# Patient Record
Sex: Male | Born: 1950 | Race: White | Hispanic: No | Marital: Married | Smoking: Never smoker
Health system: Southern US, Community
[De-identification: ages and names within clinical notes are randomized; demographics above are authoritative.]

## PROBLEM LIST (undated history)

## (undated) DIAGNOSIS — E785 Hyperlipidemia, unspecified: Secondary | ICD-10-CM

## (undated) DIAGNOSIS — E119 Type 2 diabetes mellitus without complications: Secondary | ICD-10-CM

## (undated) DIAGNOSIS — G4733 Obstructive sleep apnea (adult) (pediatric): Secondary | ICD-10-CM

## (undated) DIAGNOSIS — K219 Gastro-esophageal reflux disease without esophagitis: Secondary | ICD-10-CM

## (undated) DIAGNOSIS — Z87442 Personal history of urinary calculi: Secondary | ICD-10-CM

## (undated) DIAGNOSIS — F419 Anxiety disorder, unspecified: Secondary | ICD-10-CM

## (undated) DIAGNOSIS — Z9989 Dependence on other enabling machines and devices: Secondary | ICD-10-CM

## (undated) DIAGNOSIS — L409 Psoriasis, unspecified: Secondary | ICD-10-CM

## (undated) DIAGNOSIS — R011 Cardiac murmur, unspecified: Secondary | ICD-10-CM

## (undated) DIAGNOSIS — I10 Essential (primary) hypertension: Secondary | ICD-10-CM

## (undated) DIAGNOSIS — J309 Allergic rhinitis, unspecified: Secondary | ICD-10-CM

## (undated) DIAGNOSIS — R51 Headache: Secondary | ICD-10-CM

## (undated) DIAGNOSIS — M1990 Unspecified osteoarthritis, unspecified site: Secondary | ICD-10-CM

## (undated) DIAGNOSIS — H3530 Unspecified macular degeneration: Secondary | ICD-10-CM

## (undated) DIAGNOSIS — I480 Paroxysmal atrial fibrillation: Secondary | ICD-10-CM

## (undated) DIAGNOSIS — R519 Headache, unspecified: Secondary | ICD-10-CM

## (undated) HISTORY — DX: Gastro-esophageal reflux disease without esophagitis: K21.9

## (undated) HISTORY — PX: CATARACT EXTRACTION: SUR2

## (undated) HISTORY — DX: Essential (primary) hypertension: I10

## (undated) HISTORY — DX: Unspecified osteoarthritis, unspecified site: M19.90

## (undated) HISTORY — DX: Headache, unspecified: R51.9

## (undated) HISTORY — PX: NASAL SINUS SURGERY: SHX719

## (undated) HISTORY — PX: EYE SURGERY: SHX253

## (undated) HISTORY — PX: TONSILLECTOMY: SUR1361

## (undated) HISTORY — DX: Allergic rhinitis, unspecified: J30.9

## (undated) HISTORY — DX: Cardiac murmur, unspecified: R01.1

## (undated) HISTORY — DX: Unspecified macular degeneration: H35.30

## (undated) HISTORY — DX: Obstructive sleep apnea (adult) (pediatric): G47.33

## (undated) HISTORY — PX: VASECTOMY: SHX75

## (undated) HISTORY — DX: Psoriasis, unspecified: L40.9

## (undated) HISTORY — DX: Headache: R51

---

## 1989-11-26 HISTORY — PX: NASAL SINUS SURGERY: SHX719

## 2006-09-20 ENCOUNTER — Ambulatory Visit: Payer: Self-pay | Admitting: Internal Medicine

## 2008-12-28 ENCOUNTER — Ambulatory Visit: Payer: Self-pay | Admitting: Ophthalmology

## 2009-01-11 ENCOUNTER — Ambulatory Visit: Payer: Self-pay | Admitting: Ophthalmology

## 2010-04-11 ENCOUNTER — Ambulatory Visit: Payer: Self-pay | Admitting: Ophthalmology

## 2010-04-12 ENCOUNTER — Ambulatory Visit: Payer: Self-pay | Admitting: Cardiovascular Disease

## 2010-04-24 ENCOUNTER — Ambulatory Visit: Payer: Self-pay | Admitting: Ophthalmology

## 2013-06-08 ENCOUNTER — Ambulatory Visit: Payer: Self-pay | Admitting: General Practice

## 2015-08-28 ENCOUNTER — Other Ambulatory Visit: Payer: Self-pay | Admitting: Physician Assistant

## 2015-09-05 ENCOUNTER — Other Ambulatory Visit: Payer: Self-pay | Admitting: Emergency Medicine

## 2015-09-05 MED ORDER — FLUTICASONE PROPIONATE 50 MCG/ACT NA SUSP: 2.0000 | Freq: Every day | NASAL | Status: DC

## 2015-09-05 NOTE — Telephone Encounter (Signed)
Received faxed med request from [facility] [address] for Fluticasone nose spray.

## 2015-09-05 NOTE — Telephone Encounter (Signed)
Please reorder. Thanks

## 2015-09-19 ENCOUNTER — Ambulatory Visit (INDEPENDENT_AMBULATORY_CARE_PROVIDER_SITE_OTHER): Payer: PRIVATE HEALTH INSURANCE | Admitting: Family Medicine

## 2015-09-19 ENCOUNTER — Encounter: Payer: Self-pay | Admitting: Family Medicine

## 2015-09-19 VITALS — BP 116/82 | HR 74 | Temp 97.9°F | Ht 71.85 in | Wt 254.2 lb

## 2015-09-19 DIAGNOSIS — L4050 Arthropathic psoriasis, unspecified: Secondary | ICD-10-CM | POA: Insufficient documentation

## 2015-09-19 DIAGNOSIS — I10 Essential (primary) hypertension: Secondary | ICD-10-CM | POA: Diagnosis not present

## 2015-09-19 DIAGNOSIS — R0602 Shortness of breath: Secondary | ICD-10-CM | POA: Diagnosis not present

## 2015-09-19 DIAGNOSIS — K219 Gastro-esophageal reflux disease without esophagitis: Secondary | ICD-10-CM | POA: Insufficient documentation

## 2015-09-19 DIAGNOSIS — R0600 Dyspnea, unspecified: Secondary | ICD-10-CM | POA: Diagnosis not present

## 2015-09-19 DIAGNOSIS — R42 Dizziness and giddiness: Secondary | ICD-10-CM

## 2015-09-19 DIAGNOSIS — M2550 Pain in unspecified joint: Secondary | ICD-10-CM

## 2015-09-19 MED ORDER — AMLODIPINE BESYLATE 10 MG PO TABS: 10.0000 mg | ORAL_TABLET | Freq: Every day | ORAL | Status: DC

## 2015-09-19 MED ORDER — OMEPRAZOLE 20 MG PO CPDR: 20.0000 mg | DELAYED_RELEASE_CAPSULE | Freq: Every day | ORAL | Status: DC

## 2015-09-19 NOTE — Progress Notes (Signed)
Pre visit review using our clinic review tool, if applicable. No additional management support is needed unless otherwise documented below in the visit note. 

## 2015-09-19 NOTE — Assessment & Plan Note (Signed)
Well-controlled.  Continue omeprazole. 

## 2015-09-19 NOTE — Assessment & Plan Note (Addendum)
At goal. Continue amlodipine 10 mg daily. We'll check CMET.

## 2015-09-19 NOTE — Progress Notes (Signed)
Patient ID: Wayne Scott, male   DOB: Nov 08, 1951, 64 y.o.   MRN: [id number]  Tommi Rumps, MD Phone: [phone]  Wayne Scott is a 64 y.o. male who presents today for new patient visit  HYPERTENSION Disease Monitoring Home BP Monitoring not checking Chest pain- no    Dyspnea- yes, see below Medications Compliance-  taking amlodipine. Lightheadedness-  yes, see below  Edema- no  Dyspnea: Patient notes for several months he has felt like he has to work to breathe intermittently. He notes it occurs when he goes from seated to standing and gets worse after eating. He does not get short of breath with exercise. He feels like it could be worse when he is not having regular bowel movements. He does note some orthopnea. No PND. He occasionally wakes up with the feeling that he hasn't been breathing well at night. He has no history of VTE, anemia, or thyroid issues. He does note a history of a stress test 2-3 years ago that he reports was normal. He does note he has obstructive sleep apnea and wears CPAP nightly. He has no chest pain, dyspnea, or lightheadedness at this time. He denies lower extremity swelling.  Lightheadedness: Patient notes for a long time he has had an issue with lightheadedness that only occurs either after eating or when he waits too long to eat. He does note some intermittent palpitations with this that he describes as a rapid heartbeat. He has no chest pain or shortness of breath with this lightheadedness. He does report a history of prediabetes. These symptoms do not occur when he goes from laying to standing or seated to standing. He has no lightheadedness at this time.  Joint aches: Patient reports persistent scattered joint aches that occur all the time. He cannot pinpoint a specific joint. He notes it feels achy. He denies any joint swelling or erythema. He states he takes 2 Aleve a day for this. Does have a history of psoriasis. His mom does have arthritis. He is  unsure what kind of arthritis she has. He states he has been tested for rheumatoid arthritis and Lyme disease in the past and these were negative.  GERD: this is well controlled. He has no abdominal pain. He has no reflux symptoms or sour taste in the back of his mouth. He takes omeprazole daily. Has not had symptoms in some time.    Active Ambulatory Problems    Diagnosis Date Noted  . Essential hypertension 09/19/2015  . Dyspnea 09/19/2015  . Lightheadedness 09/19/2015  . Joint ache 09/19/2015  . GERD (gastroesophageal reflux disease) 09/19/2015   Resolved Ambulatory Problems    Diagnosis Date Noted  . No Resolved Ambulatory Problems   Past Medical History  Diagnosis Date  . Arthritis   . Headache   . Allergic rhinitis   . Hypertension   . Psoriasis   . OSA (obstructive sleep apnea)   . Macular degeneration     Family History  Problem Relation Age of Onset  . Arthritis      Parent  . Hypertension      Parent  . Diabetes      Parent    Social History   Social History  . Marital Status: Married    Spouse Name: N/A  . Number of Children: N/A  . Years of Education: N/A   Occupational History  . Not on file.   Social History Main Topics  . Smoking status: Never Smoker   . Smokeless tobacco:  Not on file  . Alcohol Use: 0.0 oz/week    0 Standard drinks or equivalent per week  . Drug Use: No  . Sexual Activity: Not on file   Other Topics Concern  . Not on file   Social History Narrative  . 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 CV:  Positive for dyspnea, Negative for chest pain, edema, palpitations Resp: Negative for cough,  hemoptysis GI: Negative for nausea, vomiting, diarrhea, constipation, abdominal pain, melena, hematochezia. GU: Positive for sexual difficulty (has history of ED followed by urology), Negative for dysuria, incontinence, urinary hesitance, hematuria, vaginal or penile discharge, polyuria, lumps in testicle or breasts MSK: Positive for joint pain, Negative for muscle cramps or aches, joint swelling Neuro: Positive for dizziness, Negative for headaches, weakness, numbness, passing out/fainting Psych: Negative for depression, anxiety, memory problems  Objective  Physical Exam Filed Vitals:   09/19/15 0833  BP: 116/82  Pulse: 74  Temp: 97.9 F (36.6 C)   laying blood pressure 114/80 pulse 62 Sitting blood pressure 120/84 pulse 69 Standing blood pressure 128/86 pulse 76  Physical Exam  Constitutional: He is well-developed, well-nourished, and in no distress.  HENT:  Head: Normocephalic and atraumatic.  Right Ear: External ear normal.  Left Ear: External ear normal.  Mouth/Throat: Oropharynx is clear and moist. No oropharyngeal exudate.  Eyes: Conjunctivae are normal. Pupils are equal, round, and reactive to light.  Neck: Neck supple.  Cardiovascular: Normal rate, regular rhythm and normal heart sounds.  Exam reveals no gallop and no friction rub.   No murmur heard. Pulmonary/Chest: Effort normal and breath sounds normal. No respiratory distress. He has no wheezes. He has no rales.  Abdominal: Soft. Bowel sounds are normal. He exhibits no distension. There is no tenderness. There is no rebound and no guarding.  Musculoskeletal: He exhibits no edema.  Lymphadenopathy:    He has no cervical adenopathy.  Neurological: He is alert.  CN 2-12 intact, 5/5 strength in bilateral biceps, triceps, grip, quads, hamstrings, plantar and dorsiflexion, sensation to light touch intact in bilateral UE and LE, normal gait, 2+ patellar reflexes  Skin: Skin is warm and dry. He is not diaphoretic.   Psychiatric: Mood and affect normal.   EKG: Normal sinus rhythm, rate 64, nonspecific ST depression (appears depressed less than 1 mm in V3 and V4 and V5)  Assessment/Plan:   Essential hypertension At goal. Continue amlodipine 10 mg daily. We'll check CMET.  Dyspnea Patient has had several months of dyspnea though no other associated symptoms with this. Could be related to his sleep apnea or deconditioning. Does seem to occur following eating and standing which is atypical. His EKG does have minimal ST depressions though they are less than 1 mm in each instance. He has no symptoms at this time. His vital signs are stable. His lung exam is benign. Unlikely to be an acute pulmonary process or PE given stable vitals and benign lung exam. Given these EKG changes and his reported dyspnea we will refer him to cardiology for further evaluation. If cardiac workup is negative could benefit from seeing pulmonary for evaluation and management of his sleep apnea. He was given return precautions.  Lightheadedness Patient  with lightheadedness that is associated with eating or times when he has not eaten in a while. He had negative orthostatics in the office. His blood pressures in the normal range today. He is neurologically intact today. His vitals are stable. The fact that this is associated with eating or having gone too long without eating makes me wonder if it could be related to his blood glucose. We will check an A1c and CMP. We'll also check a TSH and CBC rule out thyroid and anemia as a cause of this. He will additionally see cardiology.   Joint ache Patient was scattered joint aches reported today. He is unable to pinpoint specific joints that hurt. He notes all his joints hurt. We'll obtain lab work to evaluate this further. Patient notes he has a lab appointment on Wednesday through his wife's work where he had labs for free at [facility] and thus he is given a prescription for a CBC, TSH,  CMP, sedimentation rate, A1c, CRP, HLA-B27, RF, and ANA. He has psoriasis which places him at risk for psoriatic arthritis. We will see what his lab work reveals and then proceed from there. Given return precautions.  GERD (gastroesophageal reflux disease) Well-controlled. Continue omeprazole.  Patient was given a prescription for lab work as outlined above. He'll obtain this on Wednesday.  Orders Placed This Encounter  Procedures  . Ambulatory referral to Cardiology    Referral Priority:  Routine    Referral Type:  Consultation    Referral Reason:  Specialty Services Required    Requested Specialty:  Cardiology    Number of Visits Requested:  1  . EKG 12-Lead    Tommi Rumps

## 2015-09-19 NOTE — Assessment & Plan Note (Addendum)
Patient with lightheadedness that is associated with eating or times when he has not eaten in a while. He had negative orthostatics in the office. His blood pressures in the normal range today. He is neurologically intact today. His vitals are stable. The fact that this is associated with eating or having gone too long without eating makes me wonder if it could be related to his blood glucose. We will check an A1c and CMP. We'll also check a TSH and CBC rule out thyroid and anemia as a cause of this. He will additionally see cardiology. Given return precautions.

## 2015-09-19 NOTE — Assessment & Plan Note (Signed)
Patient has had several months of dyspnea though no other associated symptoms with this. Could be related to his sleep apnea or deconditioning. Does seem to occur following eating and standing which is atypical. His EKG does have minimal ST depressions though they are less than 1 mm in each instance. He has no symptoms at this time. His vital signs are stable. His lung exam is benign. Unlikely to be an acute pulmonary process or PE given stable vitals and benign lung exam. Given these EKG changes and his reported dyspnea we will refer him to cardiology for further evaluation. If cardiac workup is negative could benefit from seeing pulmonary for evaluation and management of his sleep apnea. He was given return precautions.

## 2015-09-19 NOTE — Patient Instructions (Addendum)
Nice to meet you. Please have blood work drawn on Wednesday please make sure that they do a CBC, CMET, and TSH and an A1c. Should also obtain an ESR, CRP, HLA-B 27, RF, and ANA. We will refill your blood pressure and reflux medicines. We will check your blood sugar with this to ensure that you're not diabetic and that this is not leading to lightheadedness. We will refer you to cardiology for further evaluation of shortness of breath. If you develop chest pain, shortness of breath, palpitations, lightheadedness, dizziness, change in vision, or fever please seek medical attention.

## 2015-09-19 NOTE — Assessment & Plan Note (Addendum)
Patient was scattered joint aches reported today. He is unable to pinpoint specific joints that hurt. He notes all his joints hurt. We'll obtain lab work to evaluate this further. Patient notes he has a lab appointment on Wednesday through his wife's work where he had labs for free at [facility] and thus he is given a prescription for a CBC, TSH, CMP, sedimentation rate, A1c, CRP, HLA-B27, RF, and ANA. He has psoriasis which places him at risk for psoriatic arthritis. We will see what his lab work reveals and then proceed from there. Given return precautions.

## 2015-09-21 ENCOUNTER — Other Ambulatory Visit: Payer: Self-pay | Admitting: Physician Assistant

## 2015-09-21 ENCOUNTER — Other Ambulatory Visit: Payer: Self-pay | Admitting: Family Medicine

## 2015-09-21 DIAGNOSIS — Z0000 Encounter for general adult medical examination without abnormal findings: Secondary | ICD-10-CM

## 2015-09-27 LAB — CMP12+LP+TP+TSH+6AC+PSA+CBC…
ALT: 26 IU/L (ref 0–44)
AST: 21 IU/L (ref 0–40)
Albumin/Globulin Ratio: 1.8 (ref 1.1–2.5)
Albumin: 4.4 g/dL (ref 3.6–4.8)
Alkaline Phosphatase: 93 IU/L (ref 39–117)
BUN/Creatinine Ratio: 12 (ref 10–22)
BUN: 10 mg/dL (ref 8–27)
Basophils Absolute: 0.1 10*3/uL (ref 0.0–0.2)
Basos: 1 %
Bilirubin Total: 0.5 mg/dL (ref 0.0–1.2)
Calcium: 9.2 mg/dL (ref 8.6–10.2)
Chloride: 102 mmol/L (ref 97–106)
Chol/HDL Ratio: 2.6 ratio units (ref 0.0–5.0)
Cholesterol, Total: 201 mg/dL — ABNORMAL HIGH (ref 100–199)
Creatinine, Ser: 0.85 mg/dL (ref 0.76–1.27)
EOS (ABSOLUTE): 0.2 10*3/uL (ref 0.0–0.4)
Eos: 2 %
Estimated CHD Risk: <0.5 times avg. (ref 0.0–1.0)
Free Thyroxine Index: 2.3 (ref 1.2–4.9)
GFR calc Af Amer: 106 mL/min/{1.73_m2} (ref >59)
GFR calc non Af Amer: 92 mL/min/{1.73_m2} (ref >59)
GGT: 21 IU/L (ref 0–65)
Globulin, Total: 2.4 g/dL (ref 1.5–4.5)
Glucose: 101 mg/dL — ABNORMAL HIGH (ref 65–99)
HDL: 78 mg/dL (ref >39)
Hematocrit: 47.7 % (ref 37.5–51.0)
Hemoglobin: 16.5 g/dL (ref 12.6–17.7)
Immature Grans (Abs): 0 10*3/uL (ref 0.0–0.1)
Immature Granulocytes: 0 %
Iron: 75 ug/dL (ref 38–169)
LDH: 159 IU/L (ref 121–224)
LDL Calculated: 111 mg/dL — ABNORMAL HIGH (ref 0–99)
Lymphocytes Absolute: 2.5 10*3/uL (ref 0.7–3.1)
Lymphs: 32 %
MCH: 31.8 pg (ref 26.6–33.0)
MCHC: 34.6 g/dL (ref 31.5–35.7)
MCV: 92 fL (ref 79–97)
Monocytes Absolute: 0.8 10*3/uL (ref 0.1–0.9)
Monocytes: 10 %
Neutrophils Absolute: 4.3 10*3/uL (ref 1.4–7.0)
Neutrophils: 55 %
Phosphorus: 3.4 mg/dL (ref 2.5–4.5)
Platelets: 207 10*3/uL (ref 150–379)
Potassium: 4.3 mmol/L (ref 3.5–5.2)
Prostate Specific Ag, Serum: 0.2 ng/mL (ref 0.0–4.0)
RBC: 5.19 x10E6/uL (ref 4.14–5.80)
RDW: 13.7 % (ref 12.3–15.4)
Sodium: 144 mmol/L (ref 136–144)
T3 Uptake Ratio: 25 % (ref 24–39)
T4, Total: 9.1 ug/dL (ref 4.5–12.0)
TSH: 0.905 u[IU]/mL (ref 0.450–4.500)
Total Protein: 6.8 g/dL (ref 6.0–8.5)
Triglycerides: 59 mg/dL (ref 0–149)
Uric Acid: 6.3 mg/dL (ref 3.7–8.6)
VLDL Cholesterol Cal: 12 mg/dL (ref 5–40)
WBC: 7.8 10*3/uL (ref 3.4–10.8)

## 2015-09-27 LAB — HGB A1C W/O EAG: Hgb A1c MFr Bld: 5.8 % — ABNORMAL HIGH (ref 4.8–5.6)

## 2015-09-27 LAB — SEDIMENTATION RATE: Sed Rate: 2 mm/hr (ref 0–30)

## 2015-09-27 LAB — C-REACTIVE PROTEIN: CRP: 1.8 mg/L (ref 0.0–4.9)

## 2015-09-27 LAB — ANA: Anti Nuclear Antibody(ANA): NEGATIVE

## 2015-09-27 LAB — HLA-B27 ANTIGEN: HLA B27: NEGATIVE

## 2015-09-27 LAB — RHEUMATOID FACTOR: Rhuematoid fact SerPl-aCnc: <10 IU/mL (ref 0.0–13.9)

## 2015-10-10 ENCOUNTER — Telehealth: Payer: Self-pay | Admitting: *Deleted

## 2015-10-10 ENCOUNTER — Telehealth: Payer: Self-pay | Admitting: Physician Assistant

## 2015-10-10 NOTE — Telephone Encounter (Signed)
Patient wanted to follow with his lab results ,  [facility] Please call patient if results were not faxed over

## 2015-10-10 NOTE — Telephone Encounter (Signed)
Please advise 

## 2015-10-10 NOTE — Telephone Encounter (Signed)
Attempted to call patient. There is no answer. I left a voicemail asking the patient to call back to the office. We'll await his call back to discuss his blood work.

## 2015-10-11 NOTE — Telephone Encounter (Signed)
Patient called back. Discussed his lab work with him. Advised that he is prediabetic. Also discussed his cholesterol being mildly elevated and this in combination with his hypertension treated with medication places him at increased risk for cardiovascular events in the future. He would benefit from a statin. Advised of risk factors of statin. Advised that we would likely start him on atorvastatin. He notes he wants to research this a little bit prior to deciding on this. He was advised on diet. He is advised that he would need to start exercising, though he was advised to not start this until he saw the cardiologist next week. He was advised that the rest of his labs were within normal limits.

## 2015-10-12 DIAGNOSIS — H353222 Exudative age-related macular degeneration, left eye, with inactive choroidal neovascularization: Secondary | ICD-10-CM | POA: Diagnosis not present

## 2015-10-19 ENCOUNTER — Encounter (INDEPENDENT_AMBULATORY_CARE_PROVIDER_SITE_OTHER): Payer: Self-pay

## 2015-10-19 ENCOUNTER — Ambulatory Visit (INDEPENDENT_AMBULATORY_CARE_PROVIDER_SITE_OTHER): Payer: Medicare Other | Admitting: Cardiovascular Disease

## 2015-10-19 ENCOUNTER — Encounter: Payer: Self-pay | Admitting: Cardiovascular Disease

## 2015-10-19 ENCOUNTER — Ambulatory Visit: Payer: PRIVATE HEALTH INSURANCE | Admitting: Family Medicine

## 2015-10-19 VITALS — BP 120/74 | HR 64 | Ht 72.0 in | Wt 253.2 lb

## 2015-10-19 DIAGNOSIS — R0600 Dyspnea, unspecified: Secondary | ICD-10-CM

## 2015-10-19 DIAGNOSIS — R0602 Shortness of breath: Secondary | ICD-10-CM

## 2015-10-19 DIAGNOSIS — R9431 Abnormal electrocardiogram [ECG] [EKG]: Secondary | ICD-10-CM

## 2015-10-19 DIAGNOSIS — I10 Essential (primary) hypertension: Secondary | ICD-10-CM | POA: Diagnosis not present

## 2015-10-19 DIAGNOSIS — R42 Dizziness and giddiness: Secondary | ICD-10-CM

## 2015-10-19 NOTE — Progress Notes (Signed)
Patient ID: NEJI SEITZ, male    DOB: 25-Sep-1951, 64 y.o.   MRN: [id number]  HPI Comments: Mr. Norquist is a pleasant 64 year old gentleman with a history of obesity, sleep apnea, on CPAP, who presents for evaluation of shortness of breath, abnormal EKG.  He reports that at baseline he feels well, no regular exercise program but able to exert himself without any symptoms. He does most of his ADLs, work around the house with no significant problems. He does have breathing problems if he eats too much, also gets lightheaded. Tends to have 3 large meals. Weight has been trending up over the past several years, 10 pounds recently. He feels much of his symptoms is secondary to his weight gain.  No recent evaluation of his CPAP or settings.  Lab work reviewed with him showing total cholesterol 201, LDL 111, hemoglobin A1c 5.8  Father died at age 71, etiology unclear. Mother in good health  EKG on today's visit shows normal sinus rhythm with rate 64 bpm, no significant ST or T-wave changes  Prior stress test reviewed with him 06/08/2013; essentially normal study, good exercise tolerance, no EKG changes at stress or rest concerning for ischemia  In terms of his risk factors, he reports no smoking history, no diabetes   No Known Allergies  Current Outpatient Prescriptions on File Prior to Visit  Medication Sig Dispense Refill  . amLODipine (NORVASC) 10 MG tablet Take 1 tablet (10 mg total) by mouth daily. 90 tablet 1  . fluticasone (FLONASE) 50 MCG/ACT nasal spray Place 2 sprays into both nostrils daily. (Patient taking differently: Place 2 sprays into both nostrils as needed. ) 16 g 6  . mometasone (ELOCON) 0.1 % cream APPLY TO AFFECTED AREA EVERY DAY  6  . omeprazole (PRILOSEC) 20 MG capsule Take 1 capsule (20 mg total) by mouth daily. 90 capsule 1  . zolpidem (AMBIEN) 10 MG tablet Take 10 mg by mouth at bedtime as needed for sleep.     No current facility-administered medications on  file prior to visit.    Past Medical History  Diagnosis Date  . Arthritis   . Headache   . GERD (gastroesophageal reflux disease)   . Allergic rhinitis   . Hypertension   . Psoriasis   . OSA (obstructive sleep apnea)   . Macular degeneration   . Heart murmur     Past Surgical History  Procedure Laterality Date  . Tonsillectomy    . Cataract extraction      Social History  reports that he has never smoked. He does not have any smokeless tobacco history on file. He reports that he drinks alcohol. He reports that he does not use illicit drugs.  Family History family history includes Arthritis in an other family member; Diabetes in an other family member; Hypertension in an other family member.   Review of Systems  Constitutional: Negative.   Respiratory: Positive for shortness of breath.        Shortness of breath at rest  Cardiovascular: Negative.   Gastrointestinal: Negative.   Musculoskeletal: Negative.   Neurological: Positive for light-headedness.  Hematological: Negative.   Psychiatric/Behavioral: Negative.   All other systems reviewed and are negative.   BP 120/74 mmHg  Pulse 64  Ht 6' (1.829 m)  Wt 253 lb 4 oz (114.873 kg)  BMI 34.34 kg/m2  Physical Exam  Constitutional: He is oriented to person, place, and time. He appears well-developed and well-nourished.  Obese  HENT:  Head: Normocephalic.  Nose: Nose normal.  Mouth/Throat: Oropharynx is clear and moist.  Eyes: Conjunctivae are normal. Pupils are equal, round, and reactive to light.  Neck: Normal range of motion. Neck supple. No JVD present.  Cardiovascular: Normal rate, regular rhythm, normal heart sounds and intact distal pulses.  Exam reveals no gallop and no friction rub.   No murmur heard. Pulmonary/Chest: Effort normal and breath sounds normal. No respiratory distress. He has no wheezes. He has no rales. He exhibits no tenderness.  Abdominal: Soft. Bowel sounds are normal. He exhibits no  distension. There is no tenderness.  Musculoskeletal: Normal range of motion. He exhibits no edema or tenderness.  Lymphadenopathy:    He has no cervical adenopathy.  Neurological: He is alert and oriented to person, place, and time. Coordination normal.  Skin: Skin is warm and dry. No rash noted. No erythema.  Psychiatric: He has a normal mood and affect. His behavior is normal. Judgment and thought content normal.

## 2015-10-19 NOTE — Assessment & Plan Note (Addendum)
Atypical shortness of breath, worse after eating. No significant risk factors for coronary artery disease apart from mildly elevated cholesterol We spent some of the visit discussing ways of ruling out ischemia. Could do routine treadmill testing. He does not have any symptoms on exertion and this would likely look normal. Would possibly benefit from CT coronary calcium score. This was discussed with him, he will research it and let us know if he would like to have this done. A elevated score would guide [place] to treat his cholesterol more aggressively, low score we would push for exercise and weight loss

## 2015-10-19 NOTE — Assessment & Plan Note (Signed)
Blood pressure is well controlled on today's visit. No changes made to the medications. 

## 2015-10-19 NOTE — Patient Instructions (Addendum)
You are doing well. No medication changes were made.  Consider a CT coronary calcium score Cost is $150 Done in [place], Call to schedule  Please call us if you have new issues that need to be addressed before your next appt.    Coronary Calcium Scan A coronary calcium scan is an imaging test used to look for deposits of calcium and other fatty materials (plaques) in the inner lining of the blood vessels of your heart (coronary arteries). These deposits of calcium and plaques can partly clog and narrow the coronary arteries without producing any symptoms or warning signs. This puts you at risk for a heart attack. This test can detect these deposits before symptoms develop.  LET [facility] CARE PROVIDER KNOW ABOUT:  Any allergies you have.  All medicines you are taking, including vitamins, herbs, eye drops, creams, and over-the-counter medicines.  Previous problems you or members of your family have had with the use of anesthetics.  Any blood disorders you have.  Previous surgeries you have had.  Medical conditions you have.  Possibility of pregnancy, if this applies. RISKS AND COMPLICATIONS Generally, this is a safe procedure. However, as with any procedure, complications can occur. This test involves the use of radiation. Radiation exposure can be dangerous to a pregnant woman and her unborn baby. If you are pregnant, you should not have this procedure done.  BEFORE THE PROCEDURE There is no special preparation for the procedure. PROCEDURE  You will need to undress and put on a hospital gown. You will need to remove any jewelry around your neck or chest.  Sticky electrodes are placed on your chest and are connected to an electrocardiogram (EKG or electrocardiography) machine to recorda tracing of the electrical activity of your heart.  A CT scanner will take pictures of your heart. During this time, you will be asked to lie still and hold your breath for 2-3 seconds while  a picture is being taken of your heart. AFTER THE PROCEDURE   You will be allowed to get dressed.  You can return to your normal activities after the scan is done.   This information is not intended to replace advice given to you by your health care provider. Make sure you discuss any questions you have with your health care provider.   Document Released: 05/10/2008 Document Revised: 11/17/2013 Document Reviewed: 07/20/2013 Elsevier Interactive Patient Education [employer].

## 2015-10-19 NOTE — Assessment & Plan Note (Signed)
In general does not have any routine lightheadedness, sometimes after he eats.. Recommended he try to avoid large meals. Also suggested he try to check his blood pressure periodically, stay hydrated, stand slowly after large meal If symptoms persist, could cut the amlodipine down to 5 mg daily

## 2015-10-24 DIAGNOSIS — N5201 Erectile dysfunction due to arterial insufficiency: Secondary | ICD-10-CM | POA: Diagnosis not present

## 2015-11-02 DIAGNOSIS — H353211 Exudative age-related macular degeneration, right eye, with active choroidal neovascularization: Secondary | ICD-10-CM | POA: Diagnosis not present

## 2015-11-03 ENCOUNTER — Ambulatory Visit: Payer: PRIVATE HEALTH INSURANCE | Admitting: Cardiovascular Disease

## 2015-11-07 DIAGNOSIS — N5201 Erectile dysfunction due to arterial insufficiency: Secondary | ICD-10-CM | POA: Diagnosis not present

## 2015-11-08 ENCOUNTER — Other Ambulatory Visit: Payer: Self-pay

## 2015-11-08 NOTE — Pre-Procedure Instructions (Signed)
Called pt to do phone interview and pt states that he has cancelled his surgery due to insurance possibly not paying for his surgery- I called Dr Letta Kocher office to verify this and they did say that he has been cancelled

## 2015-11-14 ENCOUNTER — Encounter: Payer: Self-pay | Admitting: Family Medicine

## 2015-11-14 ENCOUNTER — Ambulatory Visit (INDEPENDENT_AMBULATORY_CARE_PROVIDER_SITE_OTHER)
Admission: RE | Admit: 2015-11-14 | Discharge: 2015-11-14 | Disposition: A | Discharge status: Home / Self Care | Payer: Medicare Other | Source: Ambulatory Visit | Attending: Family Medicine | Admitting: Family Medicine

## 2015-11-14 ENCOUNTER — Ambulatory Visit (INDEPENDENT_AMBULATORY_CARE_PROVIDER_SITE_OTHER): Payer: Medicare Other | Admitting: Family Medicine

## 2015-11-14 ENCOUNTER — Ambulatory Visit
Admission: RE | Admit: 2015-11-14 | Discharge: 2015-11-14 | Disposition: A | Discharge status: Home / Self Care | Payer: Medicare Other | Source: Ambulatory Visit | Attending: Family Medicine | Admitting: Family Medicine

## 2015-11-14 VITALS — BP 118/68 | HR 80 | Temp 97.5°F | Ht 72.0 in | Wt 253.6 lb

## 2015-11-14 DIAGNOSIS — M25562 Pain in left knee: Secondary | ICD-10-CM

## 2015-11-14 DIAGNOSIS — M25561 Pain in right knee: Secondary | ICD-10-CM

## 2015-11-14 DIAGNOSIS — M79641 Pain in right hand: Secondary | ICD-10-CM

## 2015-11-14 DIAGNOSIS — M2550 Pain in unspecified joint: Secondary | ICD-10-CM

## 2015-11-14 DIAGNOSIS — M170 Bilateral primary osteoarthritis of knee: Secondary | ICD-10-CM | POA: Diagnosis not present

## 2015-11-14 DIAGNOSIS — M19041 Primary osteoarthritis, right hand: Secondary | ICD-10-CM | POA: Diagnosis not present

## 2015-11-14 DIAGNOSIS — K219 Gastro-esophageal reflux disease without esophagitis: Secondary | ICD-10-CM

## 2015-11-14 DIAGNOSIS — G4733 Obstructive sleep apnea (adult) (pediatric): Secondary | ICD-10-CM

## 2015-11-14 DIAGNOSIS — L409 Psoriasis, unspecified: Secondary | ICD-10-CM

## 2015-11-14 DIAGNOSIS — I10 Essential (primary) hypertension: Secondary | ICD-10-CM

## 2015-11-14 IMAGING — CR DG HAND COMPLETE 3+V*R*
3 series · 3 of 3 positions shown · non-contrast
Comparison: None.

CLINICAL DATA: Right hand pain.  No reported injury.  Psoriasis.

EXAM:
RIGHT HAND - COMPLETE 3+ VIEW

[view not recorded (1 of 3)]
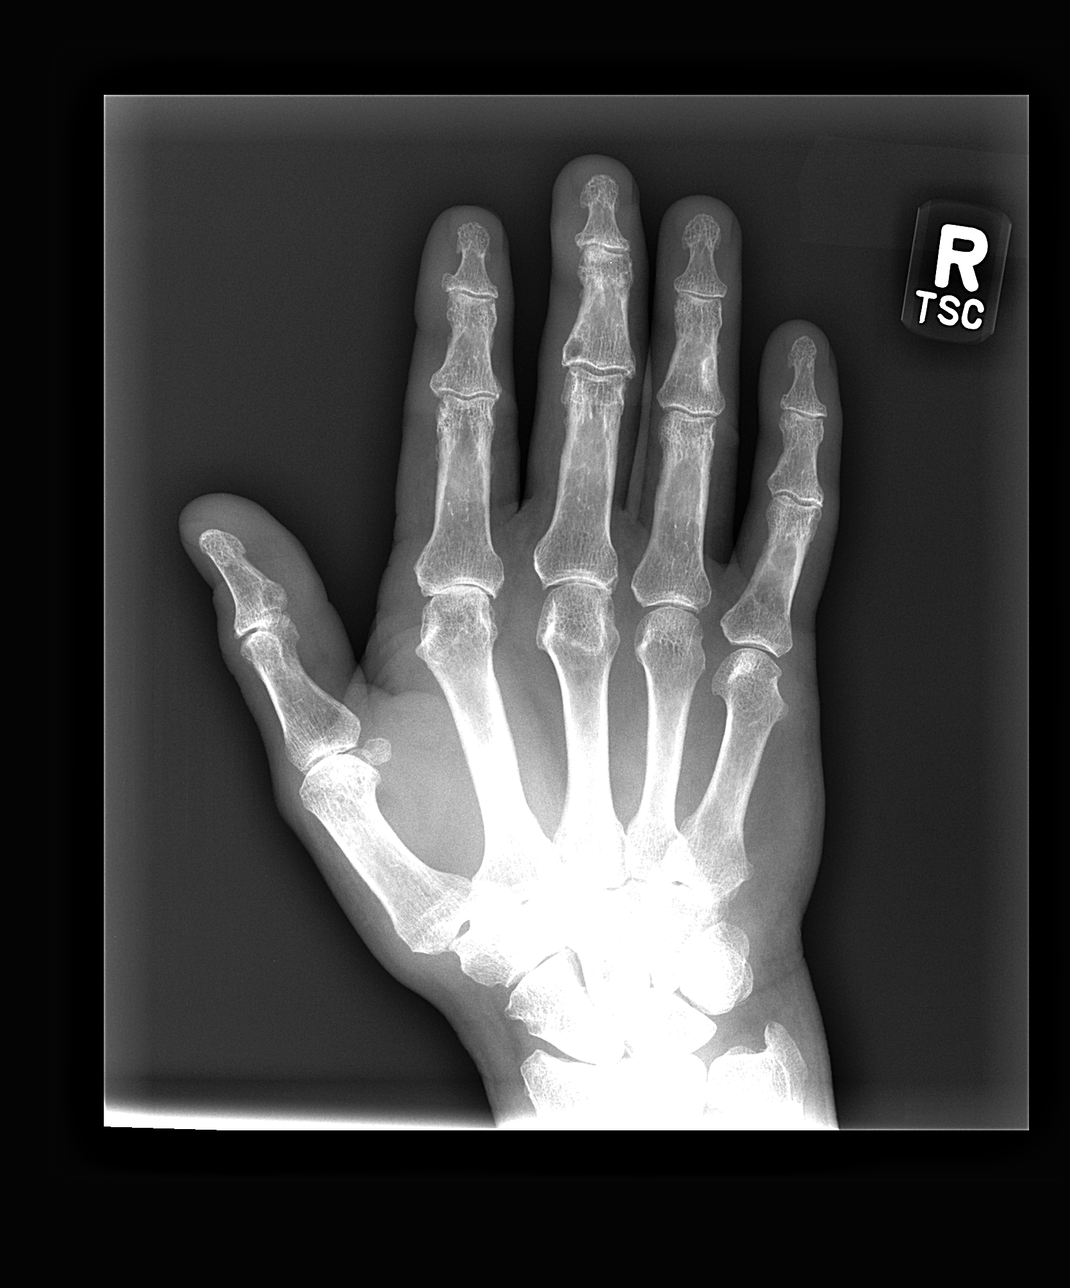

[view not recorded (2 of 3)]
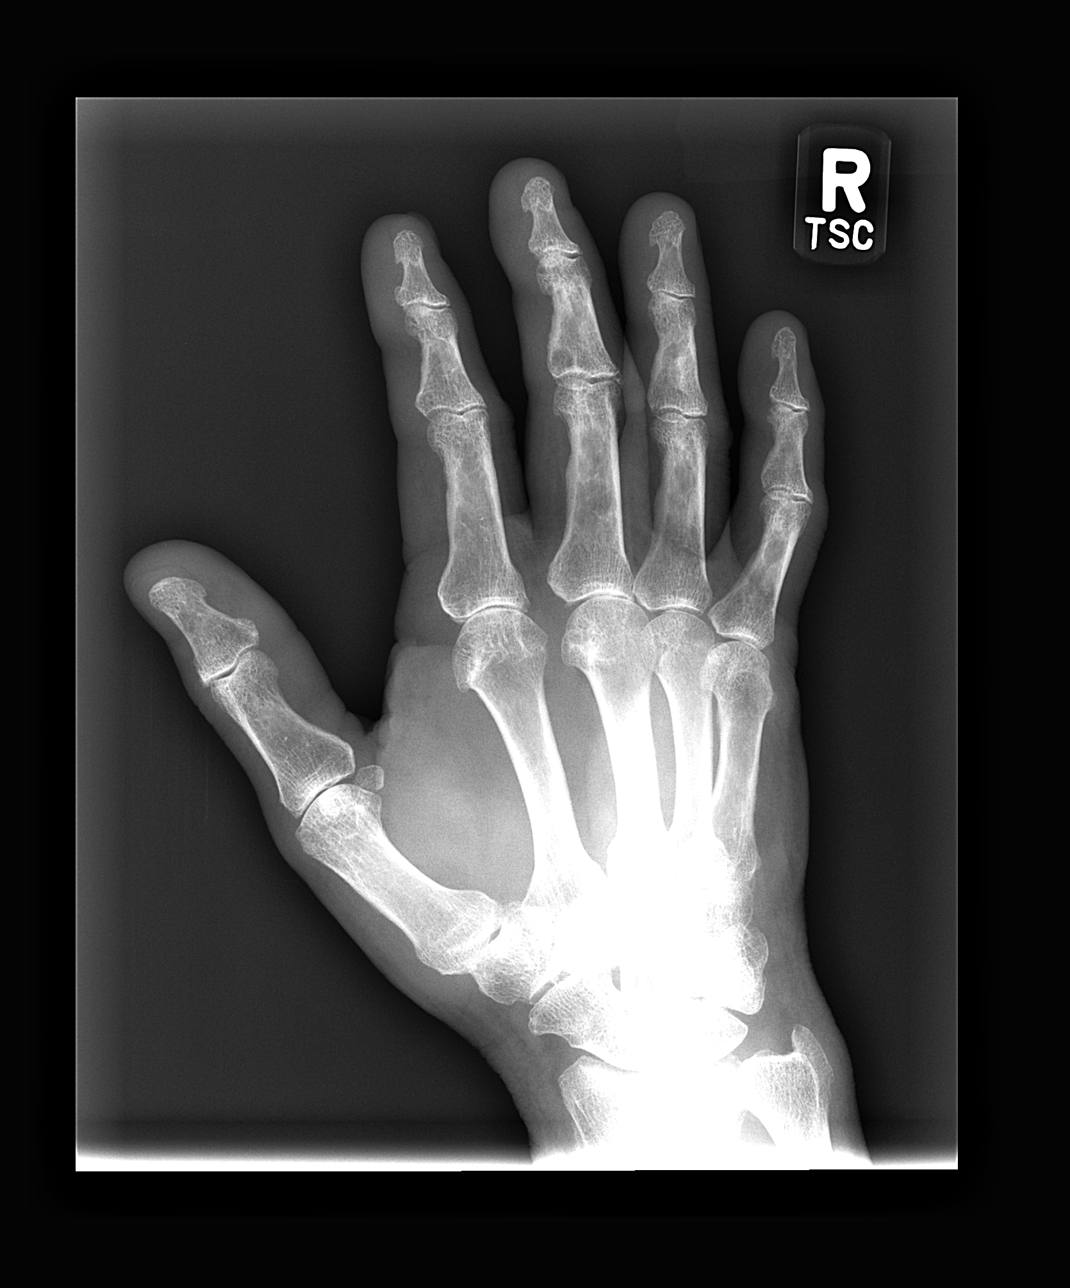

[view not recorded (3 of 3)]
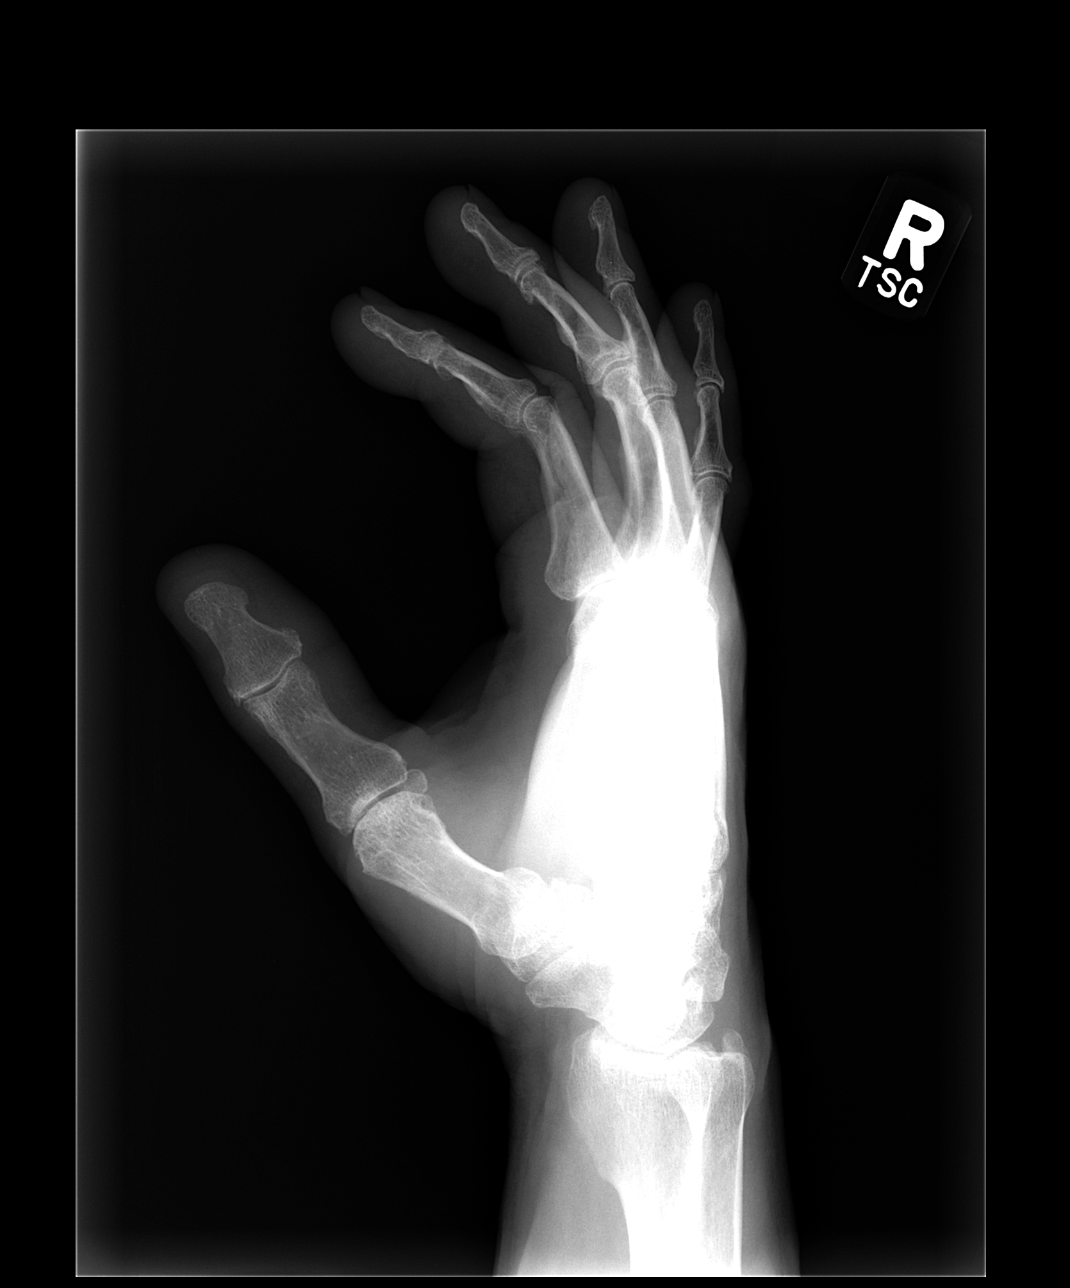

[3 of 3 positions shown; findings below may reference images not displayed]

FINDINGS: No fracture, dislocation or suspicious focal osseous lesion. No
appreciable joint erosions. Moderate osteoarthritis in the second
greater than third metacarpophalangeal joints. Mild-to-moderate
osteoarthritis in the distal interphalangeal joints of the second
and third fingers, in the interphalangeal joint of the right thumb
and in the proximal interphalangeal joint of the right third finger.
IMPRESSION: Mild to moderate polyarticular osteoarthritis in the right hand as
described. No evidence of erosive arthropathy in the right hand.

## 2015-11-14 IMAGING — CR DG KNEE AP/LAT W/ SUNRISE*L*
4 series · 4 of 4 positions shown · non-contrast
Comparison: None.

CLINICAL DATA: Bilateral knee pain for several years. No reported
injury. Psoriasis.

EXAM:
LEFT KNEE 3 VIEWS

[view not recorded (1 of 4)]
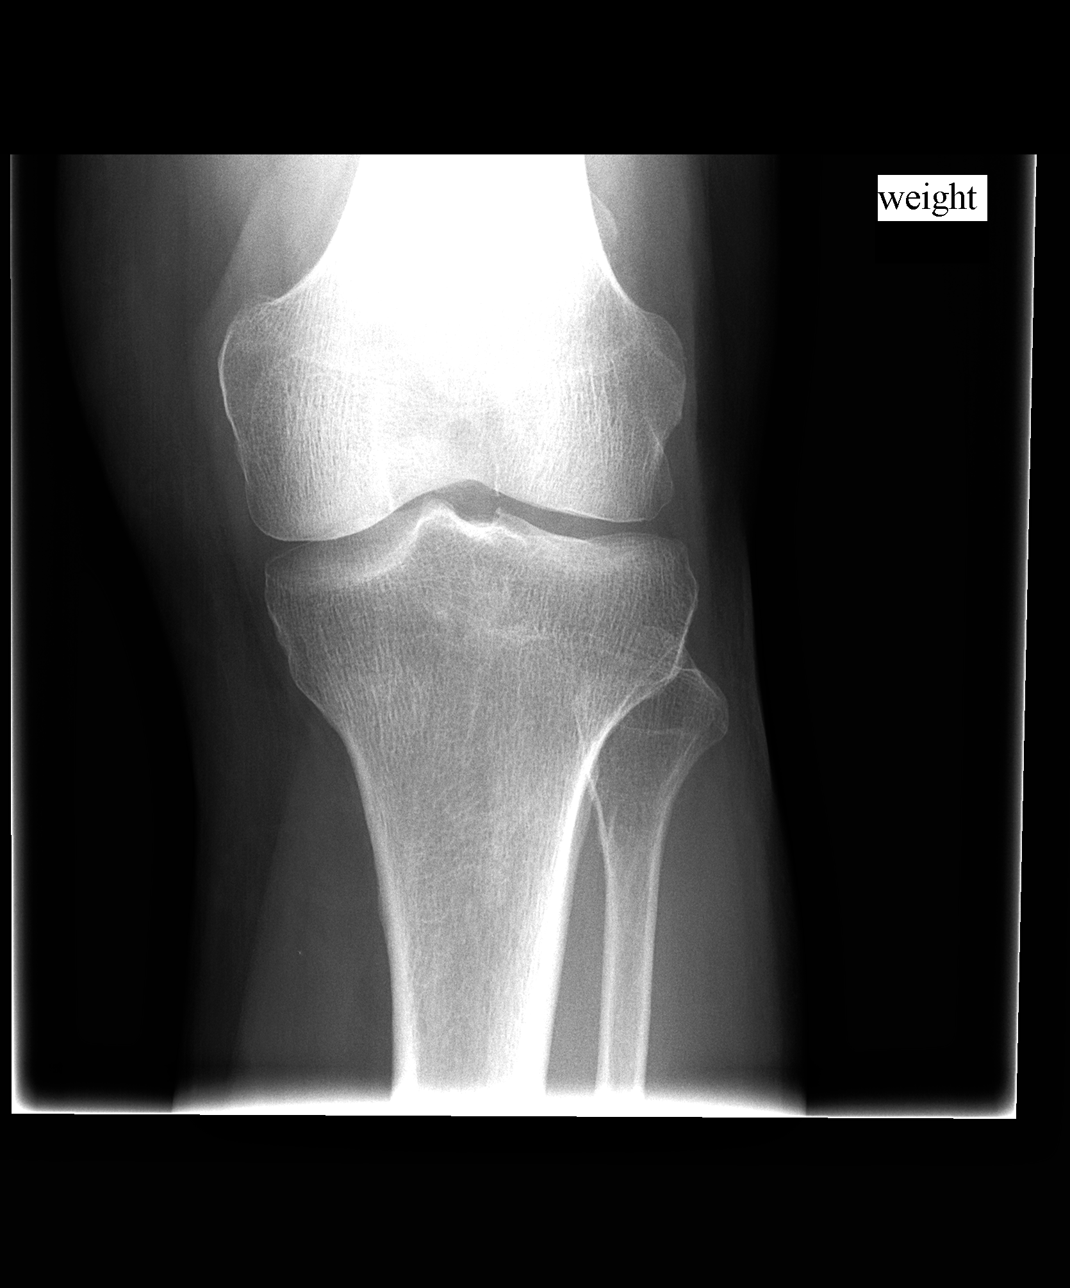

[view not recorded (2 of 4)]
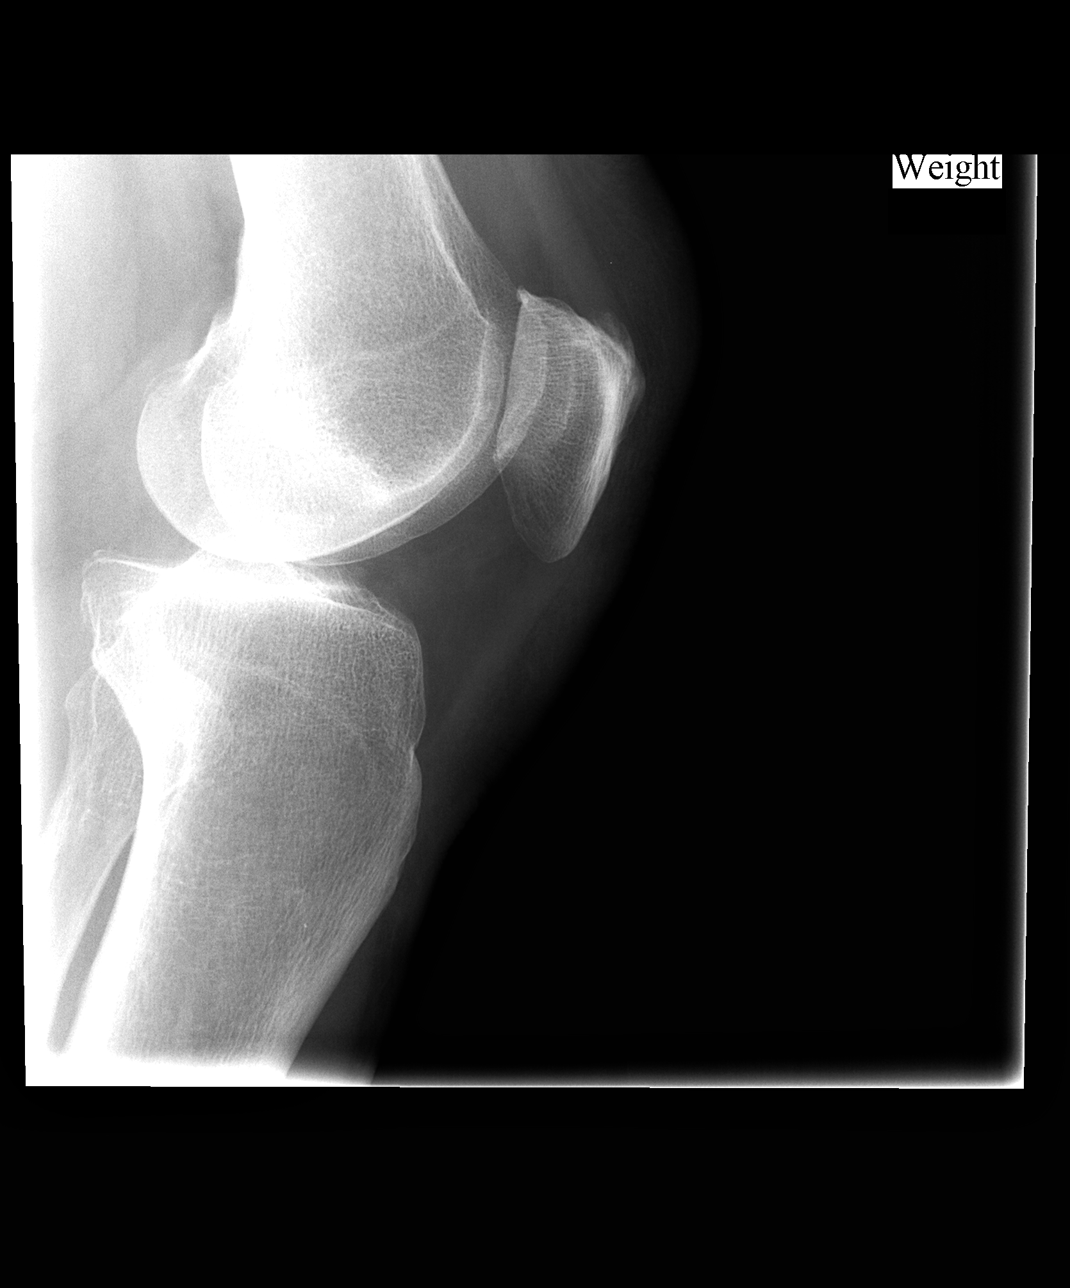

[view not recorded (3 of 4)]
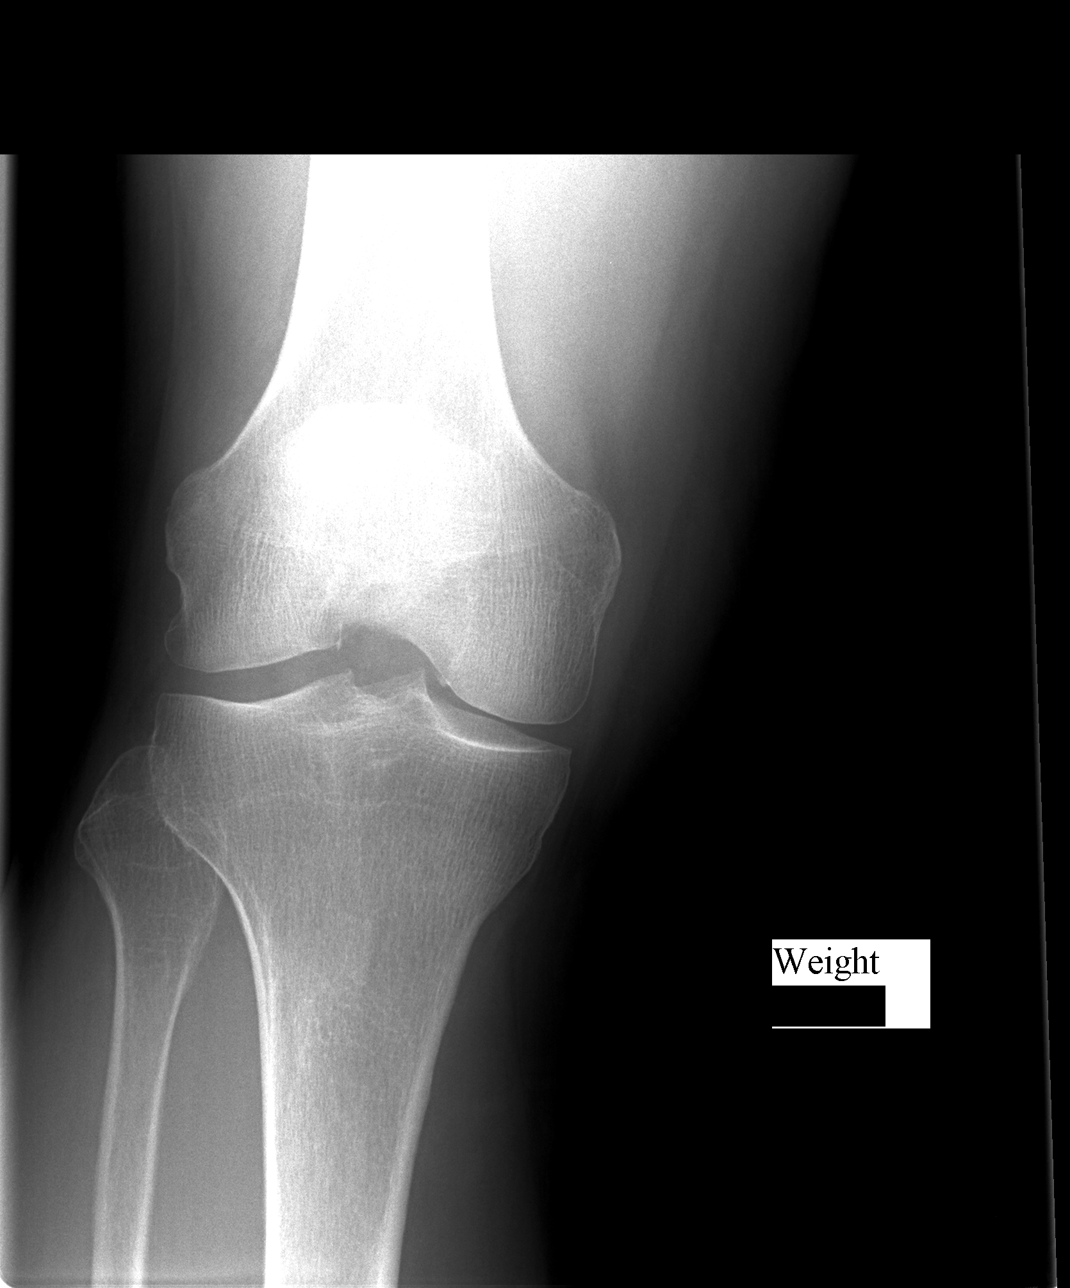

[view not recorded (4 of 4)]
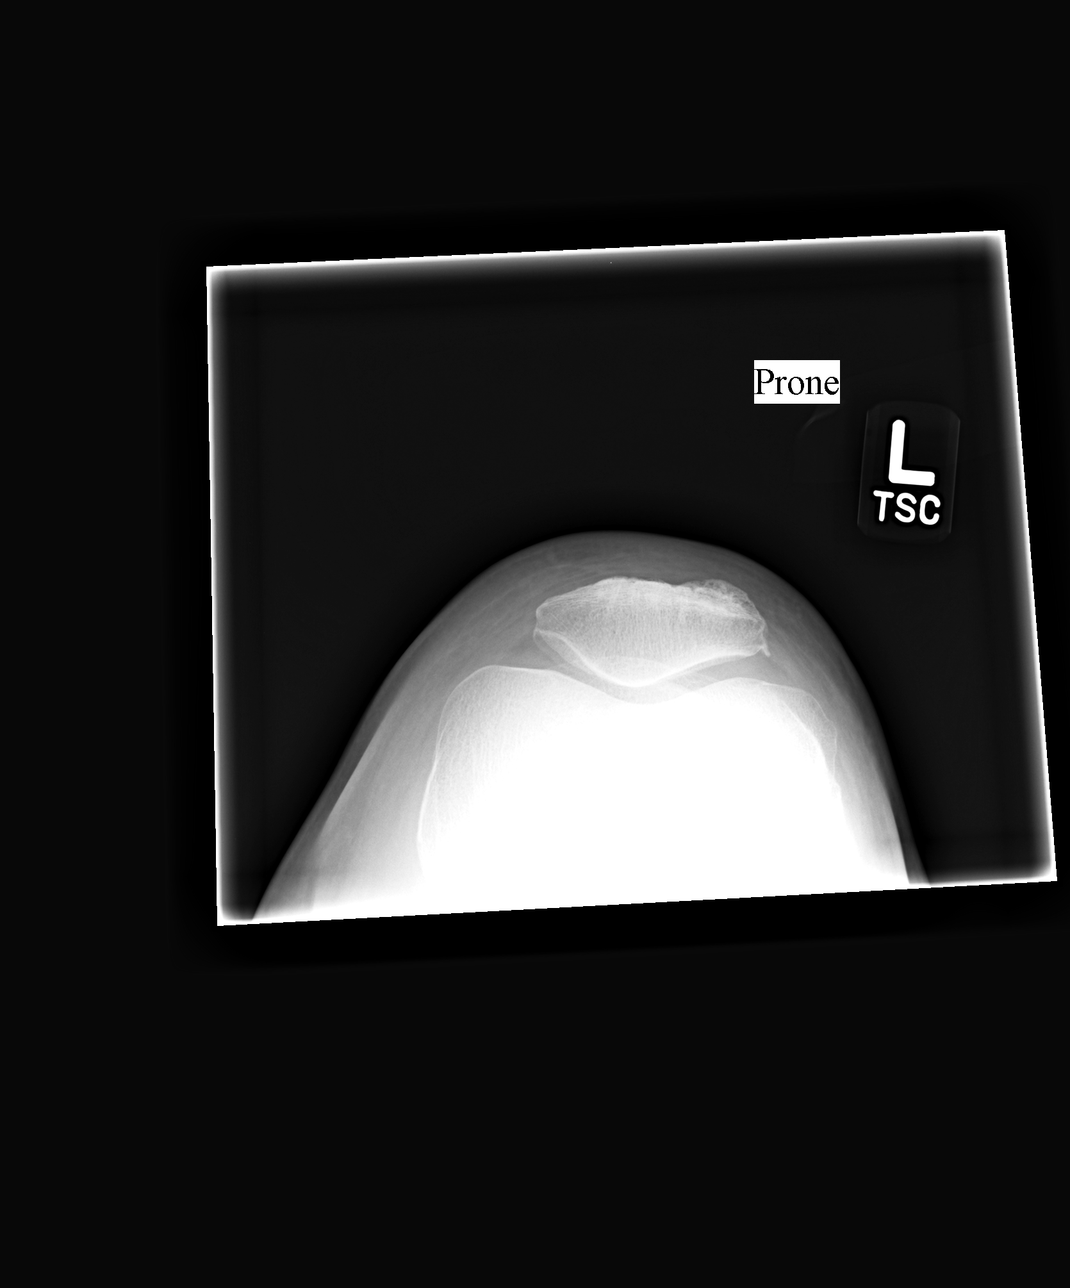

[4 of 4 positions shown; findings below may reference images not displayed]

FINDINGS: No fracture, dislocation, joint effusion or suspicious focal osseous
lesion. Medial and lateral compartments appear preserved. Mild joint
space narrowing throughout the patellofemoral compartment with small
superior and lateral left patellar osteophytes. Small superior left
patellar enthesophyte.
IMPRESSION: Mild patellofemoral compartment osteoarthritis in the left knee
joint.

## 2015-11-14 IMAGING — CR DG KNEE AP/LAT W/ SUNRISE*R*
4 series · 4 of 4 positions shown · non-contrast
Comparison: No prior.

.  CLINICAL DATA:
Knee pain.  No known injury.  Initial evaluation .

EXAM:
RIGHT KNEE 3 VIEWS

[view not recorded (1 of 4)]
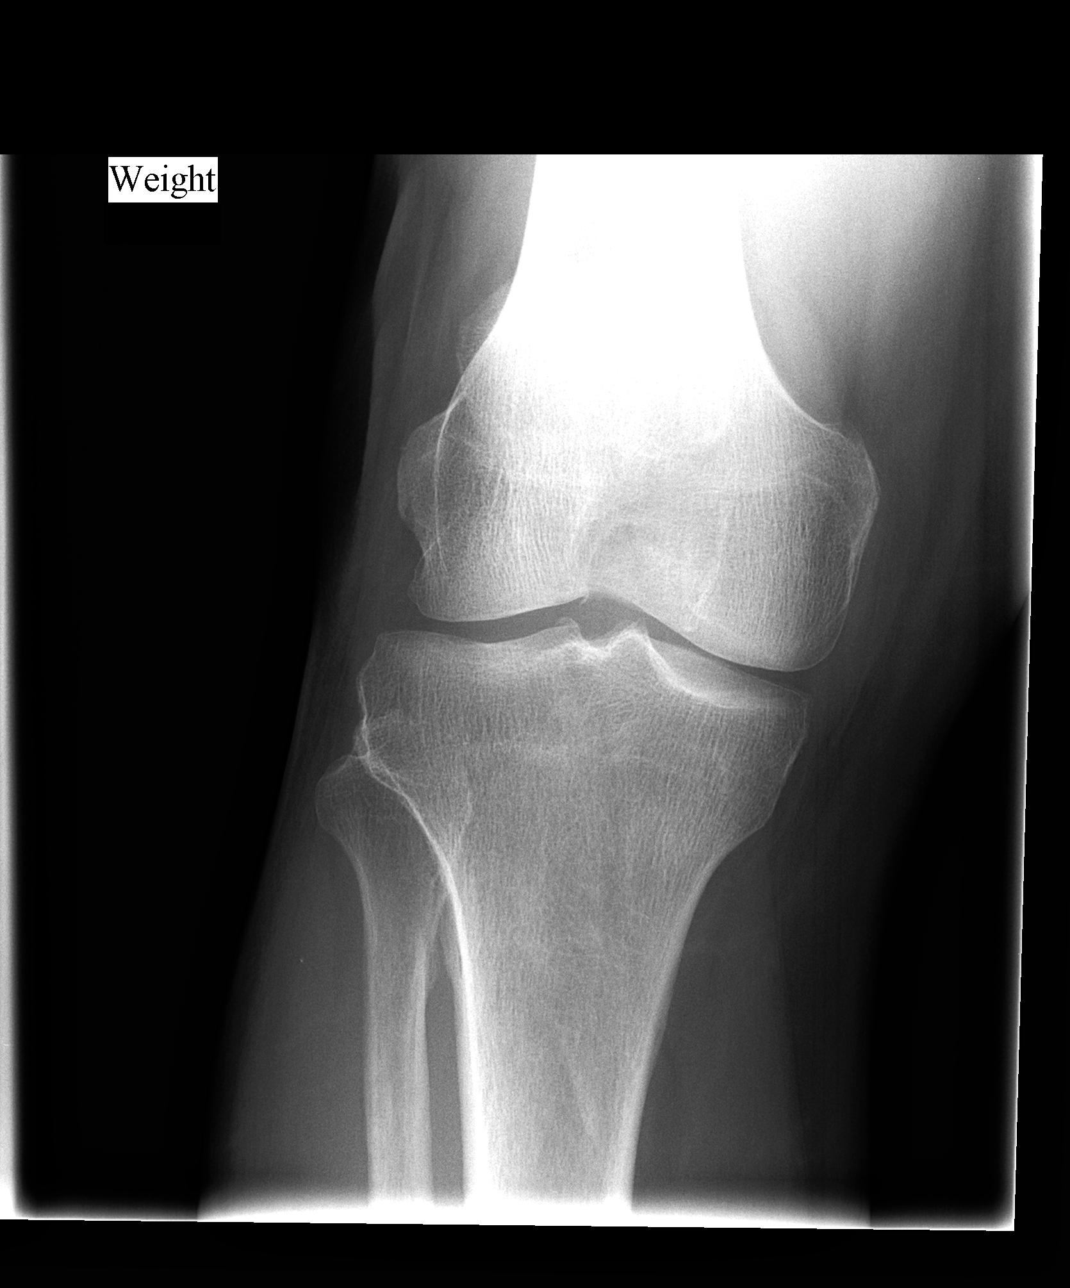

[view not recorded (2 of 4)]
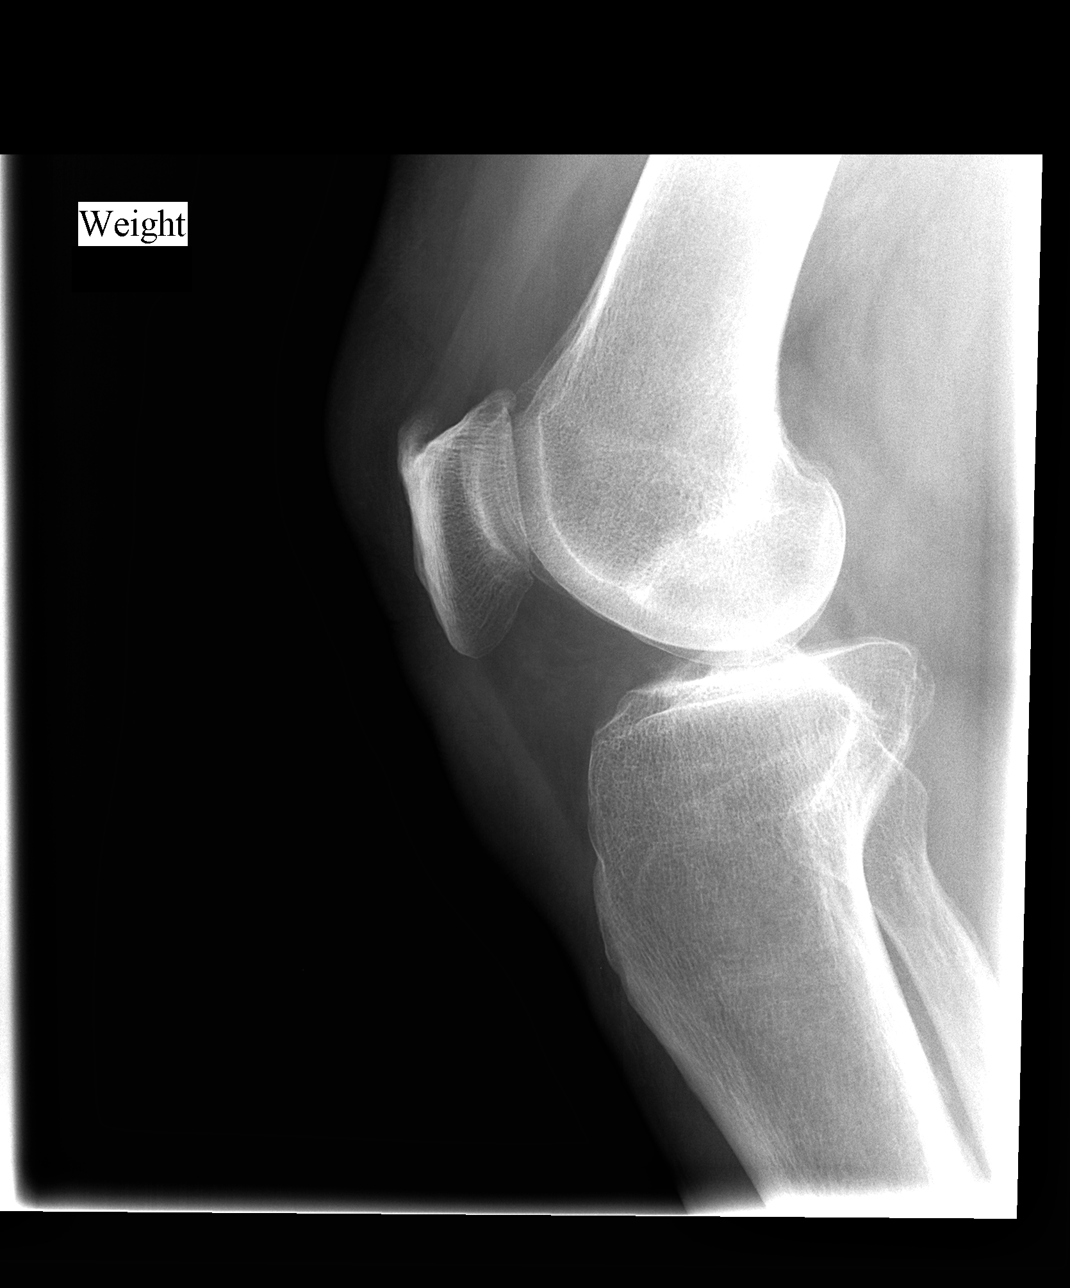

[view not recorded (3 of 4)]
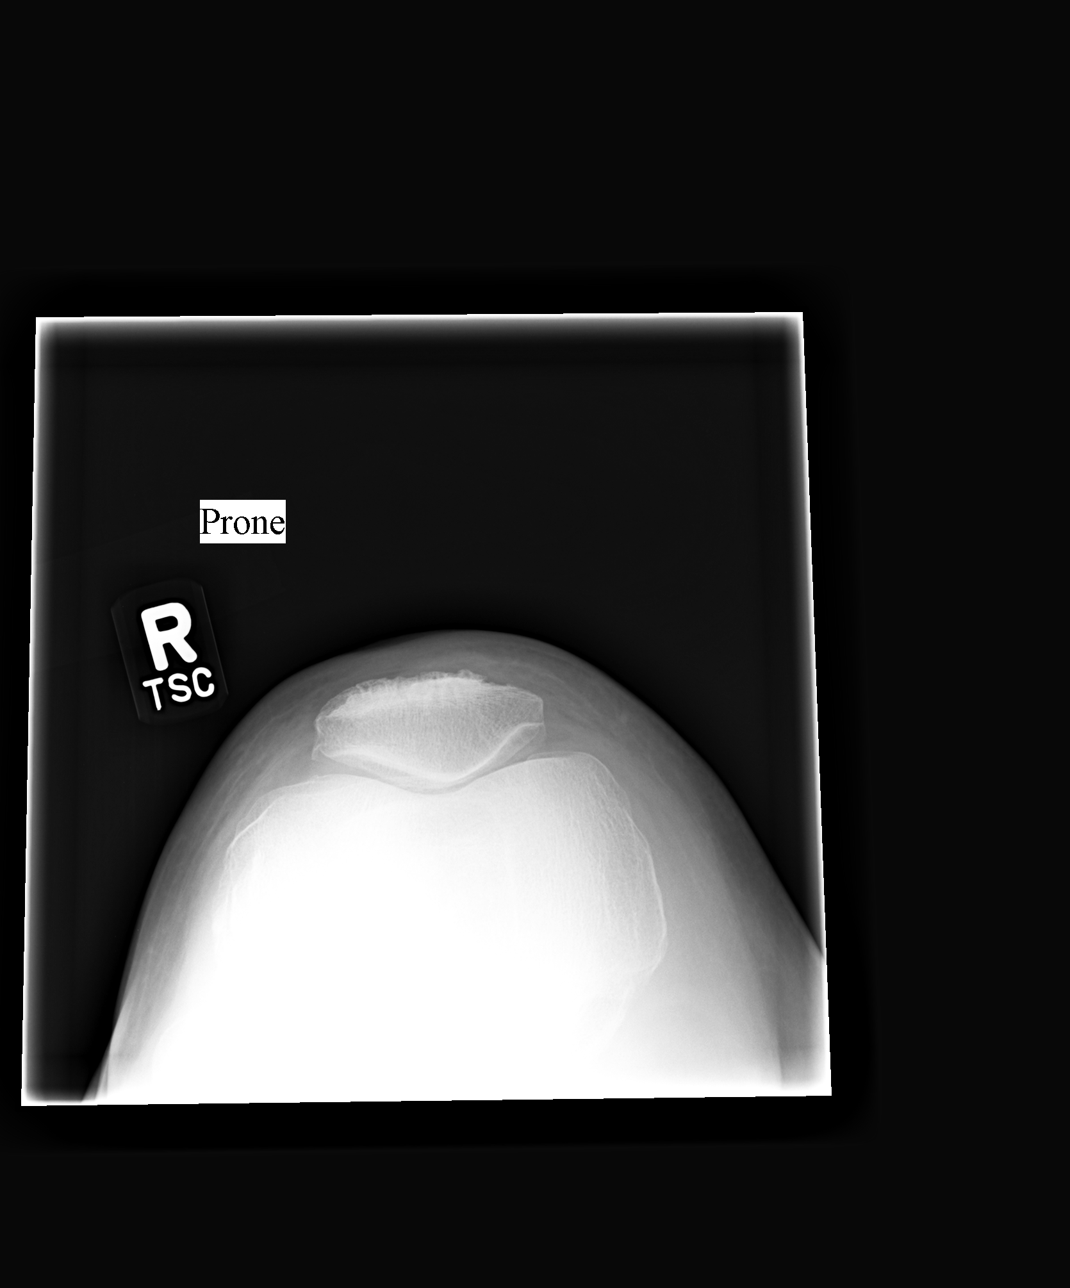

[view not recorded (4 of 4)]
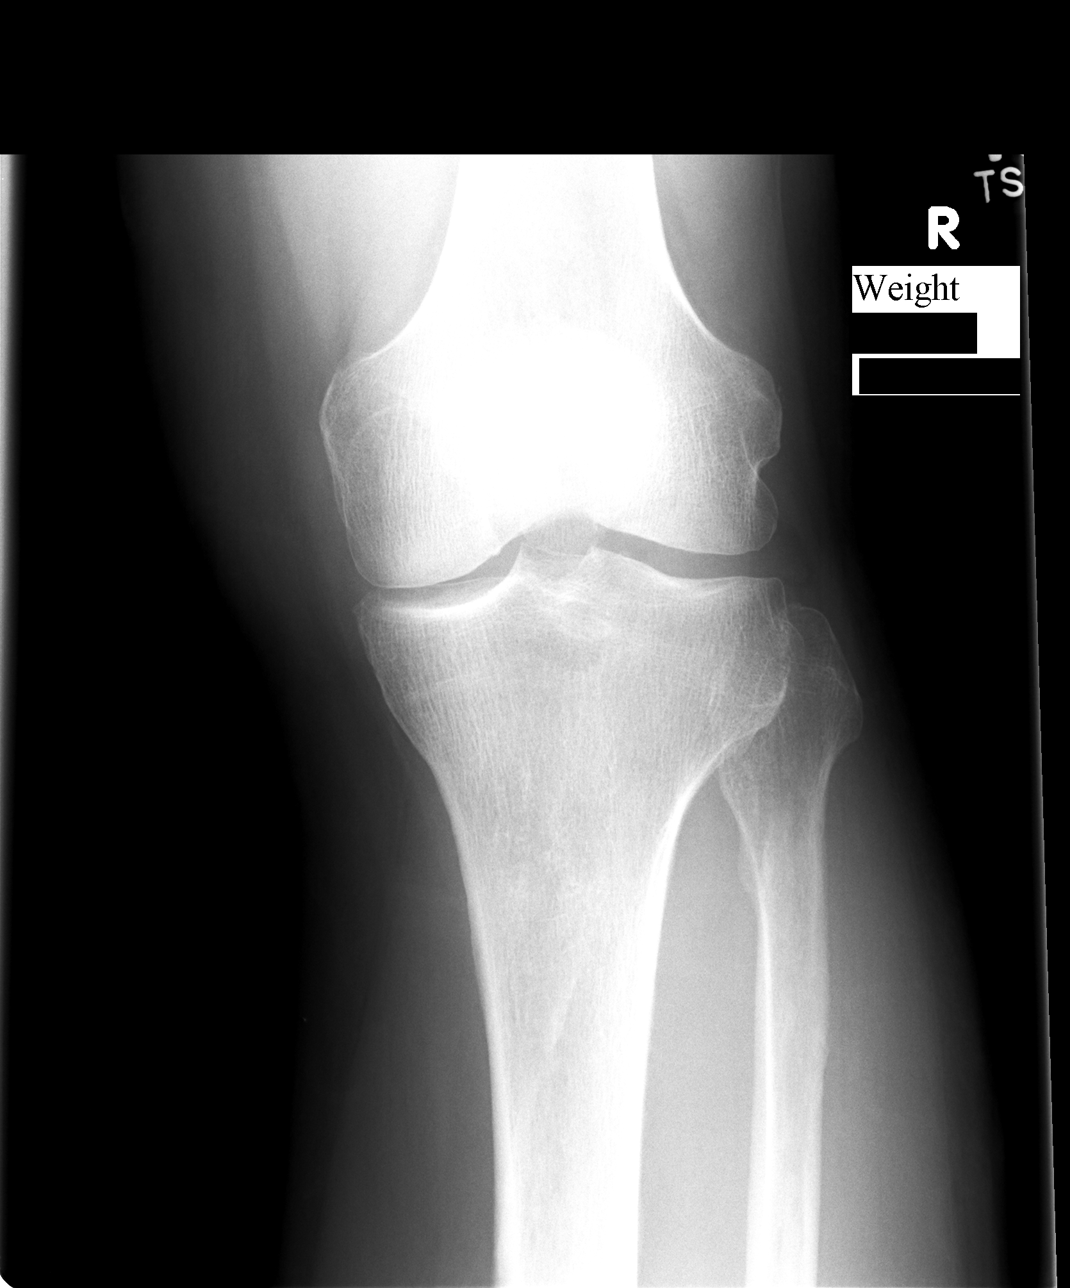

[4 of 4 positions shown; findings below may reference images not displayed]

FINDINGS: Mild patellofemoral degenerative change. Tiny loose body centrally
in the knee joint space cannot be excluded. No acute bony
abnormality. No evidence of prominent effusion .
IMPRESSION: Mild patellar degenerative change. Tiny loose body in the central
knee joint space cannot be excluded. No abnormality otherwise noted.

## 2015-11-14 MED ORDER — OMEPRAZOLE 20 MG PO CPDR: 20.0000 mg | DELAYED_RELEASE_CAPSULE | Freq: Every day | ORAL | Status: DC

## 2015-11-14 MED ORDER — MOMETASONE FUROATE 0.1 % EX CREA: TOPICAL_CREAM | CUTANEOUS | Status: DC

## 2015-11-14 MED ORDER — AMLODIPINE BESYLATE 10 MG PO TABS: 10.0000 mg | ORAL_TABLET | Freq: Every day | ORAL | Status: DC

## 2015-11-14 NOTE — Assessment & Plan Note (Signed)
Well-controlled on CPAP. Patient needs a letter to [place]. We will create this letter for him.

## 2015-11-14 NOTE — Progress Notes (Signed)
Pre visit review using our clinic review tool, if applicable. No additional management support is needed unless otherwise documented below in the visit note. 

## 2015-11-14 NOTE — Assessment & Plan Note (Signed)
Relatively well controlled with mometasone. We will refill this.

## 2015-11-14 NOTE — Assessment & Plan Note (Signed)
Stable. Patient's been on omeprazole for a long time. I did discuss it might be beneficial to have him come off of this given recent risks found out regarding this medication. He will attempt a trial off of this for 2-3 days. If the symptoms recur he will call and let us know. If they do recur may be beneficial to see GI for an EGD.

## 2015-11-14 NOTE — Assessment & Plan Note (Signed)
Unchanged from previously. Lab workup unremarkable. Exam today is unremarkable. We will obtain x-rays of his bilateral knees and his right hand. If these are unremarkable and may be worthwhile having him see rheumatology given his history of psoriasis.

## 2015-11-14 NOTE — Assessment & Plan Note (Signed)
At goal. Will continue amlodipine.

## 2015-11-14 NOTE — Patient Instructions (Signed)
Nice to see you. Please try coming off her omeprazole. Do this for 2-3 days. If her symptoms recur he can start this back, though he needs to let us know. We'll obtain x-rays of your knees and right hand. We will write a letter for you to pick up for CPAP materials.

## 2015-11-14 NOTE — Progress Notes (Signed)
Patient ID: Wayne Scott, male   DOB: 08-13-1951, 64 y.o.   MRN: [id number]  Tommi Rumps, MD Phone: [phone]  Wayne Scott is a 63 y.o. male who presents today for f/u.  HYPERTENSION Disease Monitoring Home BP Monitoring does not check at home, though notes on visits to dentist and another doctor's office is typically in the 120s/70s Chest pain- no    Dyspnea- no, notes sensation of having to breathe deeply is resolved since decreasing his food intake per meal Medications Compliance-  taking amlodipine 10 mg daily.  Edema- no  GERD: Patient notes is stable. He's been on omeprazole for 10-15 years. He has no sour taste or burning sensation. No abdominal pain. Has never had an EGD.  OSA: Patient notes he had a sleep study in 2011 that revealed obstructive sleep apnea. He has been wearing his CPAP machine nightly since then. He notes he wakes well rested. Has no morning headaches. Does not easily fall asleep. He notes a big difference if he does not wear his CPAP. He notes he needs a letter stating he needs a new CPAP machine.  Psoriasis: Patient notes psoriasis for many years. Typically on his extensor surfaces of his knees and elbows and then behind his ears. He has been using mometasone cream for this. This is beneficial.  Joint aches: Patient wonders if he may have arthritis related to his psoriasis. He notes a variety of joint aches. Things that mostly bother him are the joints in his right hand and his bilateral knees. They're achy in nature. It is intermittent. There is not typically swelling. There is no warmth. These been bothering him for a number of years. Lab workup after last visit was unremarkable.  PMH: nonsmoker.   ROS see history of present illness  Objective  Physical Exam Filed Vitals:   11/14/15 1020  BP: 118/68  Pulse: 80  Temp: 97.5 F (36.4 C)    Physical Exam  Constitutional: He is well-developed, well-nourished, and in no distress.  HENT:    Head: Normocephalic and atraumatic.  Right Ear: External ear normal.  Left Ear: External ear normal.  Mouth/Throat: Oropharynx is clear and moist. No oropharyngeal exudate.  Eyes: Conjunctivae are normal. Pupils are equal, round, and reactive to light.  Cardiovascular: Normal rate, regular rhythm and normal heart sounds.   Pulmonary/Chest: Effort normal and breath sounds normal.  Musculoskeletal:  Bilateral knees with no swelling, tenderness, erythema, warmth, or ligamentous laxity, negative McMurray's bilaterally Right hand with no joint swelling, joint tenderness, joint erythema, joint warmth, full range of motion of fingers, grip strength intact, sensation intact  Neurological: He is alert. Gait normal.  Skin: Skin is warm and dry. He is not diaphoretic.  Psoriatic plaques noted on bilateral knees, elbows, and behind each ear     Assessment/Plan: Please see individual problem list.  GERD (gastroesophageal reflux disease) Stable. Patient's been on omeprazole for a long time. I did discuss it might be beneficial to have him come off of this given recent risks found out regarding this medication. He will attempt a trial off of this for 2-3 days. If the symptoms recur he will call and let us know. If they do recur may be beneficial to see GI for an EGD.  Joint ache Unchanged from previously. Lab workup unremarkable. Exam today is unremarkable. We will obtain x-rays of his bilateral knees and his right hand. If these are unremarkable and may be worthwhile having him see rheumatology given his history of  psoriasis.  Obstructive sleep apnea Well-controlled on CPAP. Patient needs a letter to [place]. We will create this letter for him.  Essential hypertension At goal. Will continue amlodipine.  Psoriasis Relatively well controlled with mometasone. We will refill this.    Orders Placed This Encounter  Procedures  . DG Knee AP/LAT W/Sunrise Right    Standing Status: Future      Number of Occurrences: 1     Standing Expiration Date: 01/14/2017    Order Specific Question:  Reason for Exam (SYMPTOM  OR DIAGNOSIS REQUIRED)    Answer:  right hand pain, history of psoriasis    Order Specific Question:  Preferred imaging location?    Answer:  [place] Knee AP/LAT W/Sunrise Left    Standing Status: Future     Number of Occurrences: 1     Standing Expiration Date: 01/14/2017    Order Specific Question:  Reason for Exam (SYMPTOM  OR DIAGNOSIS REQUIRED)    Answer:  right hand pain, history of psoriasis    Order Specific Question:  Preferred imaging location?    Answer:  [place] Complete Right    Standing Status: Future     Number of Occurrences: 1     Standing Expiration Date: 01/14/2017    Order Specific Question:  Reason for Exam (SYMPTOM  OR DIAGNOSIS REQUIRED)    Answer:  right hand pain, history of psoriasis    Order Specific Question:  Preferred imaging location?    Answer:  [place] ordered this encounter  Medications  . mometasone (ELOCON) 0.1 % cream    Sig: APPLY TO AFFECTED AREA EVERY DAY    Dispense:  45 g    Refill:  6  . omeprazole (PRILOSEC) 20 MG capsule    Sig: Take 1 capsule (20 mg total) by mouth daily.    Dispense:  90 capsule    Refill:  3  . amLODipine (NORVASC) 10 MG tablet    Sig: Take 1 tablet (10 mg total) by mouth daily.    Dispense:  90 tablet    Refill:  3    Dragon voice recognition software was used during the dictation process of this note. If any phrases or words seem inappropriate it is likely secondary to the translation process being inefficient.  Tommi Rumps

## 2015-11-15 ENCOUNTER — Ambulatory Visit: Admission: RE | Admit: 2015-11-15 | Payer: Medicare Other | Source: Ambulatory Visit | Admitting: Urology

## 2015-11-15 ENCOUNTER — Encounter: Admission: RE | Payer: Self-pay | Source: Ambulatory Visit

## 2015-11-15 SURGERY — INSERTION, PENILE PROSTHESIS, INFLATABLE
Anesthesia: Choice

## 2015-11-16 ENCOUNTER — Telehealth: Payer: Self-pay | Admitting: Family Medicine

## 2015-11-16 ENCOUNTER — Encounter: Payer: Self-pay | Admitting: Urology

## 2015-11-16 ENCOUNTER — Encounter: Payer: Self-pay | Admitting: Family Medicine

## 2015-11-16 NOTE — Telephone Encounter (Signed)
Patient left voice mail on triage line. Patient stating returning Jamie's call and to please call him on his home phone # he can leave any information with his wife.

## 2015-11-17 NOTE — Telephone Encounter (Signed)
Called and gave patient X ray results and he is going to come by Tuesday and pick up letter.

## 2015-11-23 DIAGNOSIS — H353222 Exudative age-related macular degeneration, left eye, with inactive choroidal neovascularization: Secondary | ICD-10-CM | POA: Diagnosis not present

## 2015-11-24 ENCOUNTER — Telehealth: Payer: Self-pay | Admitting: *Deleted

## 2015-11-24 NOTE — Telephone Encounter (Signed)
Patient has requested a follow up on his form. Please call pt. when form is ready

## 2015-11-24 NOTE — Telephone Encounter (Signed)
Please advise, did he pick up the form?

## 2015-11-24 NOTE — Telephone Encounter (Signed)
Form up front and patient coming to pick up

## 2015-11-29 ENCOUNTER — Other Ambulatory Visit: Payer: Self-pay | Admitting: Family Medicine

## 2015-11-29 DIAGNOSIS — G4733 Obstructive sleep apnea (adult) (pediatric): Secondary | ICD-10-CM

## 2015-11-30 ENCOUNTER — Telehealth: Payer: Self-pay | Admitting: Family Medicine

## 2015-11-30 DIAGNOSIS — G4733 Obstructive sleep apnea (adult) (pediatric): Secondary | ICD-10-CM

## 2015-11-30 NOTE — Telephone Encounter (Signed)
Pt called about needing a New cpap machine. Dr Caryl Bis wrote a letter for him to be able to get the cpap machine and that was good. Dr Caryl Bis will be contacted to write a prescription for the cpap machine. The important thing to put on the prescription would be the pressure that would be the key thing. Any questions to call [phone]. Thank You!

## 2015-11-30 NOTE — Telephone Encounter (Signed)
Left message for patient to return call. Per Dr.Sonnenberg patient will need to be scheduled for a Sleep Study due to the one we have on file is from 2010 and he will need titrations.

## 2015-11-30 NOTE — Telephone Encounter (Signed)
Left msg for pt to call office to setup mychart/msn

## 2015-11-30 NOTE — Telephone Encounter (Signed)
Jamie,   Please advise

## 2015-12-01 ENCOUNTER — Telehealth: Payer: Self-pay | Admitting: *Deleted

## 2015-12-01 NOTE — Telephone Encounter (Signed)
Patient has returned a call in reference

## 2015-12-01 NOTE — Telephone Encounter (Signed)
Spoke with patient and he really does not want to go for another sleep study at this time. He stated that [place] said that the old sleep study would be sufficient for the order of new CPAP machine. Can you please place order for this with pressure settings.

## 2015-12-01 NOTE — Telephone Encounter (Signed)
Patient requested a call back, this morning to discuss the matter of the Cpap. He will be at his home number until 11am.

## 2015-12-02 NOTE — Telephone Encounter (Signed)
Orders faxed

## 2015-12-02 NOTE — Telephone Encounter (Signed)
Order placed

## 2015-12-14 ENCOUNTER — Ambulatory Visit (INDEPENDENT_AMBULATORY_CARE_PROVIDER_SITE_OTHER): Payer: Medicare Other | Admitting: Urology

## 2015-12-14 ENCOUNTER — Encounter: Payer: Self-pay | Admitting: Urology

## 2015-12-14 ENCOUNTER — Telehealth: Payer: Self-pay

## 2015-12-14 ENCOUNTER — Telehealth: Payer: Self-pay | Admitting: Urology

## 2015-12-14 VITALS — BP 147/87 | HR 87 | Ht 72.0 in | Wt 255.8 lb

## 2015-12-14 DIAGNOSIS — N529 Male erectile dysfunction, unspecified: Secondary | ICD-10-CM | POA: Diagnosis not present

## 2015-12-14 DIAGNOSIS — H353212 Exudative age-related macular degeneration, right eye, with inactive choroidal neovascularization: Secondary | ICD-10-CM | POA: Diagnosis not present

## 2015-12-14 NOTE — Progress Notes (Signed)
12/14/2015 12:59 PM   Sandy Salaam Liberman 10-02-1951 [id number]  Referring provider: Leone Haven, MD [address] [place], [place] [address]  Chief Complaint  Patient presents with  . Erectile Dysfunction    New Patient    HPI:  65 year old male with erectile dysfunction who presents today to establish care with [facility]. He's formally a patient of Dr. Yves Dill.   He has long-standing refractory erectile dysfunction. He has tried P5 inhibitors including Cialis, Levitra, and Viagra which are minimally effective and also cause side effects which are unfavorable.   Over the past 4 years, he's been managed with intracavernosal injections. He is also somewhat interested in discussing inflatable penile prosthesis today.   He denies any history of penile trauma or curvature.    Mr. Scelsi currently using trimix for about 4 years.  He is now using 50 units and getting a response about 50% of the time;   Trimix #5 (papaverine 30 mg, phen 1mg , prost 10 mcg.  Using 50 units.  5 mL, getting about 10 doses which lasted him ~1.5 months.     He denies any baseline urinary symptoms. No dysuria, gross hematuria, or urinary tract infections.   His most recent PSA was 0 point 2 ng/dL on 08/2015. He also had a rectal exam which was presumably normal by Dr. Yves Dill in November 2016 documented as normal.  IPSS Questionnaire (AUA-7): Over the past month.   1)  How often have you had a sensation of not emptying your bladder completely after you finish urinating?  0 - Not at all  2)  How often have you had to urinate again less than two hours after you finished urinating? 0 - Not at all  3)  How often have you found you stopped and started again several times when you urinated?  0 - Not at all  4) How difficult have you found it to postpone urination?  0 - Not at all  5) How often have you had a weak urinary stream?  0 - Not at all  6) How often have you had to push or  strain to begin urination?  0 - Not at all  7) How many times did you most typically get up to urinate from the time you went to bed until the time you got up in the morning?  1 - 1 time  Total score:  0-7 mildly symptomatic   8-19 moderately symptomatic   20-35 severely symptomatic   QOL 1, pleased      SHIM      12/18/15 1259       SHIM: Over the last 6 months:   How do you rate your confidence that you could get and keep an erection? Low     When you had erections with sexual stimulation, how often were your erections hard enough for penetration (entering your partner)? A Few Times (much less than half the time)     During sexual intercourse, how often were you able to maintain your erection after you had penetrated (entered) your partner? Difficult     During sexual intercourse, how difficult was it to maintain your erection to completion of intercourse? Difficult     When you attempted sexual intercourse, how often was it satisfactory for you? Very Difficult     SHIM Total Score   SHIM 12          PMH: Past Medical History  Diagnosis Date  . Arthritis   .  Headache   . GERD (gastroesophageal reflux disease)   . Allergic rhinitis   . Hypertension   . Psoriasis   . OSA (obstructive sleep apnea)   . Macular degeneration   . Heart murmur     Surgical History: Past Surgical History  Procedure Laterality Date  . Tonsillectomy    . Cataract extraction    . Nasal sinus surgery    . Vasectomy      Home Medications:    Medication List       This list is accurate as of: 12/14/15 11:59 PM.  Always use your most recent med list.               amLODipine 10 MG tablet  Commonly known as:  NORVASC  Take 1 tablet (10 mg total) by mouth daily.     cholecalciferol 1000 units tablet  Commonly known as:  VITAMIN D  Take 1,000 Units by mouth daily.     fluticasone 50 MCG/ACT nasal spray  Commonly known as:  FLONASE  Place 2 sprays into both nostrils daily.      mometasone 0.1 % cream  Commonly known as:  ELOCON  APPLY TO AFFECTED AREA EVERY DAY     multivitamin capsule  Take 1 capsule by mouth daily.     naproxen sodium 220 MG tablet  Commonly known as:  ANAPROX  Take 220 mg by mouth as needed.     omeprazole 20 MG capsule  Commonly known as:  PRILOSEC  Take 1 capsule (20 mg total) by mouth daily.     PROBIOTIC ADVANCED Caps  Take by mouth.     SUPER B COMPLEX PO  Take by mouth.     zolpidem 10 MG tablet  Commonly known as:  AMBIEN  Take 10 mg by mouth at bedtime as needed for sleep.        Allergies: No Known Allergies  Family History: Family History  Problem Relation Age of Onset  . Arthritis      Parent  . Hypertension      Parent  . Diabetes      Parent  . Bladder Cancer Neg Hx   . Prostate cancer Neg Hx   . Kidney cancer Neg Hx     Social History:  reports that he has never smoked. He does not have any smokeless tobacco history on file. He reports that he drinks alcohol. He reports that he does not use illicit drugs.  ROS: UROLOGY Frequent Urination?: No Hard to postpone urination?: No Burning/pain with urination?: No Get up at night to urinate?: Yes Leakage of urine?: No Urine stream starts and stops?: No Trouble starting stream?: No Do you have to strain to urinate?: No Blood in urine?: No Urinary tract infection?: No Sexually transmitted disease?: No Injury to kidneys or bladder?: No Painful intercourse?: No Weak stream?: No Erection problems?: Yes Penile pain?: No  Gastrointestinal Nausea?: No Vomiting?: No Indigestion/heartburn?: No Diarrhea?: No Constipation?: Yes  Constitutional Fever: No Night sweats?: No Weight loss?: No Fatigue?: No  Skin Skin rash/lesions?: No Itching?: No  Eyes Blurred vision?: Yes Double vision?: No  Ears/Nose/Throat Sore throat?: No Sinus problems?: Yes  Hematologic/Lymphatic Swollen glands?: No Easy bruising?: No  Cardiovascular Leg swelling?:  No Chest pain?: No  Respiratory Cough?: No Shortness of breath?: No  Endocrine Excessive thirst?: No  Musculoskeletal Back pain?: Yes Joint pain?: Yes  Neurological Headaches?: No Dizziness?: No  Psychologic Depression?: No Anxiety?: No  Physical Exam: BP 147/87  mmHg  Pulse 87  Ht 6' (1.829 m)  Wt 255 lb 12.8 oz (116.03 kg)  BMI 34.69 kg/m2  Constitutional:  Alert and oriented, No acute distress. HEENT: [place] AT, moist mucus membranes.  Trachea midline, no masses. Cardiovascular: No clubbing, cyanosis, or edema. Respiratory: Normal respiratory effort, no increased work of breathing. GI: Abdomen is soft, nontender, nondistended, no abdominal masses GU: No CVA tenderness.   Testicles descended bilaterally, no masses, nontender. Normal circumcised phallus with orthotopic patent urethral meatus. No palpable penile plaques. Rectal exam deferred today as recently performed by former urologist Skin: No rashes, bruises or suspicious lesions. Lymph: No cervical or inguinal adenopathy. Neurologic: Grossly intact, no focal deficits, moving all 4 extremities. Psychiatric: Normal mood and affect.  Laboratory Data: Lab Results  Component Value Date   WBC 7.8 09/21/2015   HCT 47.7 09/21/2015   MCV 92 09/21/2015   PLT 207 09/21/2015    Lab Results  Component Value Date   CREATININE 0.85 09/21/2015     Lab Results  Component Value Date   HGBA1C 5.8* 09/21/2015    10/16 PSA 0.2 ng/ DL   Pertinent Imaging: n/a  Assessment & Plan:    1. Erectile dysfunction unresponsive to phosphodiesterase type 5 inhibitor  we had a lengthy discussion today about his various treatment options for refractory erectile dysfunction. We discussed the pathophysiology of erectile dysfunction today along with possible congestive any factors. Discussed possible treatment options including PDE 5 inhibitors, vacuum erectile device, intracavernosal injection, MUSE, and placement of the inflatable  or malleable penal prosthesis for refractory cases.     He is having Trimix 50% of time with his current dose of tri-mix. We discussed slowly titrating the dose up to 1 cc to assess its efficacy at a higher dose.   He has no history of prolonged erection or priapism. We discussed warning signs and reasons to seek urgent medical attention.   Year supply of Trimix called into compounding pharmacy today.   additionally, we discussed implantation of penile prosthesis. We discussed all of the risks and benefits of surgery including risks of bleeding, infection, mechanical failure of the device, need for  Explantation or revision,  Erosion or malposition of the device,  Loss in penile length , and other implications of pursuing penile implant. I discussed my experience with IPP placement an of also offered to refer him to a high implant Center. After lengthy discussion review of materials, he would like  Continue with intracavernosal injections but may consider penile prosthesis in the future. He will call and let us know if he elected to follow up sooner. He also has some questions today about his financial responsibility related to the surgical procedure.   Return in about 1 year (around 12/13/2016) for recheck ED.  Hollice Espy, MD  [facility] [address] [place], [place] [address] [phone]

## 2015-12-14 NOTE — Telephone Encounter (Signed)
PATIENT CALLED AND STATED THAT WE CALLED IN THE WRONG DOSE FOR HIS TRIMEX AND WANTS [place] TO CORRECT IT. HER SAID WE CALLED IN THE WEAKER DOSE AND HE WANTED A STRONGER DOSE. HE WOULD LIKE THIS CORRECTED AND A PHONE CALL BACK. I TOLD HIM WE WERE IN CLINIC AND IT WOULD PROBABLY BE SOMETIME TOMORROW BEFORE SOMEONE COULD GET BACK WITH HIM.   THANKS, MICHELLE

## 2015-12-14 NOTE — Telephone Encounter (Signed)
I called and spoke w/ the pt and he informed me that the pharmacy gave him the wrong

## 2015-12-15 NOTE — Telephone Encounter (Signed)
Wayne Scott was able to handle this

## 2015-12-16 ENCOUNTER — Encounter: Payer: Self-pay | Admitting: Family Medicine

## 2015-12-27 NOTE — Telephone Encounter (Signed)
I spoke w/ the patient and he informed me that the pharmacy gave him the wrong information, but he got it worked out.

## 2016-01-02 DIAGNOSIS — H353222 Exudative age-related macular degeneration, left eye, with inactive choroidal neovascularization: Secondary | ICD-10-CM | POA: Diagnosis not present

## 2016-01-11 ENCOUNTER — Ambulatory Visit (INDEPENDENT_AMBULATORY_CARE_PROVIDER_SITE_OTHER): Payer: Medicare Other | Admitting: Family Medicine

## 2016-01-11 ENCOUNTER — Encounter: Payer: Self-pay | Admitting: Family Medicine

## 2016-01-11 VITALS — BP 122/74 | HR 83 | Temp 99.5°F | Ht 72.0 in | Wt 250.4 lb

## 2016-01-11 DIAGNOSIS — R52 Pain, unspecified: Secondary | ICD-10-CM

## 2016-01-11 DIAGNOSIS — J111 Influenza due to unidentified influenza virus with other respiratory manifestations: Secondary | ICD-10-CM | POA: Diagnosis not present

## 2016-01-11 DIAGNOSIS — G4733 Obstructive sleep apnea (adult) (pediatric): Secondary | ICD-10-CM

## 2016-01-11 DIAGNOSIS — R062 Wheezing: Secondary | ICD-10-CM | POA: Diagnosis not present

## 2016-01-11 LAB — POCT INFLUENZA A/B
Influenza A, POC: POSITIVE — AB
Influenza B, POC: POSITIVE — AB

## 2016-01-11 MED ORDER — ALBUTEROL SULFATE (2.5 MG/3ML) 0.083% IN NEBU
2.5000 mg | INHALATION_SOLUTION | Freq: Once | RESPIRATORY_TRACT | Status: AC
  Administered 2016-01-11: 2.5 mg via RESPIRATORY_TRACT

## 2016-01-11 MED ORDER — ALBUTEROL SULFATE HFA 108 (90 BASE) MCG/ACT IN AERS: 2.0000 | INHALATION_SPRAY | Freq: Four times a day (QID) | RESPIRATORY_TRACT | Status: DC | PRN

## 2016-01-11 MED ORDER — OSELTAMIVIR PHOSPHATE 75 MG PO CAPS: 75.0000 mg | ORAL_CAPSULE | Freq: Two times a day (BID) | ORAL | Status: DC

## 2016-01-11 MED ORDER — HYDROCODONE-HOMATROPINE 5-1.5 MG/5ML PO SYRP: 5.0000 mL | ORAL_SOLUTION | Freq: Four times a day (QID) | ORAL | Status: DC | PRN

## 2016-01-11 NOTE — Assessment & Plan Note (Signed)
Patient's symptoms consistent with influenza. Had positive rapid flu test in the office. Given his age and history of sleep apnea we will treat with Tamiflu 75 mg twice daily for 5 days. Tylenol or ibuprofen for discomfort. Albuterol inhaler every 6 hours for the next 2 days as well. Hycodan for cough. Given return precautions.

## 2016-01-11 NOTE — Progress Notes (Signed)
Patient ID: JAKSEN AUDE, male   DOB: 09-May-1951, 65 y.o.   MRN: [id number]  Wayne Rumps, MD Phone: [phone]  Wayne Scott is a 65 y.o. male who presents today for same day visit.  Patient notes onset of tickle in throat and chest with congestion on Sunday. Subsequently developed cough with chest congestion and mild tightness in chest with cough. Notes deep breaths cause him to cough. Unsure if he is wheezing. Notes he will cough if he takes deep breath. Notes body aches as well. Notes his head is congested. No shortness of breath or chest pain. Unsure regarding sick contacts. Had temperature of 101F last night.  PMH: nonsmoker   ROS see history of present illness  Objective  Physical Exam Filed Vitals:   01/11/16 0946  BP: 122/74  Pulse: 83  Temp: 99.5 F (37.5 C)    Physical Exam  Constitutional: No distress.  Tired-appearing, nontoxic  HENT:  Head: Normocephalic and atraumatic.  Right Ear: External ear normal.  Left Ear: External ear normal.  Mouth/Throat: Oropharynx is clear and moist. No oropharyngeal exudate.  Eyes: Conjunctivae are normal. Pupils are equal, round, and reactive to light.  Neck: Neck supple.  Cardiovascular: Normal rate, regular rhythm and normal heart sounds.   Pulmonary/Chest: Effort normal. No respiratory distress. He has wheezes (scattered expiratory). He has no rales.  Lymphadenopathy:    He has no cervical adenopathy.  Neurological: He is alert. Gait normal.  Skin: Skin is warm. He is not diaphoretic.   Patient was given albuterol nebulizer treatment in the office and had improvement in the tightness in his chest and wheezing. Normal lung sounds following nebulizer treatment.  Assessment/Plan: Please see individual problem list.  Influenza Patient's symptoms consistent with influenza. Had positive rapid flu test in the office. Given his age and history of sleep apnea we will treat with Tamiflu 75 mg twice daily for 5 days.  Tylenol or ibuprofen for discomfort. Albuterol inhaler every 6 hours for the next 2 days as well. Hycodan for cough. Given return precautions.    Orders Placed This Encounter  Procedures  . POCT Influenza A/B    Meds ordered this encounter  Medications  . oseltamivir (TAMIFLU) 75 MG capsule    Sig: Take 1 capsule (75 mg total) by mouth 2 (two) times daily.    Dispense:  10 capsule    Refill:  0  . HYDROcodone-homatropine (HYCODAN) 5-1.5 MG/5ML syrup    Sig: Take 5 mLs by mouth every 6 (six) hours as needed for cough.    Dispense:  120 mL    Refill:  0  . albuterol (PROVENTIL HFA;VENTOLIN HFA) 108 (90 Base) MCG/ACT inhaler    Sig: Inhale 2 puffs into the lungs every 6 (six) hours as needed for wheezing or shortness of breath.    Dispense:  1 Inhaler    Refill:  0  . albuterol (PROVENTIL) (2.5 MG/3ML) 0.083% nebulizer solution 2.5 mg    Sig:      Wayne Scott

## 2016-01-11 NOTE — Patient Instructions (Signed)
Nice to see you. You have the flu. You need to stay well hydrated. We will treat you with Tamiflu and Hycodan. You can also take Tylenol or ibuprofen to help with discomfort. Please use the albuterol inhaler every 6 hours for the next 2 days. If you develop chest pain, shortness of breath, cough productive of blood, persistent fevers, or do not improve please seek medical attention.

## 2016-01-11 NOTE — Progress Notes (Signed)
Pre visit review using our clinic review tool, if applicable. No additional management support is needed unless otherwise documented below in the visit note. 

## 2016-01-18 ENCOUNTER — Ambulatory Visit (INDEPENDENT_AMBULATORY_CARE_PROVIDER_SITE_OTHER): Payer: Medicare Other | Admitting: Family Medicine

## 2016-01-18 ENCOUNTER — Encounter: Payer: Self-pay | Admitting: Family Medicine

## 2016-01-18 VITALS — BP 114/76 | HR 75 | Temp 98.0°F | Ht 72.0 in | Wt 251.0 lb

## 2016-01-18 DIAGNOSIS — J111 Influenza due to unidentified influenza virus with other respiratory manifestations: Secondary | ICD-10-CM | POA: Diagnosis not present

## 2016-01-18 DIAGNOSIS — G4733 Obstructive sleep apnea (adult) (pediatric): Secondary | ICD-10-CM | POA: Diagnosis not present

## 2016-01-18 DIAGNOSIS — Z1211 Encounter for screening for malignant neoplasm of colon: Secondary | ICD-10-CM | POA: Diagnosis not present

## 2016-01-18 NOTE — Progress Notes (Signed)
Patient ID: Wayne Scott, male   DOB: March 05, 1951, 66 y.o.   MRN: [id number]  Tommi Rumps, MD Phone: [phone]  Wayne Scott is a 65 y.o. male who presents today for follow-up.  Obstructive sleep apnea: Patient notes he has been using his new CPAP machine for the last 33 days. Notes he is using it every day. He is using it for at least 4 hours a night. Notes he wears it the entire time he is asleep. He notes he is sleeping significantly better and for longer periods of time at night. Sleeps 6-9 hours now. He wakes up well rested. He notes the new machine is much easier for him to breathe with. Does not fall asleep in front of the TV or at stop lights. He feels significantly better with the new machine. Denies trouble breathing.  Patient notes he is significantly improved from the influenza. I saw him a week ago. He notes he has a mild scratchy throat though no other symptoms at this time. He feels back to baseline.  PMH: nonsmoker.   ROS see history of present illness  Objective  Physical Exam Filed Vitals:   01/18/16 0958  BP: 114/76  Pulse: 75  Temp: 98 F (36.7 C)    BP Readings from Last 3 Encounters:  01/18/16 114/76  01/11/16 122/74  12/14/15 147/87   Wt Readings from Last 3 Encounters:  01/18/16 251 lb (113.853 kg)  01/11/16 250 lb 6.4 oz (113.581 kg)  12/14/15 255 lb 12.8 oz (116.03 kg)    Physical Exam  Constitutional: He is well-developed, well-nourished, and in no distress.  HENT:  Head: Normocephalic and atraumatic.  Right Ear: External ear normal.  Left Ear: External ear normal.  Mouth/Throat: No oropharyngeal exudate.  Minimal posterior pharyngeal erythema  Eyes: Conjunctivae are normal. Pupils are equal, round, and reactive to light.  Neck: Neck supple.  Cardiovascular: Normal rate, regular rhythm and normal heart sounds.  Exam reveals no gallop and no friction rub.   No murmur heard. Pulmonary/Chest: Effort normal and breath sounds  normal. No respiratory distress. He has no wheezes. He has no rales.  Lymphadenopathy:    He has no cervical adenopathy.  Neurological: He is alert. Gait normal.  Skin: Skin is warm and dry. He is not diaphoretic.     Assessment/Plan: Please see individual problem list.  Obstructive sleep apnea Significantly improved with new CPAP machine. He is using it daily and greater than 4 hours per night. Patient will continue to use current machine. He'll monitor for new symptoms. Given return precautions.  Influenza Much improved. Will continue to monitor for recurrence.    Orders Placed This Encounter  Procedures  . Ambulatory referral to Gastroenterology    Referral Priority:  Routine    Referral Type:  Consultation    Referral Reason:  Specialty Services Required    Number of Visits Requested:  1    No orders of the defined types were placed in this encounter.    # Healthcare maintenance: Order placed for GI referral for colonoscopy. Patient will return for Zostavax, Prevnar, and Tdap given that he is just one week out from the flu.   Tommi Rumps

## 2016-01-18 NOTE — Assessment & Plan Note (Signed)
Significantly improved with new CPAP machine. He is using it daily and greater than 4 hours per night. Patient will continue to use current machine. He'll monitor for new symptoms. Given return precautions.

## 2016-01-18 NOTE — Patient Instructions (Addendum)
Nice to you. Please schedule an appointment in about 2 weeks to get a Zostavax, Prevnar, and Tdap with nursing. Please continue to use your CPAP. If you develop trouble breathing, increased drowsiness, or any new or change in symptoms please seek medical attention.

## 2016-01-18 NOTE — Assessment & Plan Note (Signed)
Much improved. Will continue to monitor for recurrence.

## 2016-01-18 NOTE — Progress Notes (Signed)
Pre visit review using our clinic review tool, if applicable. No additional management support is needed unless otherwise documented below in the visit note. 

## 2016-01-27 DIAGNOSIS — H353211 Exudative age-related macular degeneration, right eye, with active choroidal neovascularization: Secondary | ICD-10-CM | POA: Diagnosis not present

## 2016-02-17 ENCOUNTER — Telehealth: Payer: Self-pay

## 2016-02-17 NOTE — Telephone Encounter (Signed)
I spoke with the pt and he informed me that when he called to get his refill on his Trimix at [place] they notified him he didn't have any refills. I called to the pharmacy once again and spoke with [facility] and notified them that the pt should have 1 years worth of refills. Misty stated she will contact the pt to notify him.

## 2016-02-20 DIAGNOSIS — H353222 Exudative age-related macular degeneration, left eye, with inactive choroidal neovascularization: Secondary | ICD-10-CM | POA: Diagnosis not present

## 2016-03-12 DIAGNOSIS — H353211 Exudative age-related macular degeneration, right eye, with active choroidal neovascularization: Secondary | ICD-10-CM | POA: Diagnosis not present

## 2016-04-16 DIAGNOSIS — H353222 Exudative age-related macular degeneration, left eye, with inactive choroidal neovascularization: Secondary | ICD-10-CM | POA: Diagnosis not present

## 2016-04-27 DIAGNOSIS — D3131 Benign neoplasm of right choroid: Secondary | ICD-10-CM | POA: Diagnosis not present

## 2016-04-27 DIAGNOSIS — H353211 Exudative age-related macular degeneration, right eye, with active choroidal neovascularization: Secondary | ICD-10-CM | POA: Diagnosis not present

## 2016-06-07 DIAGNOSIS — H353211 Exudative age-related macular degeneration, right eye, with active choroidal neovascularization: Secondary | ICD-10-CM | POA: Diagnosis not present

## 2016-06-11 DIAGNOSIS — H353211 Exudative age-related macular degeneration, right eye, with active choroidal neovascularization: Secondary | ICD-10-CM | POA: Diagnosis not present

## 2016-07-13 DIAGNOSIS — H26492 Other secondary cataract, left eye: Secondary | ICD-10-CM | POA: Diagnosis not present

## 2016-07-13 DIAGNOSIS — H353211 Exudative age-related macular degeneration, right eye, with active choroidal neovascularization: Secondary | ICD-10-CM | POA: Diagnosis not present

## 2016-07-20 DIAGNOSIS — H353211 Exudative age-related macular degeneration, right eye, with active choroidal neovascularization: Secondary | ICD-10-CM | POA: Diagnosis not present

## 2016-07-20 DIAGNOSIS — H353222 Exudative age-related macular degeneration, left eye, with inactive choroidal neovascularization: Secondary | ICD-10-CM | POA: Diagnosis not present

## 2016-08-17 DIAGNOSIS — H353222 Exudative age-related macular degeneration, left eye, with inactive choroidal neovascularization: Secondary | ICD-10-CM | POA: Diagnosis not present

## 2016-08-24 DIAGNOSIS — H353211 Exudative age-related macular degeneration, right eye, with active choroidal neovascularization: Secondary | ICD-10-CM | POA: Diagnosis not present

## 2016-08-24 DIAGNOSIS — H353222 Exudative age-related macular degeneration, left eye, with inactive choroidal neovascularization: Secondary | ICD-10-CM | POA: Diagnosis not present

## 2016-09-28 DIAGNOSIS — H353211 Exudative age-related macular degeneration, right eye, with active choroidal neovascularization: Secondary | ICD-10-CM | POA: Diagnosis not present

## 2016-11-02 DIAGNOSIS — H353222 Exudative age-related macular degeneration, left eye, with inactive choroidal neovascularization: Secondary | ICD-10-CM | POA: Diagnosis not present

## 2016-11-06 ENCOUNTER — Telehealth: Payer: Self-pay

## 2016-11-06 NOTE — Telephone Encounter (Signed)
Pt called requesting refills of trimix. Pt will f/u in jan for 48yr f/u.

## 2016-11-09 DIAGNOSIS — H353211 Exudative age-related macular degeneration, right eye, with active choroidal neovascularization: Secondary | ICD-10-CM | POA: Diagnosis not present

## 2016-11-10 ENCOUNTER — Other Ambulatory Visit: Payer: Self-pay | Admitting: Family Medicine

## 2016-11-12 NOTE — Telephone Encounter (Signed)
90 day supply sent in. Patient needs a follow-up appointment scheduled sometime in the next 1-2 months.

## 2016-11-12 NOTE — Telephone Encounter (Signed)
Last seen 01/18/16 last filled omeprazole 11/14/15 90 3rf amlodipine 11/14/15 90 3rf

## 2016-11-13 NOTE — Telephone Encounter (Signed)
Pt called back and stated that he needs to look at his schedule and will call back to set up an appt.

## 2016-11-13 NOTE — Telephone Encounter (Signed)
Left message to return call 

## 2016-12-04 DIAGNOSIS — Z23 Encounter for immunization: Secondary | ICD-10-CM | POA: Diagnosis not present

## 2016-12-14 ENCOUNTER — Ambulatory Visit: Payer: Medicare Other | Admitting: Urology

## 2016-12-14 DIAGNOSIS — H353211 Exudative age-related macular degeneration, right eye, with active choroidal neovascularization: Secondary | ICD-10-CM | POA: Diagnosis not present

## 2017-01-04 DIAGNOSIS — H353222 Exudative age-related macular degeneration, left eye, with inactive choroidal neovascularization: Secondary | ICD-10-CM | POA: Diagnosis not present

## 2017-01-18 ENCOUNTER — Encounter: Payer: Self-pay | Admitting: Family Medicine

## 2017-01-18 ENCOUNTER — Ambulatory Visit (INDEPENDENT_AMBULATORY_CARE_PROVIDER_SITE_OTHER): Payer: Medicare Other | Admitting: Family Medicine

## 2017-01-18 VITALS — BP 122/80 | HR 86 | Temp 98.6°F | Wt 261.0 lb

## 2017-01-18 DIAGNOSIS — M25562 Pain in left knee: Secondary | ICD-10-CM | POA: Diagnosis not present

## 2017-01-18 DIAGNOSIS — M1990 Unspecified osteoarthritis, unspecified site: Secondary | ICD-10-CM | POA: Diagnosis not present

## 2017-01-18 DIAGNOSIS — G4733 Obstructive sleep apnea (adult) (pediatric): Secondary | ICD-10-CM

## 2017-01-18 DIAGNOSIS — Z1211 Encounter for screening for malignant neoplasm of colon: Secondary | ICD-10-CM | POA: Diagnosis not present

## 2017-01-18 DIAGNOSIS — I10 Essential (primary) hypertension: Secondary | ICD-10-CM | POA: Diagnosis not present

## 2017-01-18 DIAGNOSIS — R7303 Prediabetes: Secondary | ICD-10-CM | POA: Diagnosis not present

## 2017-01-18 DIAGNOSIS — L409 Psoriasis, unspecified: Secondary | ICD-10-CM

## 2017-01-18 DIAGNOSIS — M25561 Pain in right knee: Secondary | ICD-10-CM

## 2017-01-18 NOTE — Assessment & Plan Note (Signed)
Suspect related to osteoarthritis given prior x-ray imaging. We are going to refer to rheumatology given his history of psoriasis to ensure that there are no other contributing factors.

## 2017-01-18 NOTE — Assessment & Plan Note (Signed)
Seems to be having more issues with this recently. We will get him set up for CPAP titration.

## 2017-01-18 NOTE — Assessment & Plan Note (Signed)
At goal. Continue amlodipine. He'll return for lab work.

## 2017-01-18 NOTE — Progress Notes (Signed)
Tommi Rumps, MD Phone: [phone]  Wayne Scott is a 66 y.o. male who presents today for f/u.  HYPERTENSION  Disease Monitoring  Home BP Monitoring 120-130's/80's Chest pain- no    Dyspnea- no Medications  Compliance-  Taking amlodipine.  Edema- only if he stands for a long time. Goes down if he sits down. No orthopnea or PND.  Objective sleep apnea: He is using his CPAP nightly for at least 5-6 hours. Does not wake up very well rested. He falls asleep easily while watching TV. Has not had his CPAP reevaluated in some time.  Continues to have issues with his knees. Notes they're worse after his been sitting down for a while and then gets up. They get better with more movement. Prior x-rays revealed osteoarthritis. Also revealed osteoarthritis in his hand. He does have a history of psoriasis. He does have psoriatic plaques on his knees and elbows. He uses a topical steroid with some benefit. Has not seen dermatology in some time.   PMH: nonsmoker.   ROS see history of present illness  Objective  Physical Exam Vitals:   01/18/17 1351  BP: 122/80  Pulse: 86  Temp: 98.6 F (37 C)    BP Readings from Last 3 Encounters:  01/18/17 122/80  01/18/16 114/76  01/11/16 122/74   Wt Readings from Last 3 Encounters:  01/18/17 261 lb (118.4 kg)  01/18/16 251 lb (113.9 kg)  01/11/16 250 lb 6.4 oz (113.6 kg)    Physical Exam  Constitutional: No distress.  HENT:  Head: Normocephalic and atraumatic.  Cardiovascular: Normal rate, regular rhythm and normal heart sounds.   Pulmonary/Chest: Effort normal and breath sounds normal.  Musculoskeletal:  Bilateral knees with no joint line tenderness, soft tissue tenderness, bony tenderness, warmth, erythema, or ligamentous laxity, negative McMurray's bilaterally  Neurological: He is alert. Gait normal.  Skin: Skin is warm and dry. He is not diaphoretic.  Psoriatic plaques noted over bilateral knees and elbows      Assessment/Plan: Please see individual problem list.  Joint ache Suspect related to osteoarthritis given prior x-ray imaging. We are going to refer to rheumatology given his history of psoriasis to ensure that there are no other contributing factors.  Psoriasis Refer to dermatology.  Obstructive sleep apnea Seems to be having more issues with this recently. We will get him set up for CPAP titration.  Essential hypertension At goal. Continue amlodipine. He'll return for lab work.   Orders Placed This Encounter  Procedures  . Comp Met (CMET)    Standing Status:   Future    Standing Expiration Date:   01/18/2018  . Lipid Profile    Standing Status:   Future    Standing Expiration Date:   01/18/2018  . HgB A1c    Standing Status:   Future    Standing Expiration Date:   01/18/2018  . Ambulatory referral to Dermatology    Referral Priority:   Routine    Referral Type:   Consultation    Referral Reason:   Specialty Services Required    Requested Specialty:   Dermatology    Number of Visits Requested:   1  . Ambulatory referral to Rheumatology    Referral Priority:   Routine    Referral Type:   Consultation    Referral Reason:   Specialty Services Required    Requested Specialty:   Rheumatology    Number of Visits Requested:   1  . Ambulatory referral to Gastroenterology  Referral Priority:   Routine    Referral Type:   Consultation    Referral Reason:   Specialty Services Required    Number of Visits Requested:   1  . Cpap titration    Standing Status:   Future    Standing Expiration Date:   01/18/2018    Order Specific Question:   Where should this test be performed:    Answer:   [place]    Meds ordered this encounter  Medications  . Multiple Vitamins-Minerals (ICAPS AREDS 2 PO)    Sig: Take by mouth.    # Healthcare maintenance: Refer to GI for colonoscopy   Tommi Rumps, MD [place]

## 2017-01-18 NOTE — Patient Instructions (Signed)
Nice to see you. We will refer you to dermatology and rheumatology. We will get you set up for CPAP titration as well. Please return sometime in the next 1-2 weeks for fasting lab work as well.

## 2017-01-18 NOTE — Assessment & Plan Note (Signed)
Refer to dermatology 

## 2017-01-18 NOTE — Progress Notes (Signed)
Pre visit review using our clinic review tool, if applicable. No additional management support is needed unless otherwise documented below in the visit note. 

## 2017-01-24 ENCOUNTER — Other Ambulatory Visit (INDEPENDENT_AMBULATORY_CARE_PROVIDER_SITE_OTHER): Payer: Medicare Other

## 2017-01-24 ENCOUNTER — Other Ambulatory Visit: Payer: Self-pay | Admitting: Family Medicine

## 2017-01-24 ENCOUNTER — Telehealth: Payer: Self-pay

## 2017-01-24 DIAGNOSIS — R7303 Prediabetes: Secondary | ICD-10-CM | POA: Diagnosis not present

## 2017-01-24 DIAGNOSIS — I10 Essential (primary) hypertension: Secondary | ICD-10-CM | POA: Diagnosis not present

## 2017-01-24 LAB — COMPREHENSIVE METABOLIC PANEL
ALT: 43 U/L (ref 0–53)
AST: 31 U/L (ref 0–37)
Albumin: 4.4 g/dL (ref 3.5–5.2)
Alkaline Phosphatase: 80 U/L (ref 39–117)
BUN: 10 mg/dL (ref 6–23)
CO2: 26 mEq/L (ref 19–32)
Calcium: 9.3 mg/dL (ref 8.4–10.5)
Chloride: 103 mEq/L (ref 96–112)
Creatinine, Ser: 0.85 mg/dL (ref 0.40–1.50)
GFR: 95.78 mL/min (ref >60.00)
Glucose, Bld: 121 mg/dL — ABNORMAL HIGH (ref 70–99)
Potassium: 4 mEq/L (ref 3.5–5.1)
Sodium: 139 mEq/L (ref 135–145)
Total Bilirubin: 0.8 mg/dL (ref 0.2–1.2)
Total Protein: 7.4 g/dL (ref 6.0–8.3)

## 2017-01-24 LAB — LIPID PANEL
Cholesterol: 198 mg/dL (ref 0–200)
HDL: 71.4 mg/dL (ref >39.00)
LDL Cholesterol: 113 mg/dL — ABNORMAL HIGH (ref 0–99)
NonHDL: 126.59
Total CHOL/HDL Ratio: 3
Triglycerides: 66 mg/dL (ref 0.0–149.0)
VLDL: 13.2 mg/dL (ref 0.0–40.0)

## 2017-01-24 LAB — HEMOGLOBIN A1C: Hgb A1c MFr Bld: 6.2 % (ref 4.6–6.5)

## 2017-01-24 MED ORDER — TETANUS-DIPHTH-ACELL PERTUSSIS 5-2.5-18.5 LF-MCG/0.5 IM SUSP: 0.5000 mL | Freq: Once | INTRAMUSCULAR | 0 refills | Status: AC

## 2017-01-24 NOTE — Telephone Encounter (Signed)
Prescription written

## 2017-01-24 NOTE — Progress Notes (Signed)
01/25/2017 4:01 PM   Wayne Scott 04-17-1951 [id number]  Referring provider: Leone Haven, MD [address] [place], [place] [address]  Chief Complaint  Patient presents with  . Erectile Dysfunction    HPI:  66 year old male with erectile dysfunction managed on tri-mix who returns today for his annual follow-up.   He has long-standing refractory erectile dysfunction. He has tried P5 inhibitors including Cialis, Levitra, and Viagra which are minimally effective and also cause side effects which are unfavorable.   Over the past 5 years, he's been managed with intracavernosal injections. He is also somewhat interested in discussing inflatable penile prosthesis today.   He denies any history of penile trauma or curvature.    Wayne Scott currently using trimix for about 4 years.  He is now using 90 units Trimix #5 (papaverine 30 mg, phen 1mg , prost 10 mcg.  He has an excellent response and seems to be working well.     He denies any baseline urinary symptoms. No dysuria, gross hematuria, or urinary tract infections.  He's had a cardiac workup within the past year and told that he has no heart disease.   His most recent PSA was  0.2 ng/dL on 08/2015. He also had a rectal exam which was presumably normal by Dr. Yves Scott in November 2016 documented as normal.   He has not had DRE/ PSA recently.   He had blood work drawn by his PCP yesterday. It does not appear that his PSA was checked.   PMH: Past Medical History:  Diagnosis Date  . Allergic rhinitis   . Arthritis   . GERD (gastroesophageal reflux disease)   . Headache   . Heart murmur   . Hypertension   . Macular degeneration   . OSA (obstructive sleep apnea)   . Psoriasis     Surgical History: Past Surgical History:  Procedure Laterality Date  . CATARACT EXTRACTION    . NASAL SINUS SURGERY    . TONSILLECTOMY    . VASECTOMY      Home Medications:  Allergies as of 01/25/2017   No Known Allergies       Medication List       Accurate as of 01/25/17  4:01 PM. Always use your most recent med list.          albuterol 108 (90 Base) MCG/ACT inhaler Commonly known as:  PROVENTIL HFA;VENTOLIN HFA Inhale 2 puffs into the lungs every 6 (six) hours as needed for wheezing or shortness of breath.   amLODipine 10 MG tablet Commonly known as:  NORVASC TAKE 1 TABLET (10 MG TOTAL) BY MOUTH DAILY.   cholecalciferol 1000 units tablet Commonly known as:  VITAMIN D Take 1,000 Units by mouth daily.   fluticasone 50 MCG/ACT nasal spray Commonly known as:  FLONASE Place 2 sprays into both nostrils daily.   ICAPS AREDS 2 PO Take by mouth.   mometasone 0.1 % cream Commonly known as:  ELOCON APPLY TO AFFECTED AREA EVERY DAY   multivitamin capsule Take 1 capsule by mouth daily.   naproxen sodium 220 MG tablet Commonly known as:  ANAPROX Take 220 mg by mouth as needed.   omeprazole 20 MG capsule Commonly known as:  PRILOSEC TAKE 1 CAPSULE (20 MG TOTAL) BY MOUTH DAILY.   PROBIOTIC ADVANCED Caps Take by mouth.   SUPER B COMPLEX PO Take by mouth.   zolpidem 10 MG tablet Commonly known as:  AMBIEN Take 10 mg by mouth at bedtime as needed  for sleep.       Allergies: No Known Allergies  Family History: Family History  Problem Relation Age of Onset  . Arthritis      Parent  . Hypertension      Parent  . Diabetes      Parent  . Bladder Cancer Neg Hx   . Prostate cancer Neg Hx   . Kidney cancer Neg Hx     Social History:  reports that he has never smoked. He has never used smokeless tobacco. He reports that he drinks alcohol. He reports that he does not use drugs.  ROS: UROLOGY Frequent Urination?: No Hard to postpone urination?: No Burning/pain with urination?: No Get up at night to urinate?: Yes Leakage of urine?: No Urine stream starts and stops?: No Trouble starting stream?: No Do you have to strain to urinate?: No Blood in urine?: No Urinary tract infection?:  No Sexually transmitted disease?: No Injury to kidneys or bladder?: No Painful intercourse?: No Weak stream?: No Erection problems?: Yes Penile pain?: No  Gastrointestinal Nausea?: No Vomiting?: No Indigestion/heartburn?: No Diarrhea?: No Constipation?: No  Constitutional Fever: No Night sweats?: No Weight loss?: No Fatigue?: No  Skin Skin rash/lesions?: No Itching?: No  Eyes Blurred vision?: No Double vision?: No  Ears/Nose/Throat Sore throat?: No Sinus problems?: Yes  Hematologic/Lymphatic Swollen glands?: No Easy bruising?: No  Cardiovascular Leg swelling?: No Chest pain?: No  Respiratory Cough?: No Shortness of breath?: No  Endocrine Excessive thirst?: No  Musculoskeletal Back pain?: No Joint pain?: Yes  Neurological Headaches?: No Dizziness?: No  Psychologic Depression?: No Anxiety?: No  Physical Exam: BP 135/79 (BP Location: Left Arm, Patient Position: Sitting, Cuff Size: Large)   Pulse 77   Ht 6' (1.829 m)   Wt 261 lb 4.8 oz (118.5 kg)   BMI 35.44 kg/m   Constitutional:  Alert and oriented, No acute distress. HEENT: [place] AT, moist mucus membranes.  Trachea midline, no masses. Cardiovascular: No clubbing, cyanosis, or edema. Respiratory: Normal respiratory effort, no increased work of breathing. GI: Abdomen is soft, nontender, nondistended, no abdominal masses GU: No CVA tenderness.   Testicles descended bilaterally, no masses, nontender. Normal circumcised phallus with orthotopic patent urethral meatus. No palpable penile plaques.  Rectal exam:  Normal sphincter tone. Enlarged 50 cc prostate with rubbery nodule at apex, approximately 1 cm.   Skin: No rashes, bruises or suspicious lesions. Neurologic: Grossly intact, no focal deficits, moving all 4 extremities. Psychiatric: Normal mood and affect.  Laboratory Data: Lab Results  Component Value Date   WBC 7.8 09/21/2015   HCT 47.7 09/21/2015   MCV 92 09/21/2015   PLT 207  09/21/2015    Lab Results  Component Value Date   CREATININE 0.85 01/24/2017     Lab Results  Component Value Date   HGBA1C 6.2 01/24/2017    10/16 PSA 0.2 ng/ DL   Pertinent Imaging: n/a  Assessment & Plan:    1. Erectile dysfunction unresponsive to phosphodiesterase type 5 inhibitor Doing well with tri-mix #5, 90 units- requests refills today with 20 injections at a time  2. PSA screening Shared decision making today about continuation of screening, he desires to proceed Called PSA results  3. 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 Eliberto Ivory and Dr. Rolm Bookbinder continue to follow with PSAs/DRE   Return in about 1 year (around 01/25/2018) for ED/ PSA/ DRE.  Hollice Espy,  MD  [facility] [address] [place], [place] [address] [phone]

## 2017-01-24 NOTE — Telephone Encounter (Signed)
Patient would like to get Tetanus vaccine would you write script so patient can get vaccine . Thanks.

## 2017-01-25 ENCOUNTER — Ambulatory Visit (INDEPENDENT_AMBULATORY_CARE_PROVIDER_SITE_OTHER): Payer: Medicare Other | Admitting: Urology

## 2017-01-25 ENCOUNTER — Encounter: Payer: Self-pay | Admitting: Urology

## 2017-01-25 VITALS — BP 135/79 | HR 77 | Ht 72.0 in | Wt 261.3 lb

## 2017-01-25 DIAGNOSIS — N529 Male erectile dysfunction, unspecified: Secondary | ICD-10-CM | POA: Diagnosis not present

## 2017-01-25 DIAGNOSIS — Z125 Encounter for screening for malignant neoplasm of prostate: Secondary | ICD-10-CM | POA: Diagnosis not present

## 2017-01-25 DIAGNOSIS — N402 Nodular prostate without lower urinary tract symptoms: Secondary | ICD-10-CM | POA: Diagnosis not present

## 2017-01-25 DIAGNOSIS — H353211 Exudative age-related macular degeneration, right eye, with active choroidal neovascularization: Secondary | ICD-10-CM | POA: Diagnosis not present

## 2017-01-26 LAB — PSA: Prostate Specific Ag, Serum: 0.2 ng/mL (ref 0.0–4.0)

## 2017-01-29 ENCOUNTER — Telehealth: Payer: Self-pay

## 2017-01-29 NOTE — Telephone Encounter (Signed)
Spoke with pt in reference to PSA results. Pt voiced understanding.  

## 2017-01-29 NOTE — Telephone Encounter (Signed)
-----   Message from Hollice Espy, MD sent at 01/28/2017  7:56 PM EST ----- PSA 0.2 which is stable/ excellent. Plan to recheck in 1-2 years.    Hollice Espy, MD

## 2017-01-29 NOTE — Telephone Encounter (Signed)
Patient will call back to determine if he would like cholesterol med

## 2017-01-29 NOTE — Telephone Encounter (Signed)
-----   Message from Wayne Haven, MD sent at 01/26/2017  3:08 PM EST ----- Please let the patient know that his LDL cholesterol is slightly elevated at 113. Given his history of hypertension and his age he would benefit from a cholesterol medicine to help decrease this to help decrease his risk of stroke and heart attack. If he is willing to start on this I can send this into his pharmacy. He is also prediabetic. This is slightly worse than previously. He needs to work on his diet and exercise for this. His other lab work is acceptable. Thanks.

## 2017-01-30 NOTE — Telephone Encounter (Signed)
Left message to return call 

## 2017-01-31 NOTE — Telephone Encounter (Signed)
Left message to return call, unable to reach

## 2017-02-01 NOTE — Telephone Encounter (Signed)
Patient does not want to start the medicine at this time

## 2017-02-01 NOTE — Telephone Encounter (Signed)
Pt called back returning your call. Thank you!  Call pt @ [phone]

## 2017-02-06 ENCOUNTER — Telehealth: Payer: Self-pay

## 2017-02-06 NOTE — Telephone Encounter (Signed)
The prescription was called in to [facility].

## 2017-02-06 NOTE — Telephone Encounter (Signed)
-----   Message from Hollice Espy, MD sent at 01/25/2017  4:06 PM EST ----- Regarding: trimix Please call in this gentleman's prescription for tri-mix #5, 0.9 mL.  He would like 20 doses at at time.  Hollice Espy, MD

## 2017-02-08 ENCOUNTER — Other Ambulatory Visit: Payer: Self-pay | Admitting: Family Medicine

## 2017-02-09 ENCOUNTER — Other Ambulatory Visit: Payer: Self-pay | Admitting: Family Medicine

## 2017-02-13 DIAGNOSIS — M19042 Primary osteoarthritis, left hand: Secondary | ICD-10-CM | POA: Diagnosis not present

## 2017-02-13 DIAGNOSIS — M2550 Pain in unspecified joint: Secondary | ICD-10-CM | POA: Diagnosis not present

## 2017-02-13 DIAGNOSIS — L409 Psoriasis, unspecified: Secondary | ICD-10-CM | POA: Diagnosis not present

## 2017-02-13 DIAGNOSIS — M19041 Primary osteoarthritis, right hand: Secondary | ICD-10-CM | POA: Diagnosis not present

## 2017-02-20 ENCOUNTER — Ambulatory Visit: Payer: Medicare Other | Attending: Internal Medicine

## 2017-02-20 DIAGNOSIS — G4761 Periodic limb movement disorder: Secondary | ICD-10-CM | POA: Insufficient documentation

## 2017-02-20 DIAGNOSIS — Z6835 Body mass index (BMI) 35.0-35.9, adult: Secondary | ICD-10-CM | POA: Insufficient documentation

## 2017-02-20 DIAGNOSIS — G4733 Obstructive sleep apnea (adult) (pediatric): Secondary | ICD-10-CM | POA: Insufficient documentation

## 2017-03-04 ENCOUNTER — Telehealth: Payer: Self-pay | Admitting: *Deleted

## 2017-03-04 DIAGNOSIS — G4733 Obstructive sleep apnea (adult) (pediatric): Secondary | ICD-10-CM

## 2017-03-04 NOTE — Telephone Encounter (Signed)
Left message to return call 

## 2017-03-04 NOTE — Telephone Encounter (Signed)
Patient has requested a call at [phone].

## 2017-03-05 NOTE — Telephone Encounter (Signed)
Patient states he received a call, informed patient I have not called him

## 2017-03-05 NOTE — Telephone Encounter (Signed)
His study revealed sleep apnea and gave a optimal CPAP pressure of 10 cm H20. Please see if he knows what his current setting is. Thanks.

## 2017-03-05 NOTE — Telephone Encounter (Signed)
Please advise 

## 2017-03-05 NOTE — Telephone Encounter (Signed)
Pt called back returning your call. Thank you!  Call pt @ [phone]

## 2017-03-05 NOTE — Telephone Encounter (Signed)
Patient requested to know if his sleep study results are available  Pt contact [phone]

## 2017-03-05 NOTE — Telephone Encounter (Signed)
[facility] in [place] set up the cpap last year, he would like for [place] to set this up for him.

## 2017-03-06 NOTE — Telephone Encounter (Signed)
New order signed. We'll forward to Melissa to get this to his home health company.

## 2017-03-07 NOTE — Telephone Encounter (Signed)
Order faxed to [facility]

## 2017-03-15 DIAGNOSIS — H353211 Exudative age-related macular degeneration, right eye, with active choroidal neovascularization: Secondary | ICD-10-CM | POA: Diagnosis not present

## 2017-03-27 DIAGNOSIS — L4050 Arthropathic psoriasis, unspecified: Secondary | ICD-10-CM | POA: Diagnosis not present

## 2017-03-27 DIAGNOSIS — M25562 Pain in left knee: Secondary | ICD-10-CM | POA: Diagnosis not present

## 2017-03-27 DIAGNOSIS — L409 Psoriasis, unspecified: Secondary | ICD-10-CM | POA: Diagnosis not present

## 2017-03-27 DIAGNOSIS — Z79899 Other long term (current) drug therapy: Secondary | ICD-10-CM | POA: Diagnosis not present

## 2017-03-29 DIAGNOSIS — H353222 Exudative age-related macular degeneration, left eye, with inactive choroidal neovascularization: Secondary | ICD-10-CM | POA: Diagnosis not present

## 2017-04-26 DIAGNOSIS — H353211 Exudative age-related macular degeneration, right eye, with active choroidal neovascularization: Secondary | ICD-10-CM | POA: Diagnosis not present

## 2017-04-30 ENCOUNTER — Telehealth: Payer: Self-pay | Admitting: Family Medicine

## 2017-04-30 NOTE — Telephone Encounter (Signed)
Spoke to pt wife. She will have him call me back to schedule AWV.

## 2017-05-07 ENCOUNTER — Other Ambulatory Visit: Payer: Self-pay | Admitting: Family Medicine

## 2017-05-15 ENCOUNTER — Other Ambulatory Visit: Payer: Self-pay | Admitting: Family Medicine

## 2017-06-05 DIAGNOSIS — H353211 Exudative age-related macular degeneration, right eye, with active choroidal neovascularization: Secondary | ICD-10-CM | POA: Diagnosis not present

## 2017-06-18 NOTE — Telephone Encounter (Signed)
Scheduled 10/23/17

## 2017-06-19 DIAGNOSIS — L4050 Arthropathic psoriasis, unspecified: Secondary | ICD-10-CM | POA: Diagnosis not present

## 2017-06-19 DIAGNOSIS — Z79899 Other long term (current) drug therapy: Secondary | ICD-10-CM | POA: Diagnosis not present

## 2017-06-21 DIAGNOSIS — H353222 Exudative age-related macular degeneration, left eye, with inactive choroidal neovascularization: Secondary | ICD-10-CM | POA: Diagnosis not present

## 2017-07-02 ENCOUNTER — Encounter: Payer: Self-pay | Admitting: Family Medicine

## 2017-07-02 ENCOUNTER — Ambulatory Visit (INDEPENDENT_AMBULATORY_CARE_PROVIDER_SITE_OTHER): Payer: Medicare Other | Admitting: Family Medicine

## 2017-07-02 DIAGNOSIS — K5900 Constipation, unspecified: Secondary | ICD-10-CM

## 2017-07-02 DIAGNOSIS — I10 Essential (primary) hypertension: Secondary | ICD-10-CM | POA: Diagnosis not present

## 2017-07-02 DIAGNOSIS — G479 Sleep disorder, unspecified: Secondary | ICD-10-CM | POA: Diagnosis not present

## 2017-07-02 DIAGNOSIS — J069 Acute upper respiratory infection, unspecified: Secondary | ICD-10-CM

## 2017-07-02 MED ORDER — POLYETHYLENE GLYCOL 3350 17 GM/SCOOP PO POWD: 17.0000 g | Freq: Every day | ORAL | 1 refills | Status: DC | PRN

## 2017-07-02 NOTE — Patient Instructions (Signed)
Nice to see you. Please try the MiraLAX for constipation. If you develop abdominal pain or worsening constipation please let us know. Please try the Nasonex for your sinuses. If you continue to have issues with this please let us know. If you have excessive drowsiness or you feel that she needed to take the Ambien more frequently please let us know as well so we can try an alternative medication.

## 2017-07-02 NOTE — Assessment & Plan Note (Signed)
At goal. Continue amlodipine.  

## 2017-07-02 NOTE — Assessment & Plan Note (Signed)
Benign abdominal exam. Discussed that this could be related to his amlodipine. Offered to change this though he's has been given difficulty finding blood pressure medicines that worked for him in the past. We'll treat with MiraLAX. He'll do this daily for a week. If constipation returns we will need to consider altering his hypertension regimen.

## 2017-07-02 NOTE — Progress Notes (Signed)
Tommi Rumps, MD Phone: [phone]  Wayne Scott is a 66 y.o. male who presents today for f/u.  HYPERTENSION  Disease Monitoring  Home BP Monitoring similar to today Chest pain- no    Dyspnea- no Medications  Compliance-  taking amlodipine.  Edema- ray or if he stands for too long, goes away overnight, no orthopnea  Constipation: Notes he's had this intermittent me for years notes worsened somewhat over the last 6 months. He notes a left bowel movement every couple of days. Notes his bowel movements are small chunks. No blood in the stool. No abdominal pain though does occasionally feel bloated. Last bowel movement was today. Milk of magnesia helps with this. No diarrhea.  Patient notes about a week ago he developed sinus and nasal congestion with difficulty breathing out of his nose. Felt some vertigo as well. Notes this went away later in the day. He took a sinus pill the next day and his symptoms have progressively improved. He started on Nasonex 2 days ago and this is helped quite a bit as well. No vertigo now. Notes his sinus congestion is improving. Now blowing anything out of his nose. No fevers.  Sleep difficulty: Rarely takes his Ambien. Only takes 5 mg. Does take it once a month. Makes him a little drowsy the next day though he does not drive with this.   PMH: nonsmoker.   ROS see history of present illness  Objective  Physical Exam Vitals:   07/02/17 0916  BP: 124/80  Pulse: 73  Temp: 97.9 F (36.6 C)    BP Readings from Last 3 Encounters:  07/02/17 124/80  01/25/17 135/79  01/18/17 122/80   Wt Readings from Last 3 Encounters:  07/02/17 248 lb 3.2 oz (112.6 kg)  01/25/17 261 lb 4.8 oz (118.5 kg)  01/18/17 261 lb (118.4 kg)    Physical Exam  Constitutional: No distress.  HENT:  Head: Normocephalic and atraumatic.  Mouth/Throat: Oropharynx is clear and moist. No oropharyngeal exudate.  Normal TMs bilaterally, no sinus tenderness to percussion    Eyes: Pupils are equal, round, and reactive to light. Conjunctivae are normal.  Cardiovascular: Normal rate, regular rhythm and normal heart sounds.   Pulmonary/Chest: Effort normal and breath sounds normal.  Abdominal: Soft. Bowel sounds are normal. He exhibits no distension. There is no tenderness. There is no rebound and no guarding.  Neurological: He is alert. Gait normal.  CN 2-12 intact, 5/5 strength in bilateral biceps, triceps, grip, quads, hamstrings, plantar and dorsiflexion, sensation to light touch intact in bilateral UE and LE  Skin: Skin is warm and dry. He is not diaphoretic.     Assessment/Plan: Please see individual problem list.  Essential hypertension At goal. Continue amlodipine.  Constipation Benign abdominal exam. Discussed that this could be related to his amlodipine. Offered to change this though he's has been given difficulty finding blood pressure medicines that worked for him in the past. We'll treat with MiraLAX. He'll do this daily for a week. If constipation returns we will need to consider altering his hypertension regimen.  URI (upper respiratory infection) Suspect URI or allergies as cause of his sinus symptoms and vertigo. He has benign exam today. Benign neurological exam. Discussed continuing Nasonex. Monitor for recurrence of symptoms.  Sleeping difficulty Rarely takes Ambien. Discussed not driving if this made him drowsy. If he requires this more in the future we will need to consider an alternative medication if it continues to make her drowsy.   No orders  of the defined types were placed in this encounter.   Meds ordered this encounter  Medications  . methotrexate (RHEUMATREX) 2.5 MG tablet    Sig: Take 2.5 mg by mouth. Take 8 tablets a week  . folic acid (FOLVITE) 1 MG tablet    Sig: Take by mouth.  . polyethylene glycol powder (GLYCOLAX/MIRALAX) powder    Sig: Take 17 g by mouth daily as needed for mild constipation.    Dispense:  3350 g     Refill:  [place], MD [place]

## 2017-07-02 NOTE — Assessment & Plan Note (Signed)
Suspect URI or allergies as cause of his sinus symptoms and vertigo. He has benign exam today. Benign neurological exam. Discussed continuing Nasonex. Monitor for recurrence of symptoms.

## 2017-07-02 NOTE — Assessment & Plan Note (Signed)
Rarely takes Ambien. Discussed not driving if this made him drowsy. If he requires this more in the future we will need to consider an alternative medication if it continues to make her drowsy.

## 2017-07-08 DIAGNOSIS — Z79899 Other long term (current) drug therapy: Secondary | ICD-10-CM | POA: Diagnosis not present

## 2017-07-08 DIAGNOSIS — L4050 Arthropathic psoriasis, unspecified: Secondary | ICD-10-CM | POA: Diagnosis not present

## 2017-07-08 DIAGNOSIS — L409 Psoriasis, unspecified: Secondary | ICD-10-CM | POA: Diagnosis not present

## 2017-07-19 DIAGNOSIS — H353211 Exudative age-related macular degeneration, right eye, with active choroidal neovascularization: Secondary | ICD-10-CM | POA: Diagnosis not present

## 2017-08-12 ENCOUNTER — Other Ambulatory Visit: Payer: Self-pay | Admitting: Family Medicine

## 2017-08-27 DIAGNOSIS — H353211 Exudative age-related macular degeneration, right eye, with active choroidal neovascularization: Secondary | ICD-10-CM | POA: Diagnosis not present

## 2017-09-17 DIAGNOSIS — H353211 Exudative age-related macular degeneration, right eye, with active choroidal neovascularization: Secondary | ICD-10-CM | POA: Diagnosis not present

## 2017-09-17 DIAGNOSIS — H353222 Exudative age-related macular degeneration, left eye, with inactive choroidal neovascularization: Secondary | ICD-10-CM | POA: Diagnosis not present

## 2017-09-20 DIAGNOSIS — Z79899 Other long term (current) drug therapy: Secondary | ICD-10-CM | POA: Diagnosis not present

## 2017-09-20 DIAGNOSIS — L409 Psoriasis, unspecified: Secondary | ICD-10-CM | POA: Diagnosis not present

## 2017-09-20 DIAGNOSIS — L4050 Arthropathic psoriasis, unspecified: Secondary | ICD-10-CM | POA: Diagnosis not present

## 2017-09-24 DIAGNOSIS — Z23 Encounter for immunization: Secondary | ICD-10-CM | POA: Diagnosis not present

## 2017-10-08 DIAGNOSIS — H353211 Exudative age-related macular degeneration, right eye, with active choroidal neovascularization: Secondary | ICD-10-CM | POA: Diagnosis not present

## 2017-10-23 ENCOUNTER — Ambulatory Visit (INDEPENDENT_AMBULATORY_CARE_PROVIDER_SITE_OTHER): Payer: Medicare Other | Admitting: Family Medicine

## 2017-10-23 ENCOUNTER — Other Ambulatory Visit: Payer: Self-pay

## 2017-10-23 ENCOUNTER — Encounter: Payer: Self-pay | Admitting: Family Medicine

## 2017-10-23 VITALS — BP 120/88 | HR 77 | Temp 98.1°F | Wt 260.6 lb

## 2017-10-23 DIAGNOSIS — K5900 Constipation, unspecified: Secondary | ICD-10-CM

## 2017-10-23 DIAGNOSIS — E669 Obesity, unspecified: Secondary | ICD-10-CM

## 2017-10-23 DIAGNOSIS — E785 Hyperlipidemia, unspecified: Secondary | ICD-10-CM | POA: Diagnosis not present

## 2017-10-23 DIAGNOSIS — R7303 Prediabetes: Secondary | ICD-10-CM

## 2017-10-23 DIAGNOSIS — I10 Essential (primary) hypertension: Secondary | ICD-10-CM

## 2017-10-23 DIAGNOSIS — L4050 Arthropathic psoriasis, unspecified: Secondary | ICD-10-CM | POA: Diagnosis not present

## 2017-10-23 DIAGNOSIS — G4733 Obstructive sleep apnea (adult) (pediatric): Secondary | ICD-10-CM | POA: Diagnosis not present

## 2017-10-23 DIAGNOSIS — G479 Sleep disorder, unspecified: Secondary | ICD-10-CM | POA: Diagnosis not present

## 2017-10-23 LAB — COMPREHENSIVE METABOLIC PANEL
ALT: 26 U/L (ref 0–53)
AST: 23 U/L (ref 0–37)
Albumin: 4.4 g/dL (ref 3.5–5.2)
Alkaline Phosphatase: 76 U/L (ref 39–117)
BUN: 16 mg/dL (ref 6–23)
CO2: 27 mEq/L (ref 19–32)
Calcium: 9.4 mg/dL (ref 8.4–10.5)
Chloride: 100 mEq/L (ref 96–112)
Creatinine, Ser: 0.88 mg/dL (ref 0.40–1.50)
GFR: 91.81 mL/min (ref >60.00)
Glucose, Bld: 117 mg/dL — ABNORMAL HIGH (ref 70–99)
Potassium: 4.2 mEq/L (ref 3.5–5.1)
Sodium: 136 mEq/L (ref 135–145)
Total Bilirubin: 1 mg/dL (ref 0.2–1.2)
Total Protein: 7.2 g/dL (ref 6.0–8.3)

## 2017-10-23 LAB — HEMOGLOBIN A1C: Hgb A1c MFr Bld: 6 % (ref 4.6–6.5)

## 2017-10-23 LAB — LDL CHOLESTEROL, DIRECT: Direct LDL: 117 mg/dL

## 2017-10-23 MED ORDER — TRAZODONE HCL 50 MG PO TABS: 25.0000 mg | ORAL_TABLET | Freq: Every evening | ORAL | 0 refills | Status: DC | PRN

## 2017-10-23 MED ORDER — ZALEPLON 5 MG PO CAPS: 5.0000 mg | ORAL_CAPSULE | Freq: Every evening | ORAL | 0 refills | Status: DC | PRN

## 2017-10-23 NOTE — Progress Notes (Signed)
Wayne Rumps, MD Phone: [phone]  JURGEN GROENEVELD is a 66 y.o. male who presents today for follow-up.  Hypertension: Similar to today.  Sometimes lower.  Taking amlodipine.  No chest pain, shortness of breath, or edema.  Obstructive sleep apnea: Using his CPAP nightly.  Uses for 5-6 hours.  Sleep not quite what he wants.  No drowsiness during the day.  Does not always wake up well rested.  Notes settings were changed on his CPAP over the summer and he has been doing better since then.  Takes Ambien about once a month.  Takes 5 mg.  He does wake up foggy the next day.  His constipation improved with MiraLAX and daily fiber.  Has a daily bowel movement.  Obesity: He did lose some weight over the summer though gained it back given that he has not been exercising as much and has been eating more.  Plans to start exercising more and watching his diet.  He is on methotrexate for psoriatic arthritis.  Also taking folic acid with this.  Follows with rheumatology.  He has frequent eye exams given that he has macular degeneration.  He is interested in cologuard for colon cancer screening.  No family history of colon cancer.  No rectal bleeding.  No polyps previously.  Social History   Tobacco Use  Smoking Status Never Smoker  Smokeless Tobacco Never Used     ROS see history of present illness  Objective  Physical Exam Vitals:   10/23/17 1030  BP: 120/88  Pulse: 77  Temp: 98.1 F (36.7 C)  SpO2: 98%    BP Readings from Last 3 Encounters:  10/23/17 120/88  07/02/17 124/80  01/25/17 135/79   Wt Readings from Last 3 Encounters:  10/23/17 260 lb 9.6 oz (118.2 kg)  07/02/17 248 lb 3.2 oz (112.6 kg)  01/25/17 261 lb 4.8 oz (118.5 kg)    Physical Exam  Constitutional: No distress.  Cardiovascular: Normal rate, regular rhythm and normal heart sounds.  Pulmonary/Chest: Effort normal and breath sounds normal.  Abdominal: Soft. Bowel sounds are normal. He exhibits no  distension. There is no tenderness.  Musculoskeletal: He exhibits no edema.  Neurological: He is alert. Gait normal.  Skin: Skin is warm and dry. He is not diaphoretic.     Assessment/Plan: Please see individual problem list.  Essential hypertension Controlled.  Continue current regimen.  Obstructive sleep apnea Improved with recently changed CPAP settings.  Continue to monitor.  Psoriatic arthritis [facility]) He will continue to follow with rheumatology.  Constipation Improved.  Continue to monitor.  Sleeping difficulty Drowsiness when he takes Ambien.  We will trial trazodone.  Obesity (BMI 30-39.9) Discussed diet and exercise.   Blaize was seen today for follow-up.  Diagnoses and all orders for this visit:  Prediabetes -     HgB A1c  Hyperlipidemia, unspecified hyperlipidemia type -     Comp Met (CMET) -     Direct LDL  Essential hypertension  Obstructive sleep apnea  Psoriatic arthritis (HCC)  Constipation, unspecified constipation type  Sleeping difficulty  Obesity (BMI 30-39.9)  Other orders -     Discontinue: zaleplon (SONATA) 5 MG capsule; Take 1 capsule (5 mg total) by mouth at bedtime as needed for sleep. -     traZODone (DESYREL) 50 MG tablet; Take 0.5-1 tablets (25-50 mg total) by mouth at bedtime as needed for sleep.    Orders Placed This Encounter  Procedures  . Comp Met (CMET)  . HgB A1c  .  Direct LDL    Meds ordered this encounter  Medications  . DISCONTD: zaleplon (SONATA) 5 MG capsule    Sig: Take 1 capsule (5 mg total) by mouth at bedtime as needed for sleep.    Dispense:  15 capsule    Refill:  0  . traZODone (DESYREL) 50 MG tablet    Sig: Take 0.5-1 tablets (25-50 mg total) by mouth at bedtime as needed for sleep.    Dispense:  30 tablet    Refill:  0   Cologuard ordered.  Wayne Rumps, MD [place]

## 2017-10-23 NOTE — Assessment & Plan Note (Signed)
Discussed diet and exercise 

## 2017-10-23 NOTE — Assessment & Plan Note (Signed)
Improved with recently changed CPAP settings.  Continue to monitor.

## 2017-10-23 NOTE — Assessment & Plan Note (Signed)
He will continue to follow with rheumatology. 

## 2017-10-23 NOTE — Patient Instructions (Signed)
Nice to see you. Please work on diet and exercise. We will try trazodone for your sleep. Please continue your CPAP. Please check with your insurance company regarding cologuard.

## 2017-10-23 NOTE — Assessment & Plan Note (Signed)
Controlled. Continue current regimen. 

## 2017-10-23 NOTE — Assessment & Plan Note (Signed)
Improved. Continue to monitor. 

## 2017-10-23 NOTE — Assessment & Plan Note (Signed)
Drowsiness when he takes Ambien.  We will trial trazodone.

## 2017-10-24 ENCOUNTER — Telehealth: Payer: Self-pay

## 2017-10-24 DIAGNOSIS — H40003 Preglaucoma, unspecified, bilateral: Secondary | ICD-10-CM | POA: Diagnosis not present

## 2017-10-24 NOTE — Telephone Encounter (Signed)
Left message to return call, ok for [facility] to give results and speak to patient  

## 2017-10-24 NOTE — Telephone Encounter (Signed)
-----   Message from Leone Haven, MD sent at 10/23/2017  7:46 PM EST ----- Please let the patient know his cholesterol remains unchanged from prior.  Given his hypertension and age I would suggest starting the patient.  If he does not want to do this he needs to work on diet and exercise.  Thanks.

## 2017-10-24 NOTE — Telephone Encounter (Signed)
Clarification  On instructions

## 2017-10-24 NOTE — Telephone Encounter (Signed)
Results given to pt per previous notes of Dr. Caryl Bis. Pt verbalized understanding and states at this time he wants to work on diet and exercise before starting cholesterol medication. No additional concerns voiced at this time.

## 2017-10-24 NOTE — Telephone Encounter (Signed)
Please see if patient would like to start cholesterol medication due to his cholesterol having been unchanged

## 2017-10-24 NOTE — Telephone Encounter (Signed)
FYI

## 2017-10-24 NOTE — Telephone Encounter (Signed)
Noted  

## 2017-10-29 DIAGNOSIS — L409 Psoriasis, unspecified: Secondary | ICD-10-CM | POA: Diagnosis not present

## 2017-10-29 DIAGNOSIS — Z79899 Other long term (current) drug therapy: Secondary | ICD-10-CM | POA: Diagnosis not present

## 2017-10-29 DIAGNOSIS — R1012 Left upper quadrant pain: Secondary | ICD-10-CM | POA: Diagnosis not present

## 2017-10-29 DIAGNOSIS — L4050 Arthropathic psoriasis, unspecified: Secondary | ICD-10-CM | POA: Diagnosis not present

## 2017-10-30 ENCOUNTER — Ambulatory Visit: Payer: Self-pay | Admitting: Family

## 2017-10-31 ENCOUNTER — Other Ambulatory Visit: Payer: Self-pay

## 2017-10-31 ENCOUNTER — Encounter: Payer: Self-pay | Admitting: Emergency Medicine

## 2017-10-31 DIAGNOSIS — N132 Hydronephrosis with renal and ureteral calculous obstruction: Secondary | ICD-10-CM | POA: Insufficient documentation

## 2017-10-31 DIAGNOSIS — Z79899 Other long term (current) drug therapy: Secondary | ICD-10-CM | POA: Insufficient documentation

## 2017-10-31 DIAGNOSIS — I10 Essential (primary) hypertension: Secondary | ICD-10-CM | POA: Insufficient documentation

## 2017-10-31 DIAGNOSIS — R1032 Left lower quadrant pain: Secondary | ICD-10-CM | POA: Diagnosis present

## 2017-10-31 DIAGNOSIS — K5730 Diverticulosis of large intestine without perforation or abscess without bleeding: Secondary | ICD-10-CM | POA: Diagnosis not present

## 2017-10-31 DIAGNOSIS — Z1211 Encounter for screening for malignant neoplasm of colon: Secondary | ICD-10-CM | POA: Diagnosis not present

## 2017-10-31 DIAGNOSIS — Z1212 Encounter for screening for malignant neoplasm of rectum: Secondary | ICD-10-CM | POA: Diagnosis not present

## 2017-10-31 LAB — COMPREHENSIVE METABOLIC PANEL
ALT: 25 U/L (ref 17–63)
AST: 26 U/L (ref 15–41)
Albumin: 4.1 g/dL (ref 3.5–5.0)
Alkaline Phosphatase: 75 U/L (ref 38–126)
Anion gap: 13 (ref 5–15)
BUN: 17 mg/dL (ref 6–20)
CO2: 25 mmol/L (ref 22–32)
Calcium: 9.8 mg/dL (ref 8.9–10.3)
Chloride: 101 mmol/L (ref 101–111)
Creatinine, Ser: 1.1 mg/dL (ref 0.61–1.24)
GFR calc Af Amer: >60 mL/min (ref >60)
GFR calc non Af Amer: >60 mL/min (ref >60)
Glucose, Bld: 129 mg/dL — ABNORMAL HIGH (ref 65–99)
Potassium: 3.7 mmol/L (ref 3.5–5.1)
Sodium: 139 mmol/L (ref 135–145)
Total Bilirubin: 0.8 mg/dL (ref 0.3–1.2)
Total Protein: 7.2 g/dL (ref 6.5–8.1)

## 2017-10-31 LAB — URINALYSIS, COMPLETE (UACMP) WITH MICROSCOPIC
Bacteria, UA: NONE SEEN
Bilirubin Urine: NEGATIVE
Glucose, UA: NEGATIVE mg/dL
Hgb urine dipstick: NEGATIVE
Ketones, ur: NEGATIVE mg/dL
Leukocytes, UA: NEGATIVE
Nitrite: NEGATIVE
Protein, ur: NEGATIVE mg/dL
Specific Gravity, Urine: 1.011 (ref 1.005–1.030)
Squamous Epithelial / LPF: NONE SEEN
pH: 7 (ref 5.0–8.0)

## 2017-10-31 LAB — COLOGUARD: Cologuard: NEGATIVE

## 2017-10-31 LAB — LIPASE, BLOOD: Lipase: 39 U/L (ref 11–51)

## 2017-10-31 LAB — CBC
HCT: 48.8 % (ref 40.0–52.0)
Hemoglobin: 16.5 g/dL (ref 13.0–18.0)
MCH: 31.3 pg (ref 26.0–34.0)
MCHC: 33.7 g/dL (ref 32.0–36.0)
MCV: 92.9 fL (ref 80.0–100.0)
Platelets: 212 10*3/uL (ref 150–440)
RBC: 5.26 MIL/uL (ref 4.40–5.90)
RDW: 13.5 % (ref 11.5–14.5)
WBC: 13 10*3/uL — ABNORMAL HIGH (ref 3.8–10.6)

## 2017-10-31 NOTE — ED Triage Notes (Signed)
Pt presents to ED with nausea and left lower abd pain since Saturday. abd is not painful to touch. Hx of constipation. Pt states he has taken milk of magnesia X2 since the onset of his symptoms, and after having a bowel movement, symptoms improved for the rest of the day and then return again around bedtime. Pain increased again tonight after eating.

## 2017-11-01 ENCOUNTER — Emergency Department: Payer: Medicare Other

## 2017-11-01 ENCOUNTER — Emergency Department
Admission: EM | Admit: 2017-11-01 | Discharge: 2017-11-01 | Disposition: A | Discharge status: Home / Self Care | Payer: Medicare Other | Attending: Emergency Medicine | Admitting: Emergency Medicine

## 2017-11-01 DIAGNOSIS — K5730 Diverticulosis of large intestine without perforation or abscess without bleeding: Secondary | ICD-10-CM | POA: Diagnosis not present

## 2017-11-01 DIAGNOSIS — N132 Hydronephrosis with renal and ureteral calculous obstruction: Secondary | ICD-10-CM | POA: Diagnosis not present

## 2017-11-01 DIAGNOSIS — N200 Calculus of kidney: Secondary | ICD-10-CM

## 2017-11-01 IMAGING — CT CT ABD-PELV W/ CM
2 of 5 series · 16 of 46 positions shown, 18 images · IV contrast (APPLIED)
Comparison: None.

CLINICAL DATA: 66-year-old male with abdominal pain. Concern for
diverticulitis.

EXAM:
CT ABDOMEN AND PELVIS WITH CONTRAST
TECHNIQUE: Multidetector CT imaging of the abdomen and pelvis was performed
using the standard protocol following bolus administration of
intravenous contrast.
CONTRAST:  100mL [Z5] IOPAMIDOL ([Z5]) INJECTION 61%

[Series 2: routine abd/pel with · axial · 0.92mm/px · z∈[-854,-424]mm · 13 of 98 slices shown, 15 images]
[im 6/98  soft-tissue]
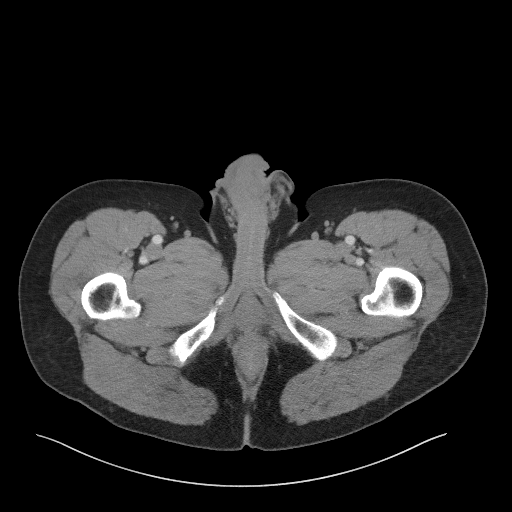
[im 6/98  bone]
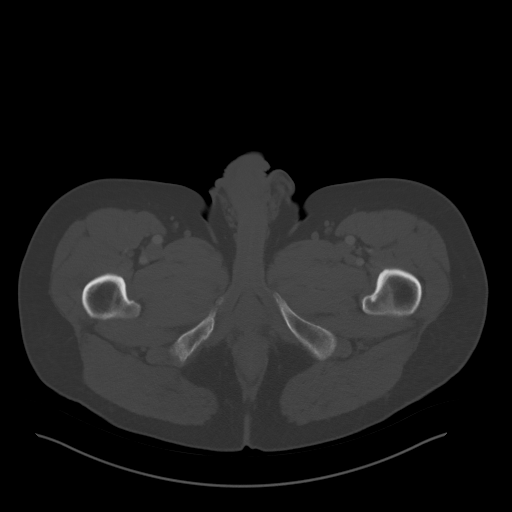
[im 16/98  soft-tissue]
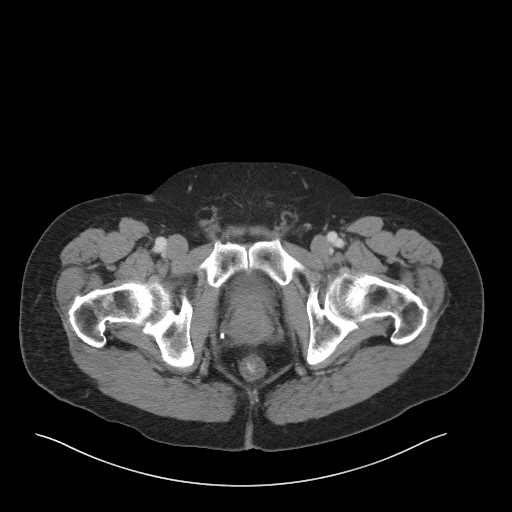
[im 21/98  soft-tissue]
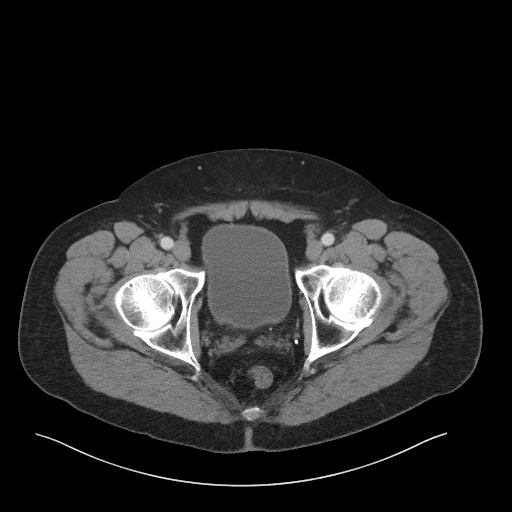
[im 26/98  soft-tissue]
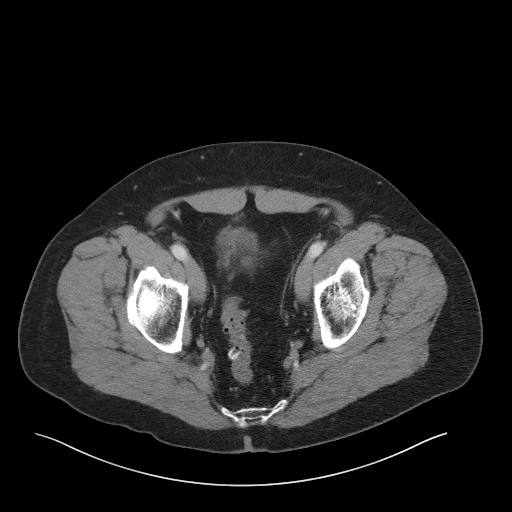
[im 36/98  soft-tissue]
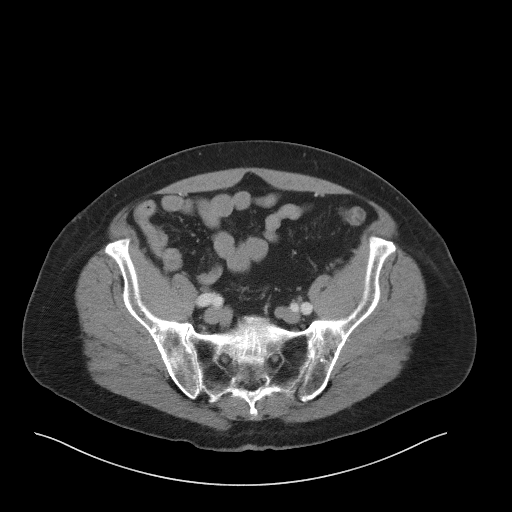
[im 41/98  soft-tissue]
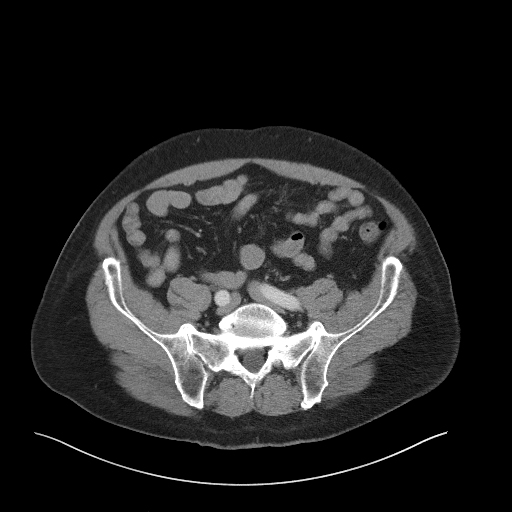
[im 52/98  soft-tissue]
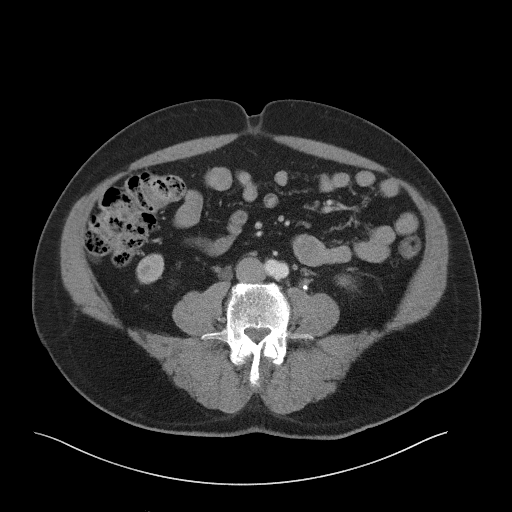
[im 57/98  soft-tissue]
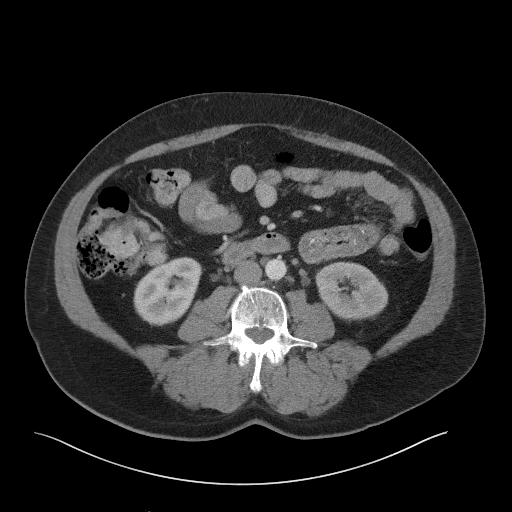
[im 62/98  soft-tissue]
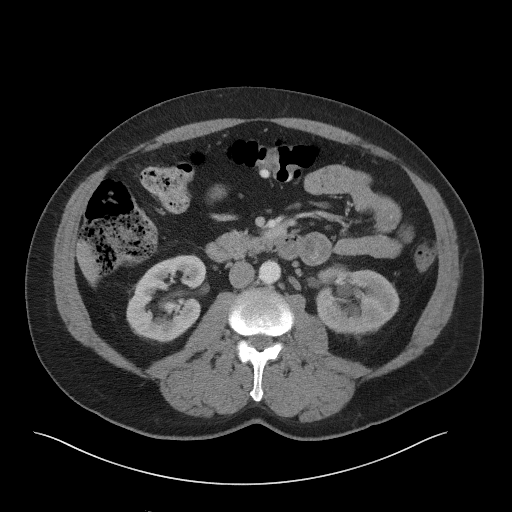
[im 62/98  bone]
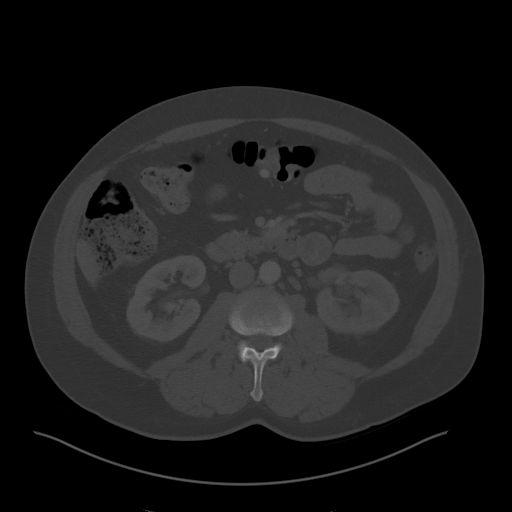
[im 72/98  soft-tissue]
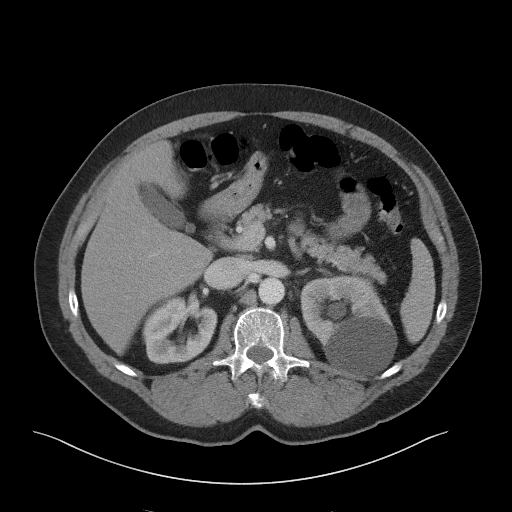
[im 77/98  soft-tissue]
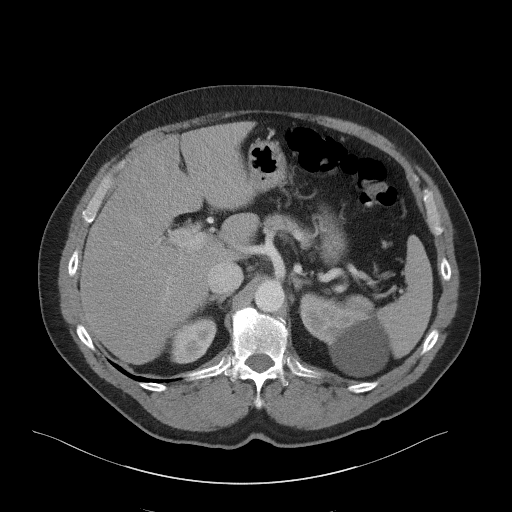
[im 82/98  soft-tissue]
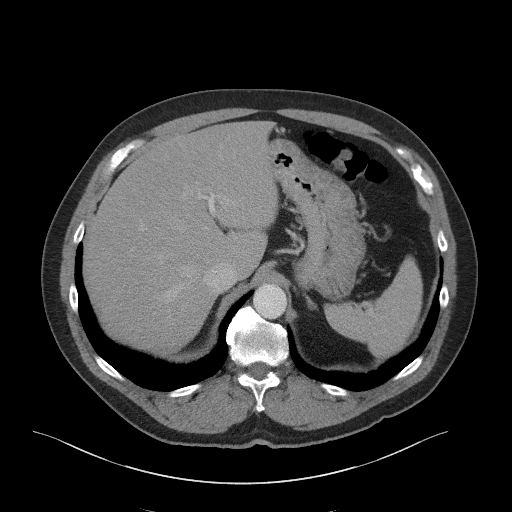
[im 92/98  soft-tissue]
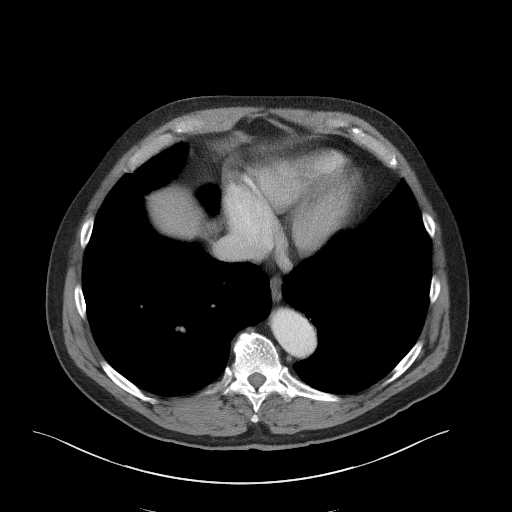

[Series 6: coronal st · coronal · 0.85mm/px · 3 of 101 slices shown]
[im 34/101  soft-tissue]
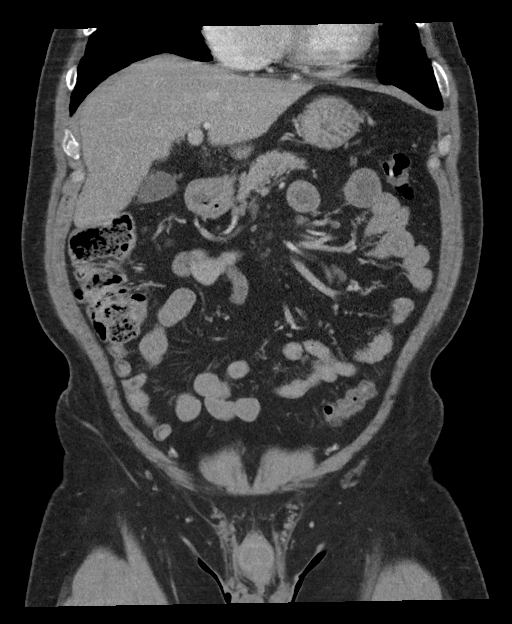
[im 45/101  soft-tissue]
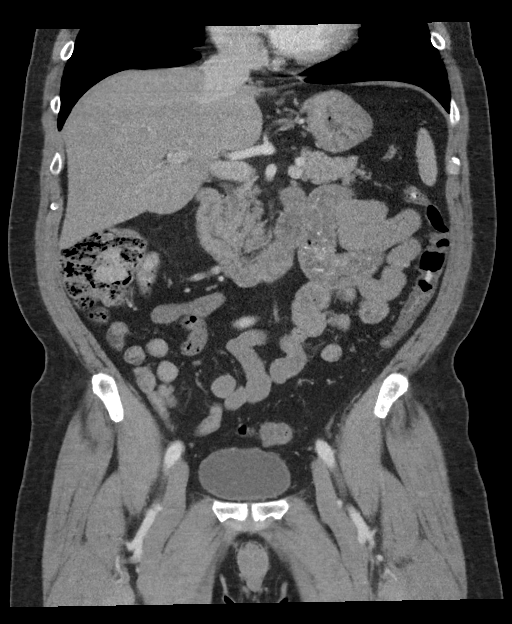
[im 56/101  soft-tissue]
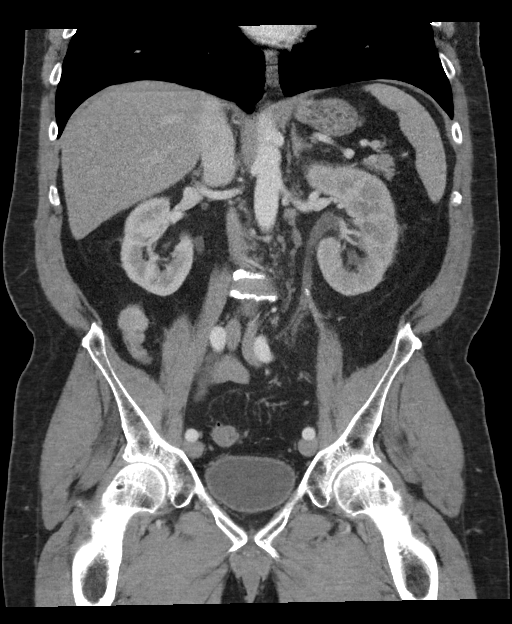

[16 of 46 positions shown; findings below may reference images not displayed]

FINDINGS: Lower chest: The visualized lung bases are clear.

No intra-abdominal free air or free fluid.

Hepatobiliary: Probable mild fatty infiltration of the liver. No
intrahepatic biliary ductal dilatation. The gallbladder is
unremarkable.

Pancreas: Unremarkable. No pancreatic ductal dilatation or
surrounding inflammatory changes.

Spleen: Normal in size without focal abnormality.

Adrenals/Urinary Tract: The adrenal glands are unremarkable. There
is a 6 mm stone in the proximal left ureter with mild left
hydronephrosis. There is a 6.8 cm left renal upper pole cyst. The
right kidney is unremarkable. Mild trabeculated appearance of the
bladder wall.

Stomach/Bowel: There is sigmoid diverticulosis without active
inflammatory changes. There is no evidence of bowel obstruction or
active inflammation. The appendix is not visualized with certainty.
No inflammatory changes identified in the right lower quadrant.

Vascular/Lymphatic: The abdominal aorta and IVC appear unremarkable.
No portal venous gas. There is no adenopathy.

Reproductive: The prostate and seminal vesicles are grossly
unremarkable.

Other: Small fat containing umbilical hernia for

Musculoskeletal: Degenerative changes of the spine. No acute osseous
pathology.
IMPRESSION: 1. A 6 mm stone in the proximal left ureter with mild left
hydronephrosis.
2. Sigmoid diverticulosis. No bowel obstruction or active
inflammation.

## 2017-11-01 MED ORDER — ONDANSETRON 4 MG PO TBDP: 4.0000 mg | ORAL_TABLET | Freq: Three times a day (TID) | ORAL | 0 refills | Status: DC | PRN

## 2017-11-01 MED ORDER — OXYCODONE-ACETAMINOPHEN 5-325 MG PO TABS: 1.0000 | ORAL_TABLET | Freq: Four times a day (QID) | ORAL | 0 refills | Status: DC | PRN

## 2017-11-01 MED ORDER — MORPHINE SULFATE (PF) 4 MG/ML IV SOLN
4.0000 mg | Freq: Once | INTRAVENOUS | Status: AC
  Administered 2017-11-01: 4 mg via INTRAVENOUS
  Filled 2017-11-01: qty 1

## 2017-11-01 MED ORDER — SODIUM CHLORIDE 0.9 % IV BOLUS (SEPSIS)
1000.0000 mL | Freq: Once | INTRAVENOUS | Status: AC
  Administered 2017-11-01: 1000 mL via INTRAVENOUS

## 2017-11-01 MED ORDER — OXYCODONE-ACETAMINOPHEN 5-325 MG PO TABS
2.0000 | ORAL_TABLET | Freq: Once | ORAL | Status: AC
  Administered 2017-11-01: 2 via ORAL
  Filled 2017-11-01: qty 2

## 2017-11-01 MED ORDER — IOPAMIDOL (ISOVUE-300) INJECTION 61%
100.0000 mL | Freq: Once | INTRAVENOUS | Status: AC | PRN
  Administered 2017-11-01: 100 mL via INTRAVENOUS

## 2017-11-01 MED ORDER — TAMSULOSIN HCL 0.4 MG PO CAPS: 0.4000 mg | ORAL_CAPSULE | Freq: Every day | ORAL | 0 refills | Status: DC

## 2017-11-01 MED ORDER — ONDANSETRON HCL 4 MG/2ML IJ SOLN
4.0000 mg | Freq: Once | INTRAMUSCULAR | Status: AC
  Administered 2017-11-01: 4 mg via INTRAVENOUS
  Filled 2017-11-01: qty 2

## 2017-11-01 NOTE — ED Notes (Signed)
Assisted patient to ambulate to the commode and back to the stretcher; pt displays a steady, even, and balanced gait.

## 2017-11-01 NOTE — ED Provider Notes (Signed)
[facility] Emergency Department Provider Note   First MD Initiated Contact with Patient 11/01/17 0020     (approximate)  I have reviewed the triage vital signs and the nursing notes.   HISTORY  Chief Complaint Abdominal Pain    HPI Wayne Scott is a 66 y.o. male presents to the emergency department 1 week history of left flank/lower quadrant abdominal pain accompanied by nausea.  Patient states fluctuation in pain ranging from 10 out of 10 to aching.  However tonight patient states his pain dramatically worsened with current pain score being 10 out of 10.  Patient denies any dysuria or hematuria.  Patient denies any fever afebrile on presentation.   Past Medical History:  Diagnosis Date  . Allergic rhinitis   . Arthritis   . GERD (gastroesophageal reflux disease)   . Headache   . Heart murmur   . Hypertension   . Macular degeneration   . OSA (obstructive sleep apnea)   . Psoriasis     Patient Active Problem List   Diagnosis Date Noted  . Obesity (BMI 30-39.9) 10/23/2017  . Constipation 07/02/2017  . URI (upper respiratory infection) 07/02/2017  . Sleeping difficulty 07/02/2017  . Influenza 01/11/2016  . Obstructive sleep apnea 11/14/2015  . Psoriasis 11/14/2015  . Essential hypertension 09/19/2015  . Dyspnea 09/19/2015  . Lightheadedness 09/19/2015  . Psoriatic arthritis ([place]) 09/19/2015  . GERD (gastroesophageal reflux disease) 09/19/2015    Past Surgical History:  Procedure Laterality Date  . CATARACT EXTRACTION    . NASAL SINUS SURGERY    . TONSILLECTOMY    . VASECTOMY      Prior to Admission medications   Medication Sig Start Date End Date Taking? Authorizing Provider  albuterol (PROVENTIL HFA;VENTOLIN HFA) 108 (90 Base) MCG/ACT inhaler Inhale 2 puffs into the lungs every 6 (six) hours as needed for wheezing or shortness of breath. 01/11/16   Leone Haven, MD  amLODipine (NORVASC) 10 MG tablet TAKE 1 TABLET (10 MG  TOTAL) BY MOUTH DAILY. 08/12/17   Leone Haven, MD  B Complex-C (SUPER B COMPLEX PO) Take by mouth.    [provider]  cholecalciferol (VITAMIN D) 1000 units tablet Take 1,000 Units by mouth daily.    [provider]  fluticasone (FLONASE) 50 MCG/ACT nasal spray Place 2 sprays into both nostrils daily. 09/05/15   Versie Starks, PA  folic acid (FOLVITE) 1 MG tablet Take by mouth. 02/18/17 02/18/18  [provider]  methotrexate (RHEUMATREX) 2.5 MG tablet Take 2.5 mg by mouth. Take 8 tablets a week 06/16/17   [provider]  mometasone (ELOCON) 0.1 % cream APPLY TO AFFECTED AREA EVERY DAY 11/14/15   Leone Haven, MD  Multiple Vitamin (MULTIVITAMIN) capsule Take 1 capsule by mouth daily.    [provider]  naproxen sodium (ANAPROX) 220 MG tablet Take 220 mg by mouth as needed.    [provider]  omeprazole (PRILOSEC) 20 MG capsule TAKE 1 CAPSULE (20 MG TOTAL) BY MOUTH DAILY. 08/12/17   Leone Haven, MD  polyethylene glycol powder (GLYCOLAX/MIRALAX) powder Take 17 g by mouth daily as needed for mild constipation. 07/02/17   Leone Haven, MD  Probiotic Product (PROBIOTIC ADVANCED) CAPS Take by mouth.    [provider]  traZODone (DESYREL) 50 MG tablet Take 0.5-1 tablets (25-50 mg total) by mouth at bedtime as needed for sleep. 10/23/17   Leone Haven, MD    Allergies No known  drug allergies  Family History  Problem Relation Age of Onset  . Arthritis Unknown        Parent  . Hypertension Unknown        Parent  . Diabetes Unknown        Parent  . Bladder Cancer Neg Hx   . Prostate cancer Neg Hx   . Kidney cancer Neg Hx     Social History Social History   Tobacco Use  . Smoking status: Never Smoker  . Smokeless tobacco: Never Used  Substance Use Topics  . Alcohol use: Yes    Alcohol/week: 0.0 oz  . Drug use: No    Review of Systems Constitutional: No fever/chills Eyes: No visual  changes. ENT: No sore throat. Cardiovascular: Denies chest pain. Respiratory: Denies shortness of breath. Gastrointestinal: Positive for left lower quadrant/flank pain and nausea no diarrhea.  No constipation. Genitourinary: Negative for dysuria. Musculoskeletal: Negative for neck pain.  Negative for back pain. Integumentary: Negative for rash. Neurological: Negative for headaches, focal weakness or numbness.   ____________________________________________   PHYSICAL EXAM:  VITAL SIGNS: ED Triage Vitals  Enc Vitals Group     BP 10/31/17 2302 135/76     Pulse Rate 10/31/17 2302 75     Resp 10/31/17 2302 (!) 22     Temp 10/31/17 2302 97.8 F (36.6 C)     Temp Source 10/31/17 2302 Oral     SpO2 10/31/17 2302 100 %     Weight 10/31/17 2304 111.1 kg (245 lb)     Height 10/31/17 2304 1.829 m (6')     Head Circumference --      Peak Flow --      Pain Score 10/31/17 2302 10     Pain Loc --      Pain Edu? --      Excl. in [place]? --     Constitutional: Alert and oriented.  Apparent discomfort Eyes: Conjunctivae are normal. Head: Atraumatic. Mouth/Throat: Mucous membranes are moist. Neck: No stridor.   Cardiovascular: Normal rate, regular rhythm. Good peripheral circulation. Grossly normal heart sounds. Respiratory: Normal respiratory effort.  No retractions. Lungs CTAB. Gastrointestinal: Soft and nontender. No distention.  Musculoskeletal: No lower extremity tenderness nor edema. No gross deformities of extremities. Neurologic:  Normal speech and language. No gross focal neurologic deficits are appreciated.  Skin:  Skin is warm, dry and intact. No rash noted. Psychiatric: Mood and affect are normal. Speech and behavior are normal.  ____________________________________________   LABS (all labs ordered are listed, but only abnormal results are displayed)  Labs Reviewed  COMPREHENSIVE METABOLIC PANEL - Abnormal; Notable for the following components:      Result Value    Glucose, Bld 129 (*)    All other components within normal limits  CBC - Abnormal; Notable for the following components:   WBC 13.0 (*)    All other components within normal limits  URINALYSIS, COMPLETE (UACMP) WITH MICROSCOPIC - Abnormal; Notable for the following components:   Color, Urine YELLOW (*)    APPearance HAZY (*)    All other components within normal limits  LIPASE, BLOOD     RADIOLOGY I, [place]elton N BROWN, personally viewed and evaluated these images (plain radiographs) as part of my medical decision making, as well as reviewing the written report by the radiologist.  Ct Abdomen Pelvis W Contrast  Result Date: 11/01/2017 CLINICAL DATA:  66 year old male with abdominal pain. Concern for diverticulitis. EXAM: CT ABDOMEN AND PELVIS WITH CONTRAST TECHNIQUE: Multidetector  CT imaging of the abdomen and pelvis was performed using the standard protocol following bolus administration of intravenous contrast. CONTRAST:  134mL ISOVUE-300 IOPAMIDOL (ISOVUE-300) INJECTION 61% COMPARISON:  None. FINDINGS: Lower chest: The visualized lung bases are clear. No intra-abdominal free air or free fluid. Hepatobiliary: Probable mild fatty infiltration of the liver. No intrahepatic biliary ductal dilatation. The gallbladder is unremarkable. Pancreas: Unremarkable. No pancreatic ductal dilatation or surrounding inflammatory changes. Spleen: Normal in size without focal abnormality. Adrenals/Urinary Tract: The adrenal glands are unremarkable. There is a 6 mm stone in the proximal left ureter with mild left hydronephrosis. There is a 6.8 cm left renal upper pole cyst. The right kidney is unremarkable. Mild trabeculated appearance of the bladder wall. Stomach/Bowel: There is sigmoid diverticulosis without active inflammatory changes. There is no evidence of bowel obstruction or active inflammation. The appendix is not visualized with certainty. No inflammatory changes identified in the right lower quadrant.  Vascular/Lymphatic: The abdominal aorta and IVC appear unremarkable. No portal venous gas. There is no adenopathy. Reproductive: The prostate and seminal vesicles are grossly unremarkable. Other: Small fat containing umbilical hernia for Musculoskeletal: Degenerative changes of the spine. No acute osseous pathology. IMPRESSION: 1. A 6 mm stone in the proximal left ureter with mild left hydronephrosis. 2. Sigmoid diverticulosis. No bowel obstruction or active inflammation. Electronically Signed   By: Anner Crete M.D.   On: 11/01/2017 02:14    ____________ Procedures   ____________________________________________   INITIAL IMPRESSION / ASSESSMENT AND PLAN / ED COURSE  As part of my medical decision making, I reviewed the following data within the electronic MEDICAL RECORD [id number] year old male presenting with above-stated history and physical exam consistent with possible ureterolithiasis versus diverticulitis.  As such CT scan of the abdomen was performed which confirmed the evidence of a 6 mm proximal left ureter stone.  Patient given IV morphine and Zofran in the emergency department with improvement of pain current pain score 2 out of 10.  Patient be referred to Dr. Erlene Quan ____________________________________________  FINAL CLINICAL IMPRESSION(S) / ED DIAGNOSES  Final diagnoses:  Kidney stone     MEDICATIONS GIVEN DURING THIS VISIT:  Medications  sodium chloride 0.9 % bolus 1,000 mL (0 mLs Intravenous Stopped 11/01/17 0232)  morphine 4 MG/ML injection 4 mg (4 mg Intravenous Given 11/01/17 0053)  ondansetron (ZOFRAN) injection 4 mg (4 mg Intravenous Given 11/01/17 0053)  iopamidol (ISOVUE-300) 61 % injection 100 mL (100 mLs Intravenous Contrast Given 11/01/17 0137)  morphine 4 MG/ML injection 4 mg (4 mg Intravenous Given 11/01/17 0159)  oxyCODONE-acetaminophen (PERCOCET/ROXICET) 5-325 MG per tablet 2 tablet (2 tablets Oral Given 11/01/17 0626)     ED Discharge Orders    None        Note:  This document was prepared using Dragon voice recognition software and may include unintentional dictation errors.    Gregor Hams, MD 11/01/17 [phone]

## 2017-11-01 NOTE — ED Notes (Signed)

## 2017-11-05 ENCOUNTER — Ambulatory Visit
Admission: RE | Admit: 2017-11-05 | Discharge: 2017-11-05 | Disposition: A | Discharge status: Home / Self Care | Payer: Medicare Other | Source: Ambulatory Visit | Attending: Urology | Admitting: Urology

## 2017-11-05 ENCOUNTER — Encounter: Payer: Self-pay | Admitting: Urology

## 2017-11-05 ENCOUNTER — Ambulatory Visit: Payer: Medicare Other | Admitting: Urology

## 2017-11-05 ENCOUNTER — Ambulatory Visit (INDEPENDENT_AMBULATORY_CARE_PROVIDER_SITE_OTHER): Payer: Medicare Other | Admitting: Urology

## 2017-11-05 VITALS — BP 152/90 | HR 91 | Ht 72.0 in | Wt 254.6 lb

## 2017-11-05 DIAGNOSIS — N200 Calculus of kidney: Secondary | ICD-10-CM

## 2017-11-05 DIAGNOSIS — N201 Calculus of ureter: Secondary | ICD-10-CM | POA: Insufficient documentation

## 2017-11-05 LAB — URINALYSIS, COMPLETE
Bilirubin, UA: NEGATIVE
Glucose, UA: NEGATIVE
Ketones, UA: NEGATIVE
Nitrite, UA: NEGATIVE
Protein, UA: NEGATIVE
RBC, UA: NEGATIVE
Specific Gravity, UA: 1.015 (ref 1.005–1.030)
Urobilinogen, Ur: 0.2 mg/dL (ref 0.2–1.0)
pH, UA: 7 (ref 5.0–7.5)

## 2017-11-05 LAB — MICROSCOPIC EXAMINATION

## 2017-11-05 IMAGING — CR DG ABDOMEN 1V
1 series · 2 of 2 positions shown · non-contrast
Comparison: CT [DATE].

CLINICAL DATA: Left-sided kidney stone.

EXAM:
ABDOMEN - 1 VIEW

[Series 1: t abdomen supine · 0.14mm/px · 2 of 2 slices shown]
[im 1/2]
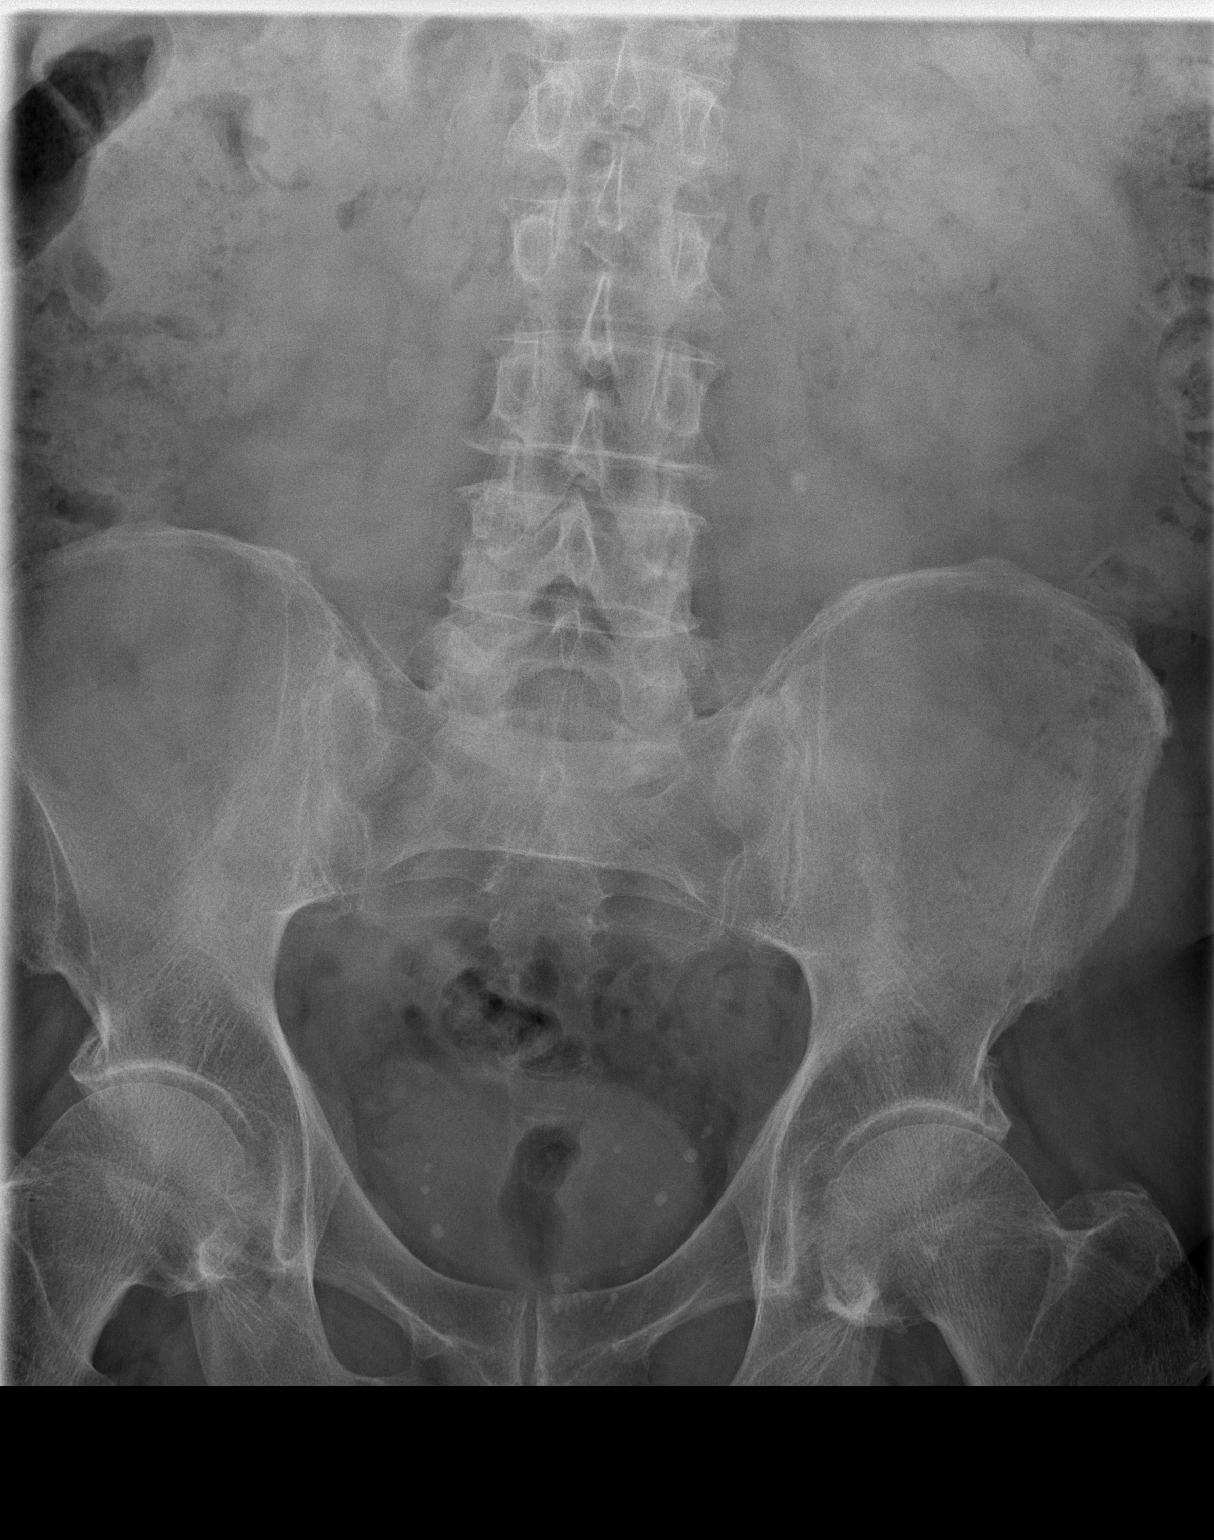
[im 2/2]
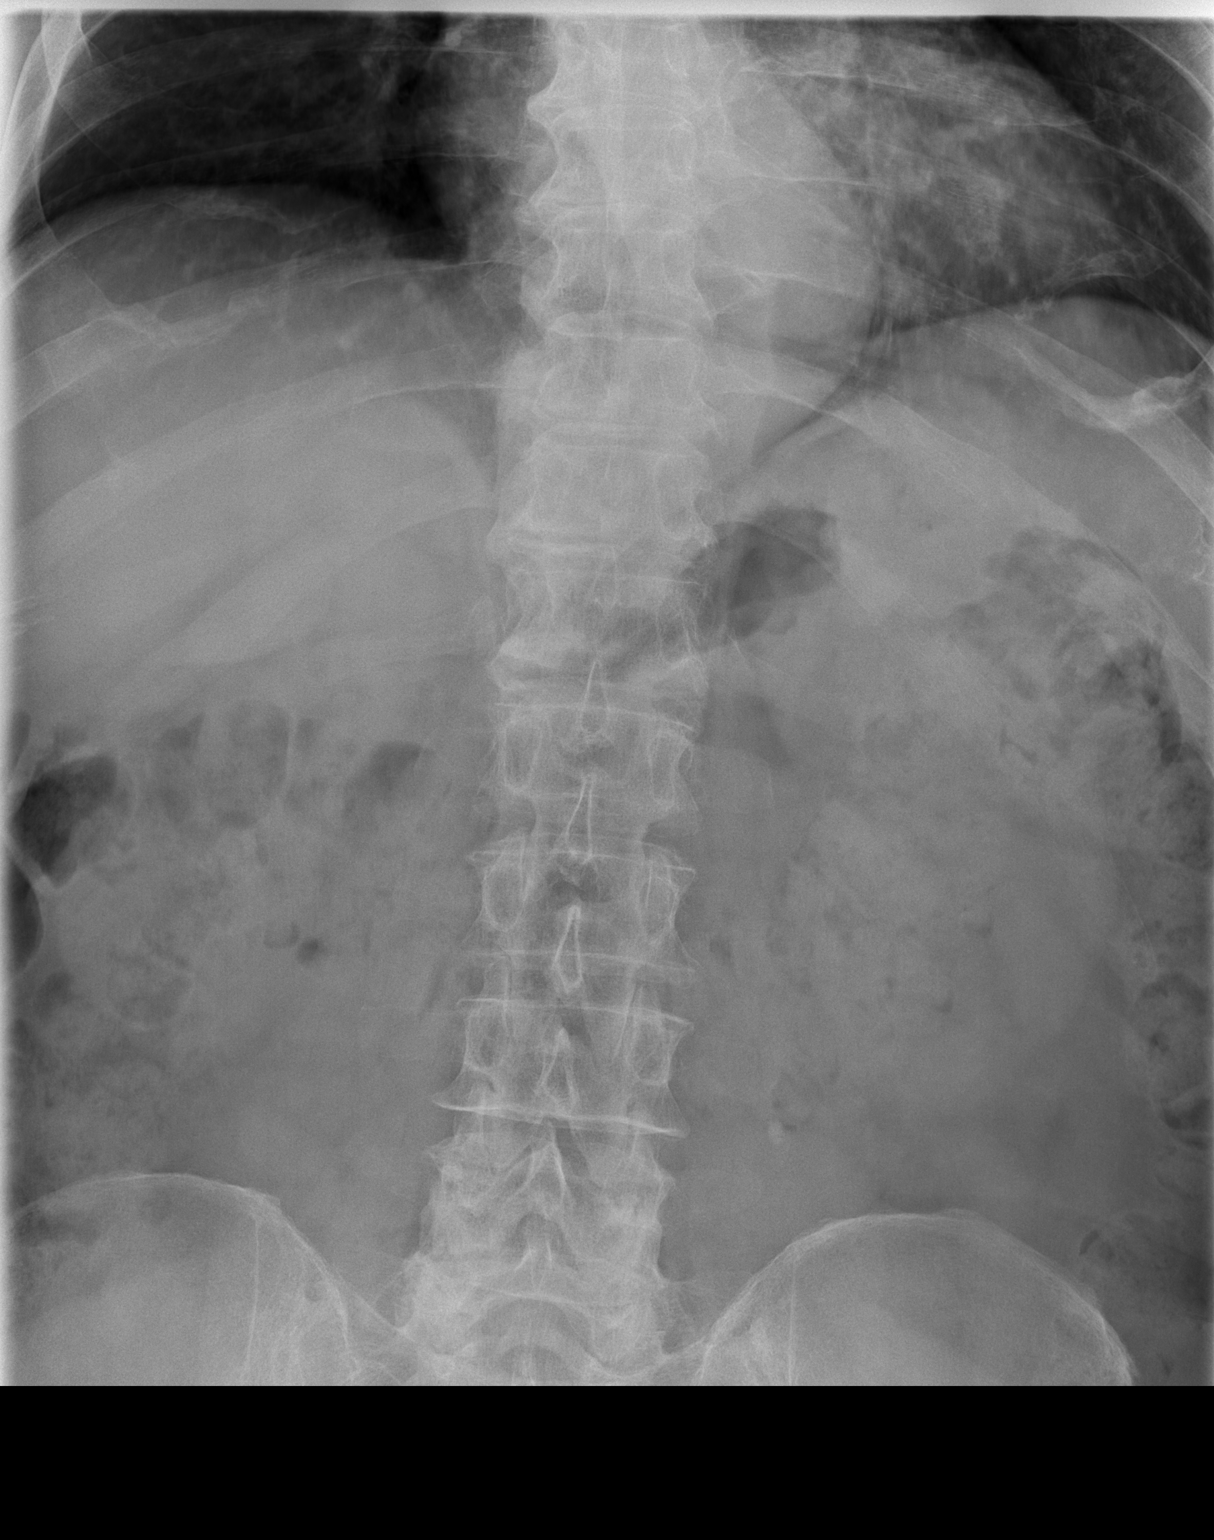

[2 of 2 positions shown; findings below may reference images not displayed]

FINDINGS: 7 mm stone again noted projected over the left upper ureter in
unchanged position. Calcific densities again noted the pelvis again
consistent phleboliths. These appears similar. No bowel distention.
Stool noted throughout colon. Degenerative changes lumbar spine .
IMPRESSION: 7 mm stone again noted projected over the left upper ureter in
unchanged position.

## 2017-11-05 NOTE — Progress Notes (Signed)
11/05/2017 12:40 PM   Wayne Scott 01/06/1951 [id number]  Referring provider: Leone Haven, MD [address] [place], [place] [address]  Chief Complaint  Patient presents with  . Nephrolithiasis    HPI: Wayne Scott is a 66 y.o. male who presents for evaluation of a recently diagnosed left ureteral calculus.  He was seen in the [facility] ED on 11/01/2017 with a 6-day history of left lower quadrant abdominal pain.  He noted radiation of his pain to the left flank.  At the time of his ED visit the severity was rated 10/10 without identifiable precipitating, aggravating or alleviating factors.  He did have nausea/vomiting but denied fever/chills.  A CT of the abdomen and pelvis with contrast was performed which showed a 6 mm left proximal ureteral calculus with mild to moderate hydronephrosis/hydroureter.  He received parenteral analgesics with resolution of his pain.  He was discharged on tamsulosin and hydrocodone.  He has not required any additional pain medication since his ED visit. He denies previous history of stone disease but has been followed in our practice for erectile dysfunction and is on intracavernosal injections.  PMH: Past Medical History:  Diagnosis Date  . Allergic rhinitis   . Arthritis   . GERD (gastroesophageal reflux disease)   . Headache   . Heart murmur   . Hypertension   . Macular degeneration   . OSA (obstructive sleep apnea)   . Psoriasis     Surgical History: Past Surgical History:  Procedure Laterality Date  . CATARACT EXTRACTION    . NASAL SINUS SURGERY    . TONSILLECTOMY    . VASECTOMY      Home Medications:  Allergies as of 11/05/2017   No Known Allergies     Medication List        Accurate as of 11/05/17 12:40 PM. Always use your most recent med list.          amLODipine 10 MG tablet Commonly known as:  NORVASC TAKE 1 TABLET (10 MG TOTAL) BY MOUTH DAILY.   cholecalciferol 1000 units  tablet Commonly known as:  VITAMIN D Take 1,000 Units by mouth daily.   fluticasone 50 MCG/ACT nasal spray Commonly known as:  FLONASE Place 2 sprays into both nostrils daily.   folic acid 1 MG tablet Commonly known as:  FOLVITE Take by mouth.   methotrexate 2.5 MG tablet Commonly known as:  RHEUMATREX Take 2.5 mg by mouth. Take 8 tablets a week   mometasone 0.1 % cream Commonly known as:  ELOCON APPLY TO AFFECTED AREA EVERY DAY   multivitamin capsule Take 1 capsule by mouth daily.   naproxen sodium 220 MG tablet Commonly known as:  ALEVE Take 220 mg by mouth as needed.   omeprazole 20 MG capsule Commonly known as:  PRILOSEC TAKE 1 CAPSULE (20 MG TOTAL) BY MOUTH DAILY.   ondansetron 4 MG disintegrating tablet Commonly known as:  ZOFRAN ODT Take 1 tablet (4 mg total) by mouth every 8 (eight) hours as needed for nausea or vomiting.   oxyCODONE-acetaminophen 5-325 MG tablet Commonly known as:  ROXICET Take 1 tablet by mouth every 6 (six) hours as needed.   polyethylene glycol powder powder Commonly known as:  GLYCOLAX/MIRALAX Take 17 g by mouth daily as needed for mild constipation.   PROBIOTIC ADVANCED Caps Take by mouth.   SUPER B COMPLEX PO Take by mouth.   tamsulosin 0.4 MG Caps capsule Commonly known as:  FLOMAX Take 1 capsule (  0.4 mg total) by mouth daily after breakfast.   traZODone 50 MG tablet Commonly known as:  DESYREL Take 0.5-1 tablets (25-50 mg total) by mouth at bedtime as needed for sleep.       Allergies: No Known Allergies  Family History: Family History  Problem Relation Age of Onset  . Arthritis Unknown        Parent  . Hypertension Unknown        Parent  . Diabetes Unknown        Parent  . Bladder Cancer Neg Hx   . Prostate cancer Neg Hx   . Kidney cancer Neg Hx     Social History:  reports that  has never smoked. he has never used smokeless tobacco. He reports that he drinks alcohol. He reports that he does not use  drugs.  ROS: UROLOGY Frequent Urination?: No Hard to postpone urination?: No Burning/pain with urination?: No Get up at night to urinate?: No Leakage of urine?: No Urine stream starts and stops?: No Trouble starting stream?: No Do you have to strain to urinate?: No Blood in urine?: No Urinary tract infection?: No Sexually transmitted disease?: No Injury to kidneys or bladder?: No Painful intercourse?: No Weak stream?: No Erection problems?: No Penile pain?: No  Gastrointestinal Nausea?: No Vomiting?: No Indigestion/heartburn?: No Diarrhea?: No Constipation?: No  Constitutional Fever: No Night sweats?: No Weight loss?: No Fatigue?: No  Skin Skin rash/lesions?: No Itching?: No  Eyes Blurred vision?: No Double vision?: No  Ears/Nose/Throat Sore throat?: No Sinus problems?: No  Hematologic/Lymphatic Swollen glands?: No Easy bruising?: No  Cardiovascular Leg swelling?: No Chest pain?: No  Respiratory Cough?: No Shortness of breath?: No  Endocrine Excessive thirst?: No  Musculoskeletal Back pain?: No Joint pain?: No  Neurological Headaches?: No Dizziness?: No  Psychologic Depression?: No Anxiety?: No  Physical Exam: BP (!) 152/90 (BP Location: Right Arm, Patient Position: Sitting, Cuff Size: Large)   Pulse 91   Ht 6' (1.829 m)   Wt 254 lb 9.6 oz (115.5 kg)   BMI 34.53 kg/m   Constitutional:  Alert and oriented, No acute distress. HEENT: [place] AT, moist mucus membranes.  Trachea midline, no masses. Cardiovascular: No clubbing, cyanosis, or edema. Respiratory: Normal respiratory effort, no increased work of breathing. GI: Abdomen is soft, nontender, nondistended, no abdominal masses GU: No CVA tenderness.  Skin: No rashes, bruises or suspicious lesions. Lymph: No cervical or inguinal adenopathy. Neurologic: Grossly intact, no focal deficits, moving all 4 extremities. Psychiatric: Normal mood and affect.  Laboratory Data: Lab Results   Component Value Date   WBC 13.0 (H) 10/31/2017   HGB 16.5 10/31/2017   HCT 48.8 10/31/2017   MCV 92.9 10/31/2017   PLT 212 10/31/2017    Lab Results  Component Value Date   CREATININE 1.10 10/31/2017    Lab Results  Component Value Date   PSA1 0.2 01/25/2017   PSA1 0.2 09/21/2015    Lab Results  Component Value Date   HGBA1C 6.0 10/23/2017    Urinalysis Lab Results  Component Value Date   APPEARANCEUR HAZY (A) 10/31/2017   LEUKOCYTESUR NEGATIVE 10/31/2017   PROTEINUR NEGATIVE 10/31/2017   GLUCOSEU NEGATIVE 10/31/2017   RBCU 6-30 10/31/2017   BILIRUBINUR NEGATIVE 10/31/2017   NITRITE NEGATIVE 10/31/2017    Lab Results  Component Value Date   BACTERIA NONE SEEN 10/31/2017    Pertinent Imaging: CT images were personally reviewed and the calculus measures 4 x 6 mm.   Assessment & Plan:  1. Ureteral calculus, left Management options were discussed including a brief trial of passage, shockwave lithotripsy and ureteroscopic removal.  A KUB was ordered to see if the stone can be visualized on plain radiography.  He would like to think over these options.  - Urinalysis, Complete - Abdomen 1 view (KUB); Future   Abbie Sons, [place] [address] [place], [place] [address] [phone]

## 2017-11-05 NOTE — H&P (View-Only) (Signed)
11/05/2017 12:40 PM   Wayne Scott Apr 02, 1951 [id number]  Referring provider: Leone Haven, MD [address] [place], [place] [address]  Chief Complaint  Patient presents with  . Nephrolithiasis    HPI: Wayne Scott is a 66 y.o. male who presents for evaluation of a recently diagnosed left ureteral calculus.  He was seen in the [facility] ED on 11/01/2017 with a 6-day history of left lower quadrant abdominal pain.  He noted radiation of his pain to the left flank.  At the time of his ED visit the severity was rated 10/10 without identifiable precipitating, aggravating or alleviating factors.  He did have nausea/vomiting but denied fever/chills.  A CT of the abdomen and pelvis with contrast was performed which showed a 6 mm left proximal ureteral calculus with mild to moderate hydronephrosis/hydroureter.  He received parenteral analgesics with resolution of his pain.  He was discharged on tamsulosin and hydrocodone.  He has not required any additional pain medication since his ED visit. He denies previous history of stone disease but has been followed in our practice for erectile dysfunction and is on intracavernosal injections.  PMH: Past Medical History:  Diagnosis Date  . Allergic rhinitis   . Arthritis   . GERD (gastroesophageal reflux disease)   . Headache   . Heart murmur   . Hypertension   . Macular degeneration   . OSA (obstructive sleep apnea)   . Psoriasis     Surgical History: Past Surgical History:  Procedure Laterality Date  . CATARACT EXTRACTION    . NASAL SINUS SURGERY    . TONSILLECTOMY    . VASECTOMY      Home Medications:  Allergies as of 11/05/2017   No Known Allergies     Medication List        Accurate as of 11/05/17 12:40 PM. Always use your most recent med list.          amLODipine 10 MG tablet Commonly known as:  NORVASC TAKE 1 TABLET (10 MG TOTAL) BY MOUTH DAILY.   cholecalciferol 1000 units  tablet Commonly known as:  VITAMIN D Take 1,000 Units by mouth daily.   fluticasone 50 MCG/ACT nasal spray Commonly known as:  FLONASE Place 2 sprays into both nostrils daily.   folic acid 1 MG tablet Commonly known as:  FOLVITE Take by mouth.   methotrexate 2.5 MG tablet Commonly known as:  RHEUMATREX Take 2.5 mg by mouth. Take 8 tablets a week   mometasone 0.1 % cream Commonly known as:  ELOCON APPLY TO AFFECTED AREA EVERY DAY   multivitamin capsule Take 1 capsule by mouth daily.   naproxen sodium 220 MG tablet Commonly known as:  ALEVE Take 220 mg by mouth as needed.   omeprazole 20 MG capsule Commonly known as:  PRILOSEC TAKE 1 CAPSULE (20 MG TOTAL) BY MOUTH DAILY.   ondansetron 4 MG disintegrating tablet Commonly known as:  ZOFRAN ODT Take 1 tablet (4 mg total) by mouth every 8 (eight) hours as needed for nausea or vomiting.   oxyCODONE-acetaminophen 5-325 MG tablet Commonly known as:  ROXICET Take 1 tablet by mouth every 6 (six) hours as needed.   polyethylene glycol powder powder Commonly known as:  GLYCOLAX/MIRALAX Take 17 g by mouth daily as needed for mild constipation.   PROBIOTIC ADVANCED Caps Take by mouth.   SUPER B COMPLEX PO Take by mouth.   tamsulosin 0.4 MG Caps capsule Commonly known as:  FLOMAX Take 1 capsule (  0.4 mg total) by mouth daily after breakfast.   traZODone 50 MG tablet Commonly known as:  DESYREL Take 0.5-1 tablets (25-50 mg total) by mouth at bedtime as needed for sleep.       Allergies: No Known Allergies  Family History: Family History  Problem Relation Age of Onset  . Arthritis Unknown        Parent  . Hypertension Unknown        Parent  . Diabetes Unknown        Parent  . Bladder Cancer Neg Hx   . Prostate cancer Neg Hx   . Kidney cancer Neg Hx     Social History:  reports that  has never smoked. he has never used smokeless tobacco. He reports that he drinks alcohol. He reports that he does not use  drugs.  ROS: UROLOGY Frequent Urination?: No Hard to postpone urination?: No Burning/pain with urination?: No Get up at night to urinate?: No Leakage of urine?: No Urine stream starts and stops?: No Trouble starting stream?: No Do you have to strain to urinate?: No Blood in urine?: No Urinary tract infection?: No Sexually transmitted disease?: No Injury to kidneys or bladder?: No Painful intercourse?: No Weak stream?: No Erection problems?: No Penile pain?: No  Gastrointestinal Nausea?: No Vomiting?: No Indigestion/heartburn?: No Diarrhea?: No Constipation?: No  Constitutional Fever: No Night sweats?: No Weight loss?: No Fatigue?: No  Skin Skin rash/lesions?: No Itching?: No  Eyes Blurred vision?: No Double vision?: No  Ears/Nose/Throat Sore throat?: No Sinus problems?: No  Hematologic/Lymphatic Swollen glands?: No Easy bruising?: No  Cardiovascular Leg swelling?: No Chest pain?: No  Respiratory Cough?: No Shortness of breath?: No  Endocrine Excessive thirst?: No  Musculoskeletal Back pain?: No Joint pain?: No  Neurological Headaches?: No Dizziness?: No  Psychologic Depression?: No Anxiety?: No  Physical Exam: BP (!) 152/90 (BP Location: Right Arm, Patient Position: Sitting, Cuff Size: Large)   Pulse 91   Ht 6' (1.829 m)   Wt 254 lb 9.6 oz (115.5 kg)   BMI 34.53 kg/m   Constitutional:  Alert and oriented, No acute distress. HEENT: [place] AT, moist mucus membranes.  Trachea midline, no masses. Cardiovascular: No clubbing, cyanosis, or edema. Respiratory: Normal respiratory effort, no increased work of breathing. GI: Abdomen is soft, nontender, nondistended, no abdominal masses GU: No CVA tenderness.  Skin: No rashes, bruises or suspicious lesions. Lymph: No cervical or inguinal adenopathy. Neurologic: Grossly intact, no focal deficits, moving all 4 extremities. Psychiatric: Normal mood and affect.  Laboratory Data: Lab Results   Component Value Date   WBC 13.0 (H) 10/31/2017   HGB 16.5 10/31/2017   HCT 48.8 10/31/2017   MCV 92.9 10/31/2017   PLT 212 10/31/2017    Lab Results  Component Value Date   CREATININE 1.10 10/31/2017    Lab Results  Component Value Date   PSA1 0.2 01/25/2017   PSA1 0.2 09/21/2015    Lab Results  Component Value Date   HGBA1C 6.0 10/23/2017    Urinalysis Lab Results  Component Value Date   APPEARANCEUR HAZY (A) 10/31/2017   LEUKOCYTESUR NEGATIVE 10/31/2017   PROTEINUR NEGATIVE 10/31/2017   GLUCOSEU NEGATIVE 10/31/2017   RBCU 6-30 10/31/2017   BILIRUBINUR NEGATIVE 10/31/2017   NITRITE NEGATIVE 10/31/2017    Lab Results  Component Value Date   BACTERIA NONE SEEN 10/31/2017    Pertinent Imaging: CT images were personally reviewed and the calculus measures 4 x 6 mm.   Assessment & Plan:  1. Ureteral calculus, left Management options were discussed including a brief trial of passage, shockwave lithotripsy and ureteroscopic removal.  A KUB was ordered to see if the stone can be visualized on plain radiography.  He would like to think over these options.  - Urinalysis, Complete - Abdomen 1 view (KUB); Future   Abbie Sons, [place] [address], [place], [place] [address] [phone]

## 2017-11-06 ENCOUNTER — Other Ambulatory Visit: Payer: Self-pay | Admitting: *Deleted

## 2017-11-06 ENCOUNTER — Telehealth: Payer: Self-pay

## 2017-11-06 ENCOUNTER — Encounter: Payer: Self-pay | Admitting: *Deleted

## 2017-11-06 NOTE — Patient Outreach (Signed)
[facility]) Care Management  11/06/2017  Macario Shear Yazdani 1951/06/30 [id number]   RN Health Coach attempted #1 screening outreach call to patient.  Patient was unavailable. HIPPA compliance voicemail message left with return callback number.  Plan: RN will call patient again within 5 days.  [facility] [phone]

## 2017-11-06 NOTE — Telephone Encounter (Signed)
-----   Message from Abbie Sons, MD sent at 11/06/2017  7:54 AM EST ----- Wayne Scott is still present in the upper ureter making it less likely that it will pass.  If he desires to continue a brief trial of passage would recommend a follow-up KUB in 1 week.  We also discussed shockwave lithotripsy and ureteroscopy.

## 2017-11-06 NOTE — Patient Outreach (Signed)
[facility]) Care Management  11/06/2017  CAPTAIN BLUCHER 08/12/1951 [id number]   Referral Date:11/05/2017 Referral Source:[facility] ED Census Referral Reason:High ED utilization [place] telephone call to patient.  Hip[employer] compliance verified. RN Health Coach described services available, Registered Nurse, Magazine features editor. Patient stated He does not need any of the services at this time but is agreeable to brochure and magnet being sent.  Plan: Send pamphlet [facility] Know when to go sheet  [place] Management [phone]

## 2017-11-06 NOTE — Telephone Encounter (Signed)
Spoke with pt in reference to stone still being present. Pt stated that he would like to proceed with ESWL. Made pt aware information will be sent to Amy and she will be in touch accordingly. Pt voiced understanding.

## 2017-11-07 ENCOUNTER — Other Ambulatory Visit: Payer: Self-pay | Admitting: Family Medicine

## 2017-11-08 ENCOUNTER — Other Ambulatory Visit: Payer: Self-pay | Admitting: Radiology

## 2017-11-08 ENCOUNTER — Encounter: Payer: Self-pay | Admitting: Family Medicine

## 2017-11-08 DIAGNOSIS — H40003 Preglaucoma, unspecified, bilateral: Secondary | ICD-10-CM | POA: Diagnosis not present

## 2017-11-08 DIAGNOSIS — N201 Calculus of ureter: Secondary | ICD-10-CM

## 2017-11-14 ENCOUNTER — Encounter
Admission: RE | Disposition: A | Discharge status: Home / Self Care | Payer: Self-pay | Source: Ambulatory Visit | Attending: Urology

## 2017-11-14 ENCOUNTER — Encounter: Payer: Self-pay | Admitting: *Deleted

## 2017-11-14 ENCOUNTER — Ambulatory Visit: Payer: Medicare Other

## 2017-11-14 ENCOUNTER — Other Ambulatory Visit: Payer: Self-pay

## 2017-11-14 ENCOUNTER — Ambulatory Visit
Admission: RE | Admit: 2017-11-14 | Discharge: 2017-11-14 | Disposition: A | Discharge status: Home / Self Care | Payer: Medicare Other | Source: Ambulatory Visit | Attending: Urology | Admitting: Urology

## 2017-11-14 DIAGNOSIS — K219 Gastro-esophageal reflux disease without esophagitis: Secondary | ICD-10-CM | POA: Diagnosis not present

## 2017-11-14 DIAGNOSIS — G4733 Obstructive sleep apnea (adult) (pediatric): Secondary | ICD-10-CM | POA: Insufficient documentation

## 2017-11-14 DIAGNOSIS — H3530 Unspecified macular degeneration: Secondary | ICD-10-CM | POA: Insufficient documentation

## 2017-11-14 DIAGNOSIS — N201 Calculus of ureter: Secondary | ICD-10-CM

## 2017-11-14 DIAGNOSIS — N529 Male erectile dysfunction, unspecified: Secondary | ICD-10-CM | POA: Diagnosis not present

## 2017-11-14 DIAGNOSIS — I10 Essential (primary) hypertension: Secondary | ICD-10-CM | POA: Insufficient documentation

## 2017-11-14 DIAGNOSIS — L409 Psoriasis, unspecified: Secondary | ICD-10-CM | POA: Insufficient documentation

## 2017-11-14 DIAGNOSIS — Z79899 Other long term (current) drug therapy: Secondary | ICD-10-CM | POA: Insufficient documentation

## 2017-11-14 DIAGNOSIS — N200 Calculus of kidney: Secondary | ICD-10-CM | POA: Diagnosis not present

## 2017-11-14 HISTORY — PX: EXTRACORPOREAL SHOCK WAVE LITHOTRIPSY: SHX1557

## 2017-11-14 SURGERY — LITHOTRIPSY, ESWL
Anesthesia: Moderate Sedation | Laterality: Left

## 2017-11-14 MED ORDER — ONDANSETRON HCL 4 MG/2ML IJ SOLN
INTRAMUSCULAR | Status: AC
  Administered 2017-11-14: 4 mg via INTRAVENOUS
  Filled 2017-11-14: qty 2

## 2017-11-14 MED ORDER — DIPHENHYDRAMINE HCL 25 MG PO CAPS
25.0000 mg | ORAL_CAPSULE | ORAL | Status: AC
  Administered 2017-11-14: 25 mg via ORAL

## 2017-11-14 MED ORDER — DIPHENHYDRAMINE HCL 25 MG PO CAPS
ORAL_CAPSULE | ORAL | Status: AC
  Administered 2017-11-14: 25 mg via ORAL
  Filled 2017-11-14: qty 1

## 2017-11-14 MED ORDER — ONDANSETRON HCL 4 MG/2ML IJ SOLN
4.0000 mg | Freq: Once | INTRAMUSCULAR | Status: AC
  Administered 2017-11-14: 4 mg via INTRAVENOUS

## 2017-11-14 MED ORDER — DIAZEPAM 5 MG PO TABS
ORAL_TABLET | ORAL | Status: AC
  Administered 2017-11-14: 10 mg via ORAL
  Filled 2017-11-14: qty 2

## 2017-11-14 MED ORDER — CIPROFLOXACIN HCL 500 MG PO TABS
500.0000 mg | ORAL_TABLET | ORAL | Status: AC
  Administered 2017-11-14: 500 mg via ORAL

## 2017-11-14 MED ORDER — CIPROFLOXACIN HCL 500 MG PO TABS
ORAL_TABLET | ORAL | Status: AC
  Administered 2017-11-14: 500 mg via ORAL
  Filled 2017-11-14: qty 1

## 2017-11-14 MED ORDER — SODIUM CHLORIDE 0.9 % IV SOLN: INTRAVENOUS | Status: DC

## 2017-11-14 MED ORDER — OXYCODONE-ACETAMINOPHEN 5-325 MG PO TABS: 1.0000 | ORAL_TABLET | Freq: Four times a day (QID) | ORAL | 0 refills | Status: DC | PRN

## 2017-11-14 MED ORDER — DIAZEPAM 5 MG PO TABS
10.0000 mg | ORAL_TABLET | ORAL | Status: AC
  Administered 2017-11-14: 10 mg via ORAL

## 2017-11-14 NOTE — Discharge Instructions (Signed)
Dietary Guidelines to Help Prevent Kidney Stones Kidney stones are deposits of minerals and salts that form inside your kidneys. Your risk of developing kidney stones may be greater depending on your diet, your lifestyle, the medicines you take, and whether you have certain medical conditions. Most people can reduce their chances of developing kidney stones by following the instructions below. Depending on your overall health and the type of kidney stones you tend to develop, your dietitian may give you more specific instructions. What are tips for following this plan? Reading food labels  Choose foods with "no salt added" or "low-salt" labels. Limit your sodium intake to less than 1500 mg per day.  Choose foods with calcium for each meal and snack. Try to eat about 300 mg of calcium at each meal. Foods that contain 200-500 mg of calcium per serving include: ? 8 oz (237 ml) of milk, fortified nondairy milk, and fortified fruit juice. ? 8 oz (237 ml) of kefir, yogurt, and soy yogurt. ? 4 oz (118 ml) of tofu. ? 1 oz of cheese. ? 1 cup (300 g) of dried figs. ? 1 cup (91 g) of cooked broccoli. ? 1-3 oz can of sardines or mackerel.  Most people need 1000 to 1500 mg of calcium each day. Talk to your dietitian about how much calcium is recommended for you. Shopping  Buy plenty of fresh fruits and vegetables. Most people do not need to avoid fruits and vegetables, even if they contain nutrients that may contribute to kidney stones.  When shopping for convenience foods, choose: ? Whole pieces of fruit. ? Premade salads with dressing on the side. ? Low-fat fruit and yogurt smoothies.  Avoid buying frozen meals or prepared deli foods.  Look for foods with live cultures, such as yogurt and kefir. Cooking  Do not add salt to food when cooking. Place a salt shaker on the table and allow each person to add his or her own salt to taste.  Use vegetable protein, such as beans, textured vegetable  protein (TVP), or tofu instead of meat in pasta, casseroles, and soups. Meal planning  Eat less salt, if told by your dietitian. To do this: ? Avoid eating processed or premade food. ? Avoid eating fast food.  Eat less animal protein, including cheese, meat, poultry, or fish, if told by your dietitian. To do this: ? Limit the number of times you have meat, poultry, fish, or cheese each week. Eat a diet free of meat at least 2 days a week. ? Eat only one serving each day of meat, poultry, fish, or seafood. ? When you prepare animal protein, cut pieces into small portion sizes. For most meat and fish, one serving is about the size of one deck of cards.  Eat at least 5 servings of fresh fruits and vegetables each day. To do this: ? Keep fruits and vegetables on hand for snacks. ? Eat 1 piece of fruit or a handful of berries with breakfast. ? Have a salad and fruit at lunch. ? Have two kinds of vegetables at dinner.  Limit foods that are high in a substance called oxalate. These include: ? Spinach. ? Rhubarb. ? Beets. ? Potato chips and french fries. ? Nuts.  If you regularly take a diuretic medicine, make sure to eat at least 1-2 fruits or vegetables high in potassium each day. These include: ? Avocado. ? Banana. ? Orange, prune, carrot, or tomato juice. ? Baked potato. ? Cabbage. ? Beans and split  peas. General instructions  Drink enough fluid to keep your urine clear or pale yellow. This is the most important thing you can do.  Talk to your health care provider and dietitian about taking daily supplements. Depending on your health and the cause of your kidney stones, you may be advised: ? Not to take supplements with vitamin C. ? To take a calcium supplement. ? To take a daily probiotic supplement. ? To take other supplements such as magnesium, fish oil, or vitamin B6.  Take all medicines and supplements as told by your health care provider.  Limit alcohol intake to no more  than 1 drink a day for nonpregnant women and 2 drinks a day for men. One drink equals 12 oz of beer, 5 oz of wine, or 1 oz of hard liquor.  Lose weight if told by your health care provider. Work with your dietitian to find strategies and an eating plan that works best for you. What foods are not recommended? Limit your intake of the following foods, or as told by your dietitian. Talk to your dietitian about specific foods you should avoid based on the type of kidney stones and your overall health. Grains Breads. Bagels. Rolls. Baked goods. Salted crackers. Cereal. Pasta. Vegetables Spinach. Rhubarb. Beets. Canned vegetables. Wayne Scott. Olives. Meats and other protein foods Nuts. Nut butters. Large portions of meat, poultry, or fish. Salted or cured meats. Deli meats. Hot dogs. Sausages. Dairy Cheese. Beverages Regular soft drinks. Regular vegetable juice. Seasonings and other foods Seasoning blends with salt. Salad dressings. Canned soups. Soy sauce. Ketchup. Barbecue sauce. Canned pasta sauce. Casseroles. Pizza. Lasagna. Frozen meals. Potato chips. [place] fries. Summary  You can reduce your risk of kidney stones by making changes to your diet.  The most important thing you can do is drink enough fluid. You should drink enough fluid to keep your urine clear or pale yellow.  Ask your health care provider or dietitian how much protein from animal sources you should eat each day, and also how much salt and calcium you should have each day. This information is not intended to replace advice given to you by your health care provider. Make sure you discuss any questions you have with your health care provider. Document Released: 03/09/2011 Document Revised: 10/23/2016 Document Reviewed: 10/23/2016 Elsevier Interactive Patient Education  2018 [place]   1) The drugs that you were given will stay in your system until tomorrow so for the next 24  hours you should not:  A) Drive an automobile B) Make any legal decisions C) Drink any alcoholic beverage   2) You may resume regular meals tomorrow.  Today it is better to start with liquids and gradually work up to solid foods.  You may eat anything you prefer, but it is better to start with liquids, then soup and crackers, and gradually work up to solid foods.   3) Please notify your doctor immediately if you have any unusual bleeding, trouble breathing, redness and pain at the surgery site, drainage, fever, or pain not relieved by medication.    4) Additional Instructions:    Please contact your physician with any problems or Same Day Surgery at [phone], Monday through Friday 6 am to 4 pm, or [place] at [facility] number at [phone].

## 2017-11-14 NOTE — Interval H&P Note (Signed)
History and Physical Interval Note:  11/14/2017 8:02 AM  Wayne Scott  has presented today for surgery, with the diagnosis of Kidney stone  The various methods of treatment have been discussed with the patient and family. After consideration of risks, benefits and other options for treatment, the patient has consented to  Procedure(s): EXTRACORPOREAL SHOCK WAVE LITHOTRIPSY (ESWL) (Left) as a surgical intervention .  The patient's history has been reviewed, patient examined, no change in status, stable for surgery.  I have reviewed the patient's chart and labs.  Questions were answered to the patient's satisfaction.    RRR CTAB  Hollice Espy

## 2017-11-15 ENCOUNTER — Encounter: Payer: Self-pay | Admitting: Urology

## 2017-11-25 DIAGNOSIS — H353211 Exudative age-related macular degeneration, right eye, with active choroidal neovascularization: Secondary | ICD-10-CM | POA: Diagnosis not present

## 2017-12-01 NOTE — Progress Notes (Signed)
12/02/2017 10:09 AM   Wayne Scott 05/24/1951 [id number]  Referring provider: Leone Haven, MD [address] [place], [place] [address]  Chief Complaint  Patient presents with  . Follow-up    KUB results    HPI: 67 yo WM who is s/p left ESWL on 11/14/2017 who presents today for a 2 week follow up.  Background history Wayne Scott is a 67 y.o. male who presents for evaluation of a recently diagnosed left ureteral calculus.  He was seen in the [facility] ED on 11/01/2017 with a 6-day history of left lower quadrant abdominal pain.  He noted radiation of his pain to the left flank.  At the time of his ED visit the severity was rated 10/10 without identifiable precipitating, aggravating or alleviating factors.  He did have nausea/vomiting but denied fever/chills.  A CT of the abdomen and pelvis with contrast was performed which showed a 6 mm left proximal ureteral calculus with mild to moderate hydronephrosis/hydroureter.  He received parenteral analgesics with resolution of his pain.  He was discharged on tamsulosin and hydrocodone.  He has not required any additional pain medication since his ED visit. He denies previous history of stone disease but has been followed in our practice for erectile dysfunction and is on intracavernosal injections.  After ESWL, he experienced no untoward side effects  KUB taken today noted that Left ureteral stone appears to have passed.  Today, he is experiencing no flank pain gross hematuria or dysuria.  He has not had any recent fevers, chills, nausea or vomiting.  PMH: Past Medical History:  Diagnosis Date  . Allergic rhinitis   . Arthritis   . GERD (gastroesophageal reflux disease)   . Headache   . Heart murmur   . Hypertension   . Macular degeneration   . OSA (obstructive sleep apnea)    uses CPAP  . Psoriasis     Surgical History: Past Surgical History:  Procedure Laterality Date  . CATARACT EXTRACTION      . EXTRACORPOREAL SHOCK WAVE LITHOTRIPSY Left 11/14/2017   Procedure: EXTRACORPOREAL SHOCK WAVE LITHOTRIPSY (ESWL);  Surgeon: Hollice Espy, MD;  Location: [facility] ORS;  Service: Urology;  Laterality: Left;  . NASAL SINUS SURGERY    . TONSILLECTOMY    . VASECTOMY      Home Medications:  Allergies as of 12/02/2017   No Known Allergies     Medication List        Accurate as of 12/02/17 10:09 AM. Always use your most recent med list.          amLODipine 10 MG tablet Commonly known as:  NORVASC TAKE 1 TABLET (10 MG TOTAL) BY MOUTH DAILY.   cholecalciferol 1000 units tablet Commonly known as:  VITAMIN D Take 1,000 Units by mouth daily.   fluticasone 50 MCG/ACT nasal spray Commonly known as:  FLONASE Place 2 sprays into both nostrils daily.   folic acid 1 MG tablet Commonly known as:  FOLVITE Take by mouth.   methotrexate 2.5 MG tablet Commonly known as:  RHEUMATREX Take 2.5 mg by mouth. Take 8 tablets a week   mometasone 0.1 % cream Commonly known as:  ELOCON APPLY TO AFFECTED AREA EVERY DAY   multivitamin capsule Take 1 capsule by mouth daily.   naproxen sodium 220 MG tablet Commonly known as:  ALEVE Take 220 mg by mouth as needed.   omeprazole 20 MG capsule Commonly known as:  PRILOSEC TAKE 1 CAPSULE (20 MG TOTAL) BY  MOUTH DAILY.   polyethylene glycol powder powder Commonly known as:  GLYCOLAX/MIRALAX TAKE 17 G BY MOUTH DAILY AS NEEDED FOR MILD CONSTIPATION.   PROBIOTIC ADVANCED Caps Take by mouth.   SUPER B COMPLEX PO Take by mouth.   tamsulosin 0.4 MG Caps capsule Commonly known as:  FLOMAX Take 1 capsule (0.4 mg total) by mouth daily after breakfast.   traZODone 50 MG tablet Commonly known as:  DESYREL Take 0.5-1 tablets (25-50 mg total) by mouth at bedtime as needed for sleep.       Allergies: No Known Allergies  Family History: Family History  Problem Relation Age of Onset  . Arthritis Unknown        Parent  . Hypertension Unknown         Parent  . Diabetes Unknown        Parent  . Bladder Cancer Neg Hx   . Prostate cancer Neg Hx   . Kidney cancer Neg Hx     Social History:  reports that  has never smoked. he has never used smokeless tobacco. He reports that he drinks about 1.2 oz of alcohol per week. He reports that he does not use drugs.  ROS: UROLOGY Frequent Urination?: No Hard to postpone urination?: No Burning/pain with urination?: No Get up at night to urinate?: No Leakage of urine?: No Urine stream starts and stops?: No Trouble starting stream?: No Do you have to strain to urinate?: No Blood in urine?: No Urinary tract infection?: No Sexually transmitted disease?: No Injury to kidneys or bladder?: No Painful intercourse?: No Weak stream?: No Erection problems?: Yes Penile pain?: No  Gastrointestinal Nausea?: No Vomiting?: No Indigestion/heartburn?: No Diarrhea?: No Constipation?: No  Constitutional Fever: No Night sweats?: No Weight loss?: No Fatigue?: No  Skin Skin rash/lesions?: No Itching?: No  Eyes Blurred vision?: No Double vision?: No  Ears/Nose/Throat Sore throat?: No Sinus problems?: No  Hematologic/Lymphatic Swollen glands?: No Easy bruising?: No  Cardiovascular Leg swelling?: No Chest pain?: No  Respiratory Cough?: No Shortness of breath?: No  Endocrine Excessive thirst?: No  Musculoskeletal Back pain?: No Joint pain?: Yes  Neurological Headaches?: No Dizziness?: No  Psychologic Depression?: No Anxiety?: No  Physical Exam: BP 135/81 (BP Location: Right Arm, Patient Position: Sitting, Cuff Size: Large)   Pulse 70   Ht 6' (1.829 m)   Wt 257 lb 12.8 oz (116.9 kg)   BMI 34.96 kg/m   Constitutional: Well nourished. Alert and oriented, No acute distress. HEENT: [place] AT, moist mucus membranes. Trachea midline, no masses. Cardiovascular: No clubbing, cyanosis, or edema. Respiratory: Normal respiratory effort, no increased work of breathing. GI:  Abdomen is soft, non tender, non distended, no abdominal masses. Liver and spleen not palpable.  No hernias appreciated.  Stool sample for occult testing is not indicated.   GU: No CVA tenderness.  No bladder fullness or masses.   Skin: No rashes, bruises or suspicious lesions. Lymph: No cervical or inguinal adenopathy. Neurologic: Grossly intact, no focal deficits, moving all 4 extremities. Psychiatric: Normal mood and affect.   Laboratory Data: Lab Results  Component Value Date   WBC 13.0 (H) 10/31/2017   HGB 16.5 10/31/2017   HCT 48.8 10/31/2017   MCV 92.9 10/31/2017   PLT 212 10/31/2017    Lab Results  Component Value Date   CREATININE 1.10 10/31/2017    Lab Results  Component Value Date   PSA1 0.2 01/25/2017   PSA1 0.2 09/21/2015    Lab Results  Component Value  Date   HGBA1C 6.0 10/23/2017    Urinalysis Lab Results  Component Value Date   SPECGRAV 1.015 11/05/2017   PHUR 7.0 11/05/2017   COLORU Yellow 11/05/2017   APPEARANCEUR Clear 11/05/2017   LEUKOCYTESUR 1+ (A) 11/05/2017   PROTEINUR Negative 11/05/2017   GLUCOSEU Negative 11/05/2017   KETONESU Negative 11/05/2017   RBCU Negative 11/05/2017   BILIRUBINUR Negative 11/05/2017   UUROB 0.2 11/05/2017   NITRITE Negative 11/05/2017    Lab Results  Component Value Date   LABMICR See below: 11/05/2017   WBCUA 0-5 11/05/2017   RBCUA 0-2 11/05/2017   LABEPIT 0-10 11/05/2017   MUCUS Present (A) 11/05/2017   BACTERIA Few (A) 11/05/2017    I have reviewed the labs  Pertinent Imaging: CLINICAL DATA:  67 year old male post left ureteral stone lithotripsy 11/14/2017. Subsequent encounter.  EXAM: ABDOMEN - 1 VIEW  COMPARISON:  11/14/2017 plain film.  11/01/2017 CT.  FINDINGS: Left ureteral stone appears to have passed. Calcifications within the pelvis have an appearance most suggestive of phleboliths and prostate gland calcification.  IMPRESSION: Left ureteral stone appears to have  passed.   Electronically Signed   By: Genia Del M.D.   On: 12/02/2017 08:11  I have independently reviewed the films.     Assessment & Plan:   1. Left ureteral stone  - stone has passed; unable to capture fragments  2. Left hydronephrosis  - obtain RUS to ensure the hydronephrosis has resolved - will call with resutls      Zara Council, [facility]  [place] [address] [place], [place] [address] [phone]

## 2017-12-02 ENCOUNTER — Ambulatory Visit (INDEPENDENT_AMBULATORY_CARE_PROVIDER_SITE_OTHER): Payer: Medicare Other | Admitting: Urology

## 2017-12-02 ENCOUNTER — Ambulatory Visit
Admission: RE | Admit: 2017-12-02 | Discharge: 2017-12-02 | Disposition: A | Discharge status: Home / Self Care | Payer: Medicare Other | Source: Ambulatory Visit | Attending: Urology | Admitting: Urology

## 2017-12-02 ENCOUNTER — Encounter: Payer: Self-pay | Admitting: Urology

## 2017-12-02 VITALS — BP 135/81 | HR 70 | Ht 72.0 in | Wt 257.8 lb

## 2017-12-02 DIAGNOSIS — Z09 Encounter for follow-up examination after completed treatment for conditions other than malignant neoplasm: Secondary | ICD-10-CM | POA: Diagnosis not present

## 2017-12-02 DIAGNOSIS — Z87442 Personal history of urinary calculi: Secondary | ICD-10-CM | POA: Diagnosis not present

## 2017-12-02 DIAGNOSIS — N201 Calculus of ureter: Secondary | ICD-10-CM | POA: Diagnosis not present

## 2017-12-02 DIAGNOSIS — N132 Hydronephrosis with renal and ureteral calculous obstruction: Secondary | ICD-10-CM

## 2017-12-02 IMAGING — CR DG ABDOMEN 1V
1 series · 2 of 2 positions shown · non-contrast
Comparison: [DATE] plain film.  [DATE] CT.

CLINICAL DATA: 66-year-old male post left ureteral stone
lithotripsy [DATE]. Subsequent encounter.

EXAM:
ABDOMEN - 1 VIEW

[Series 1: dg abd 1 view · 0.14mm/px · 2 of 2 slices shown]
[im 1/2]
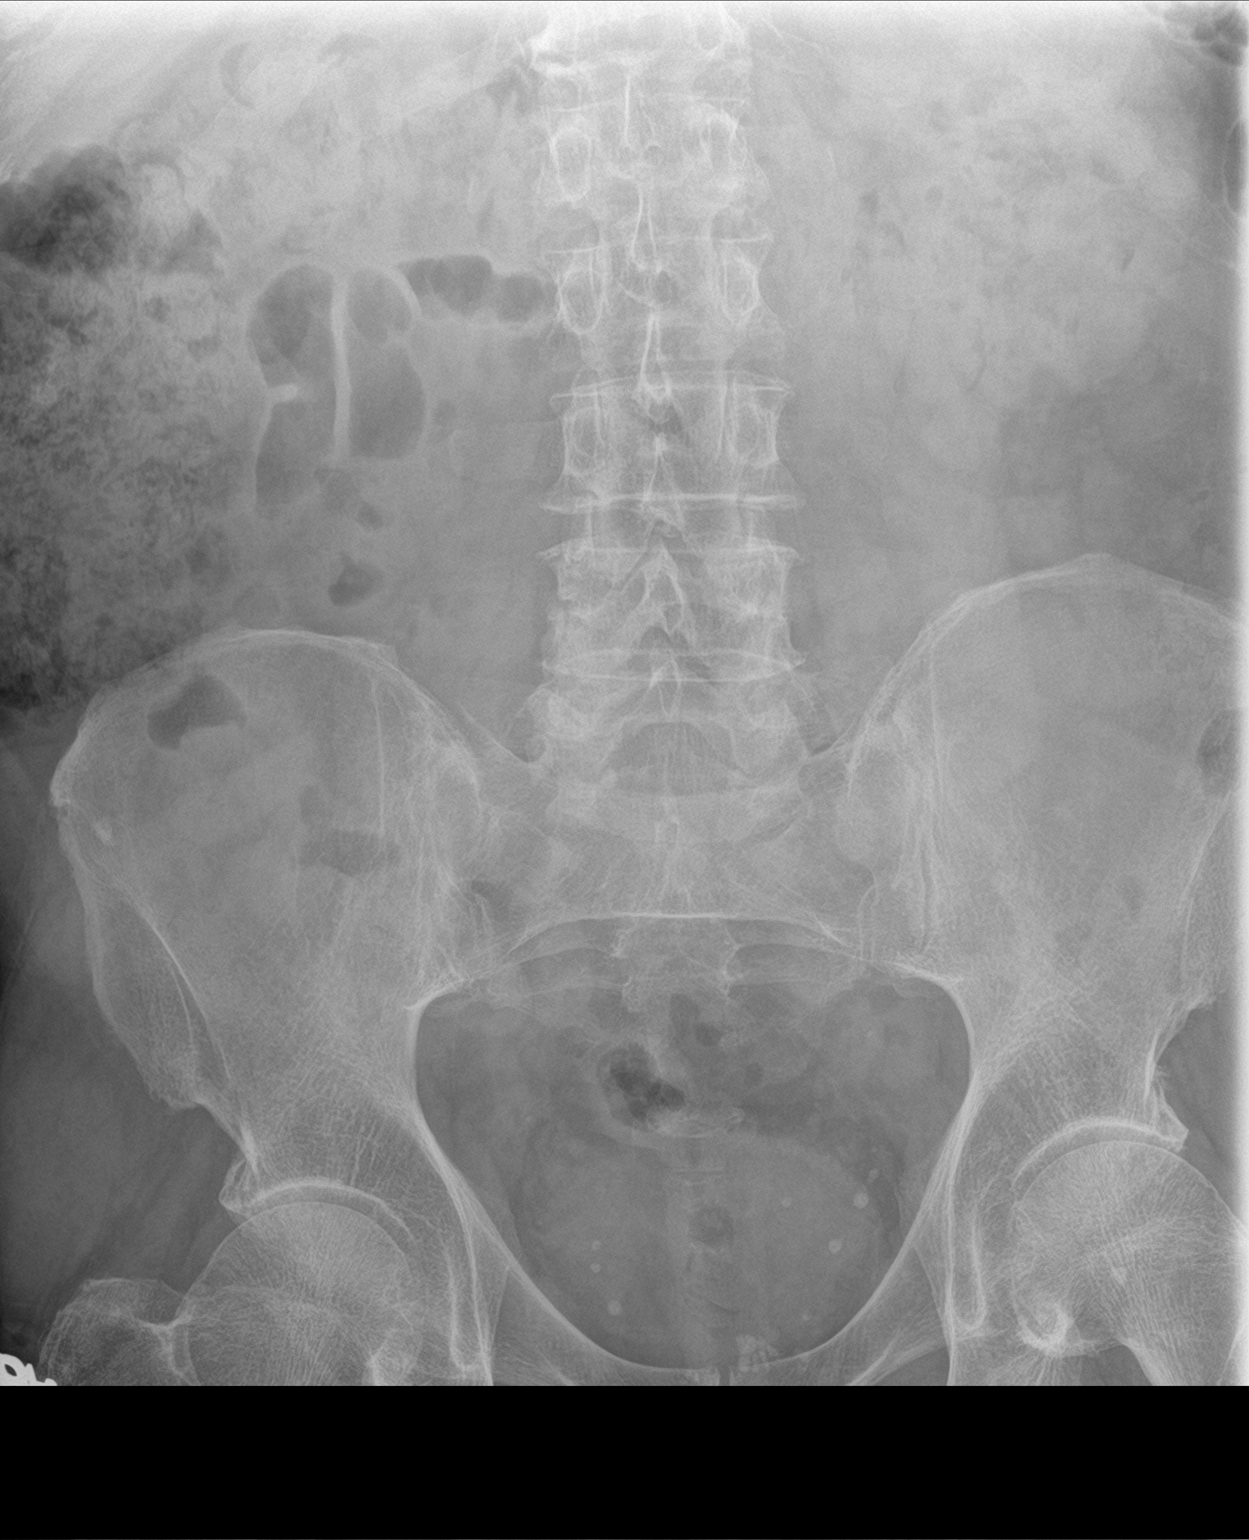
[im 2/2]
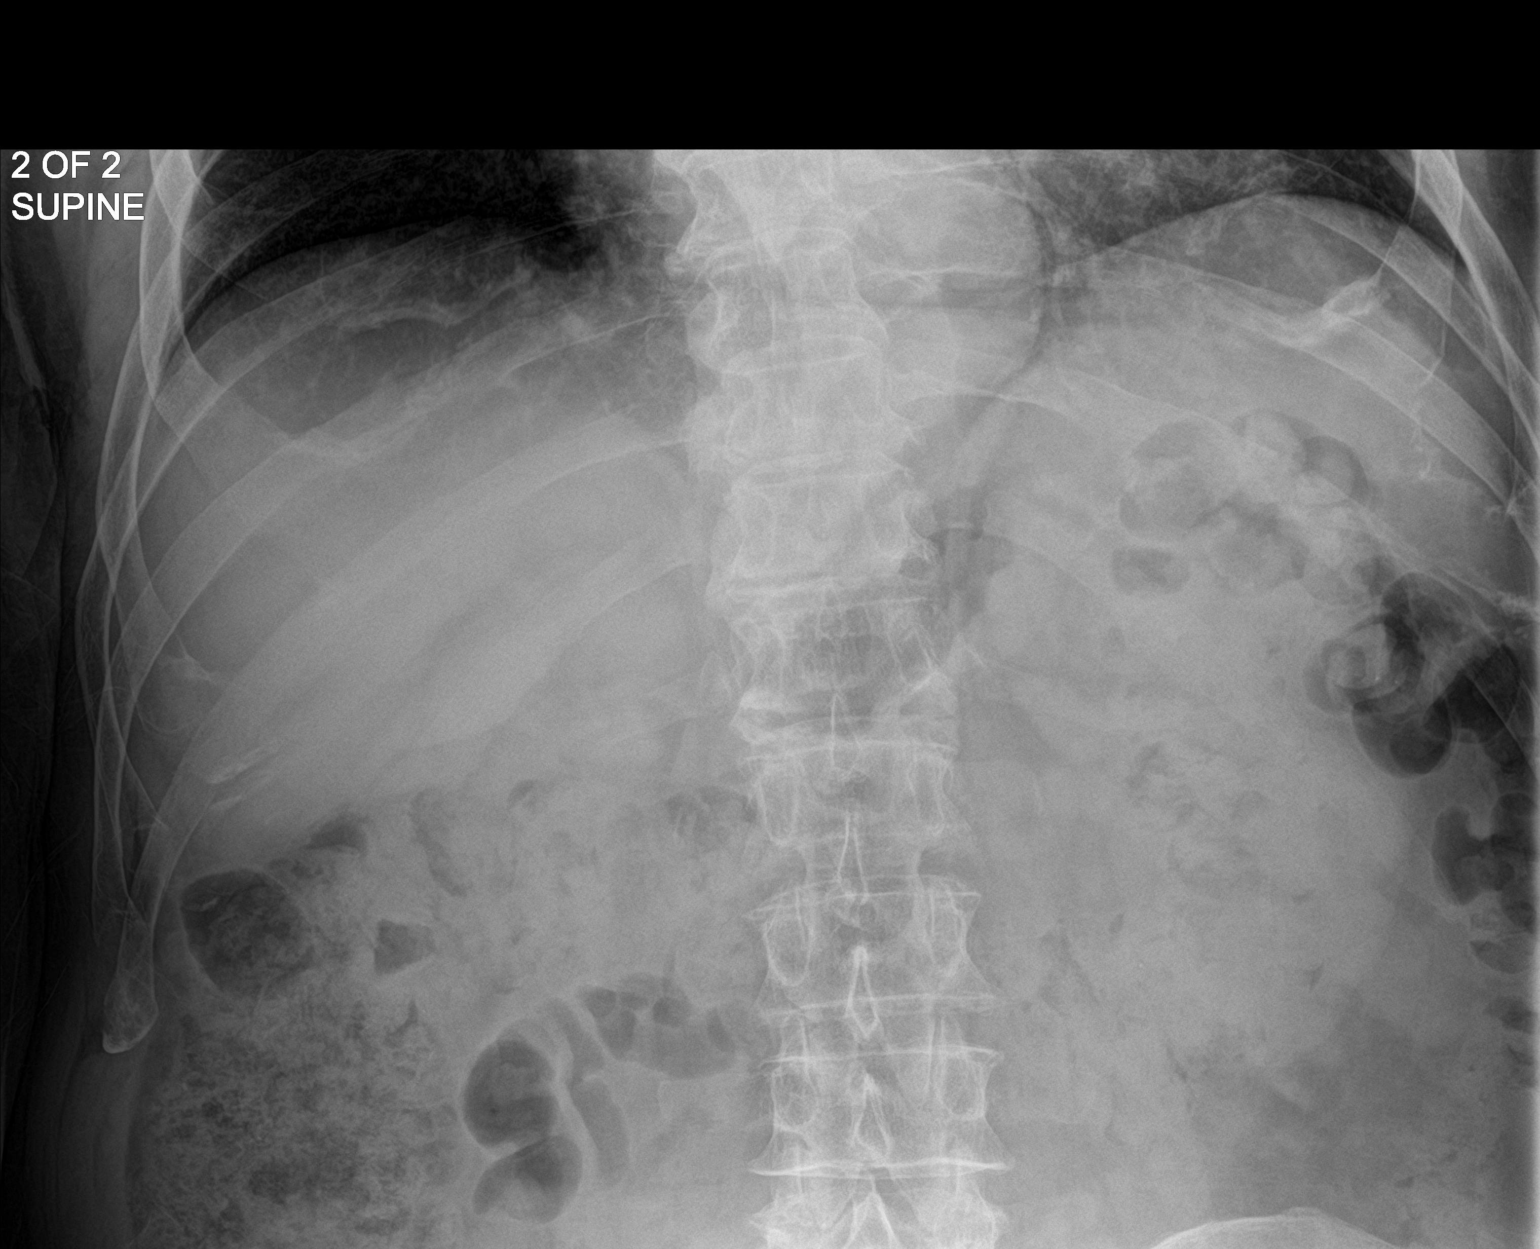

[2 of 2 positions shown; findings below may reference images not displayed]

FINDINGS: Left ureteral stone appears to have passed. Calcifications within
the pelvis have an appearance most suggestive of phleboliths and
prostate gland calcification.
IMPRESSION: Left ureteral stone appears to have passed.

## 2017-12-03 DIAGNOSIS — N200 Calculus of kidney: Secondary | ICD-10-CM | POA: Diagnosis not present

## 2017-12-09 ENCOUNTER — Other Ambulatory Visit: Payer: Self-pay

## 2017-12-09 MED ORDER — OMEPRAZOLE 20 MG PO CPDR: 20.0000 mg | DELAYED_RELEASE_CAPSULE | Freq: Every day | ORAL | 1 refills | Status: DC

## 2017-12-09 MED ORDER — AMLODIPINE BESYLATE 10 MG PO TABS: 10.0000 mg | ORAL_TABLET | Freq: Every day | ORAL | 1 refills | Status: DC

## 2017-12-16 ENCOUNTER — Ambulatory Visit
Admission: RE | Admit: 2017-12-16 | Discharge: 2017-12-16 | Disposition: A | Discharge status: Home / Self Care | Payer: Medicare Other | Source: Ambulatory Visit | Attending: Urology | Admitting: Urology

## 2017-12-16 ENCOUNTER — Other Ambulatory Visit: Payer: Self-pay | Admitting: Urology

## 2017-12-16 DIAGNOSIS — N201 Calculus of ureter: Secondary | ICD-10-CM | POA: Diagnosis not present

## 2017-12-16 DIAGNOSIS — N281 Cyst of kidney, acquired: Secondary | ICD-10-CM | POA: Diagnosis not present

## 2017-12-16 DIAGNOSIS — N132 Hydronephrosis with renal and ureteral calculous obstruction: Secondary | ICD-10-CM | POA: Diagnosis not present

## 2017-12-24 DIAGNOSIS — H353222 Exudative age-related macular degeneration, left eye, with inactive choroidal neovascularization: Secondary | ICD-10-CM | POA: Diagnosis not present

## 2017-12-25 DIAGNOSIS — L409 Psoriasis, unspecified: Secondary | ICD-10-CM | POA: Diagnosis not present

## 2017-12-25 DIAGNOSIS — L4050 Arthropathic psoriasis, unspecified: Secondary | ICD-10-CM | POA: Diagnosis not present

## 2017-12-25 DIAGNOSIS — Z79899 Other long term (current) drug therapy: Secondary | ICD-10-CM | POA: Diagnosis not present

## 2018-01-06 DIAGNOSIS — Z23 Encounter for immunization: Secondary | ICD-10-CM | POA: Diagnosis not present

## 2018-01-07 ENCOUNTER — Telehealth: Payer: Self-pay

## 2018-01-07 NOTE — Telephone Encounter (Signed)
Letter sent.

## 2018-01-07 NOTE — Telephone Encounter (Signed)
-----   Message from Nori Riis, PA-C sent at 01/07/2018  2:28 PM EST ----- Please let [name] know that his stone was made of calcium oxalate.  He needs to increase water intake to 2.5 L daily, avoid sodas and reduce salt in his diet.

## 2018-01-07 NOTE — Telephone Encounter (Signed)
-----   Message from Nori Riis, PA-C sent at 01/07/2018  2:27 PM EST ----- Please let [name] know that his RUS was normal.

## 2018-01-13 DIAGNOSIS — H353211 Exudative age-related macular degeneration, right eye, with active choroidal neovascularization: Secondary | ICD-10-CM | POA: Diagnosis not present

## 2018-01-23 ENCOUNTER — Other Ambulatory Visit: Payer: Medicare Other

## 2018-01-23 ENCOUNTER — Other Ambulatory Visit: Payer: Self-pay

## 2018-01-23 DIAGNOSIS — N402 Nodular prostate without lower urinary tract symptoms: Secondary | ICD-10-CM

## 2018-01-24 ENCOUNTER — Ambulatory Visit (INDEPENDENT_AMBULATORY_CARE_PROVIDER_SITE_OTHER): Payer: Medicare Other | Admitting: Urology

## 2018-01-24 ENCOUNTER — Encounter: Payer: Self-pay | Admitting: Urology

## 2018-01-24 VITALS — BP 132/91 | HR 73 | Ht 72.0 in | Wt 260.0 lb

## 2018-01-24 DIAGNOSIS — N529 Male erectile dysfunction, unspecified: Secondary | ICD-10-CM | POA: Diagnosis not present

## 2018-01-24 DIAGNOSIS — Z87442 Personal history of urinary calculi: Secondary | ICD-10-CM

## 2018-01-24 DIAGNOSIS — N402 Nodular prostate without lower urinary tract symptoms: Secondary | ICD-10-CM

## 2018-01-24 LAB — PSA: Prostate Specific Ag, Serum: 0.2 ng/mL (ref 0.0–4.0)

## 2018-01-24 NOTE — Progress Notes (Signed)
01/24/2018 9:55 AM   Wayne Scott [birth year] [id number]  Referring provider: Leone Haven, MD [address] [place], [place] [address]  Chief Complaint  Patient presents with  . Follow-up    HPI:  67 year old male with a history of prostate nodule, erectile dysfunction, and personal history of kidney stones who returns today for his annual urologic follow-up.   He has long-standing refractory erectile dysfunction. He has tried P5 inhibitors including Cialis, Levitra, and Viagra which are minimally effective and also cause side effects which are unfavorable.   Over the past 6 years, he's been managed with intracavernosal injections.   He denies any history of penile trauma or curvature.  He is now using 90 units Trimix #5 (papaverine 30 mg, phen 1mg , prost 10 mcg.  He has an excellent response and seems to be working well.  He is overall pleased with this for the time being.   His most recent PSA was  0.2 ng/dL 2 days ago and remains stable.  He also has a prostate nodule which has been stable for many years.  On most recent CT abdomen pelvis in late 2018, a calcification measuring the size and location of this nodule is appreciated.  He does have a personal history of nephrolithiasis.  He underwent shockwave lithotripsy in December 2018 which was successful.  This is first and only stone episode.  He said no recurrent stone episodes, flank pain, or gross hematuria.   He denies any urinary symptoms today.  No dysuria hematuria frequency or urgency.  Takes no medications for his prostate.    PMH: Past Medical History:  Diagnosis Date  . Allergic rhinitis   . Arthritis   . GERD (gastroesophageal reflux disease)   . Headache   . Heart murmur   . Hypertension   . Macular degeneration   . OSA (obstructive sleep apnea)    uses CPAP  . Psoriasis     Surgical History: Past Surgical History:  Procedure Laterality Date  . CATARACT EXTRACTION    .  EXTRACORPOREAL SHOCK WAVE LITHOTRIPSY Left 11/14/2017   Procedure: EXTRACORPOREAL SHOCK WAVE LITHOTRIPSY (ESWL);  Surgeon: Hollice Espy, MD;  Location: [facility] ORS;  Service: Urology;  Laterality: Left;  . NASAL SINUS SURGERY    . TONSILLECTOMY    . VASECTOMY      Home Medications:  Allergies as of 01/24/2018   No Known Allergies     Medication List        Accurate as of 01/24/18  9:55 AM. Always use your most recent med list.          amLODipine 10 MG tablet Commonly known as:  NORVASC Take 1 tablet (10 mg total) by mouth daily.   cholecalciferol 1000 units tablet Commonly known as:  VITAMIN D Take 1,000 Units by mouth daily.   fluticasone 50 MCG/ACT nasal spray Commonly known as:  FLONASE Place 2 sprays into both nostrils daily.   folic acid 1 MG tablet Commonly known as:  FOLVITE Take by mouth.   methotrexate 2.5 MG tablet Commonly known as:  RHEUMATREX Take 2.5 mg by mouth. Take 8 tablets a week   mometasone 0.1 % cream Commonly known as:  ELOCON APPLY TO AFFECTED AREA EVERY DAY   multivitamin capsule Take 1 capsule by mouth daily.   naproxen sodium 220 MG tablet Commonly known as:  ALEVE Take 220 mg by mouth as needed.   omeprazole 20 MG capsule Commonly known as:  PRILOSEC Take 1  capsule (20 mg total) by mouth daily.   polyethylene glycol powder powder Commonly known as:  GLYCOLAX/MIRALAX TAKE 17 G BY MOUTH DAILY AS NEEDED FOR MILD CONSTIPATION.   PROBIOTIC ADVANCED Caps Take by mouth.   SUPER B COMPLEX PO Take by mouth.   traZODone 50 MG tablet Commonly known as:  DESYREL Take 0.5-1 tablets (25-50 mg total) by mouth at bedtime as needed for sleep.       Allergies: No Known Allergies  Family History: Family History  Problem Relation Age of Onset  . Arthritis Unknown        Parent  . Hypertension Unknown        Parent  . Diabetes Unknown        Parent  . Bladder Cancer Neg Hx   . Prostate cancer Neg Hx   . Kidney cancer Neg Hx      Social History:  reports that  has never smoked. he has never used smokeless tobacco. He reports that he drinks about 1.2 oz of alcohol per week. He reports that he does not use drugs.  ROS: UROLOGY Frequent Urination?: No Hard to postpone urination?: No Burning/pain with urination?: No Get up at night to urinate?: No Leakage of urine?: No Urine stream starts and stops?: No Trouble starting stream?: No Do you have to strain to urinate?: No Blood in urine?: No Urinary tract infection?: No Sexually transmitted disease?: No Injury to kidneys or bladder?: No Painful intercourse?: No Weak stream?: No Erection problems?: Yes Penile pain?: No  Gastrointestinal Nausea?: No Vomiting?: No Indigestion/heartburn?: No Diarrhea?: No Constipation?: No  Constitutional Fever: No Night sweats?: No Weight loss?: No Fatigue?: No  Skin Skin rash/lesions?: No Itching?: No  Eyes Blurred vision?: No Double vision?: No  Ears/Nose/Throat Sore throat?: No Sinus problems?: No  Hematologic/Lymphatic Swollen glands?: No Easy bruising?: No  Cardiovascular Leg swelling?: No Chest pain?: No  Respiratory Cough?: No Shortness of breath?: No  Endocrine Excessive thirst?: No  Musculoskeletal Back pain?: No Joint pain?: Yes  Neurological Headaches?: No Dizziness?: No  Psychologic Depression?: No Anxiety?: No  Physical Exam: BP (!) 132/91   Pulse 73   Ht 6' (1.829 m)   Wt 260 lb (117.9 kg)   BMI 35.26 kg/m   Constitutional:  Alert and oriented, No acute distress. HEENT: [place] AT, moist mucus membranes.  Trachea midline, no masses. Cardiovascular: No clubbing, cyanosis, or edema. Respiratory: Normal respiratory effort, no increased work of breathing. GI: Abdomen is soft, nontender, nondistended, no abdominal masses GU: No CVA tenderness.   Testicles descended bilaterally, no masses, nontender. Normal circumcised phallus with orthotopic patent urethral meatus. No  palpable penile plaques.  Rectal exam:  Normal sphincter tone. Enlarged 50 cc prostate with hard nodule at apex, approximately 1.5 cm.   Skin: No rashes, bruises or suspicious lesions. Neurologic: Grossly intact, no focal deficits, moving all 4 extremities. Psychiatric: Normal mood and affect.  Laboratory Data: Lab Results  Component Value Date   WBC 13.0 (H) 10/31/2017   HGB 16.5 10/31/2017   HCT 48.8 10/31/2017   MCV 92.9 10/31/2017   PLT 212 10/31/2017    Lab Results  Component Value Date   CREATININE 1.10 10/31/2017     Lab Results  Component Value Date   HGBA1C 6.0 10/23/2017   Component     Latest Ref Rng & Units 09/21/2015 01/25/2017 01/23/2018  Prostate Specific Ag, Serum     0.0 - 4.0 ng/mL 0.2 0.2 0.2    Pertinent Imaging: n/a  Assessment & Plan:    1. Erectile dysfunction unresponsive to phosphodiesterase type 5 inhibitor Doing well with tri-mix #5, 90 units- requests refills today with 20 injections at a time  2. History of stones Reviewed stone prevention techniques Plan for KUB in 1 year  3. Nodular prostate Relatively stable prostatic nodule In comparison to CT scan, there is a large dense calcification at this exact position which is reassuring PSA is stable which is also reassuring Will continue to follow  Return in about 1 year (around 01/25/2019) for PSA/ IPSS.  Hollice Espy, MD  [facility] [address], [place], [place] [address] [phone]

## 2018-02-12 ENCOUNTER — Other Ambulatory Visit: Payer: Self-pay | Admitting: Family Medicine

## 2018-02-25 DIAGNOSIS — H353222 Exudative age-related macular degeneration, left eye, with inactive choroidal neovascularization: Secondary | ICD-10-CM | POA: Diagnosis not present

## 2018-02-25 DIAGNOSIS — H353211 Exudative age-related macular degeneration, right eye, with active choroidal neovascularization: Secondary | ICD-10-CM | POA: Diagnosis not present

## 2018-02-27 DIAGNOSIS — L4050 Arthropathic psoriasis, unspecified: Secondary | ICD-10-CM | POA: Diagnosis not present

## 2018-02-27 DIAGNOSIS — L409 Psoriasis, unspecified: Secondary | ICD-10-CM | POA: Diagnosis not present

## 2018-02-27 DIAGNOSIS — Z79899 Other long term (current) drug therapy: Secondary | ICD-10-CM | POA: Diagnosis not present

## 2018-03-19 DIAGNOSIS — H40003 Preglaucoma, unspecified, bilateral: Secondary | ICD-10-CM | POA: Diagnosis not present

## 2018-03-20 IMAGING — US US RENAL
1 series · 14 of 25 positions shown · non-contrast
Comparison: CT abdomen [DATE]

CLINICAL DATA: Left ureteral calculi.  Hydronephrosis.

EXAM:
RENAL / URINARY TRACT ULTRASOUND COMPLETE

[Series 1: us renal · 0.25mm/px · 14 of 37 slices shown]
[im 1/37]
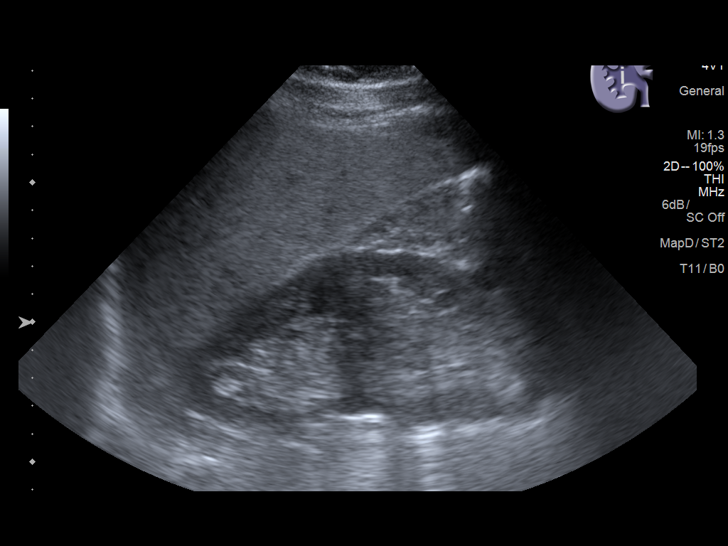
[im 4/37]
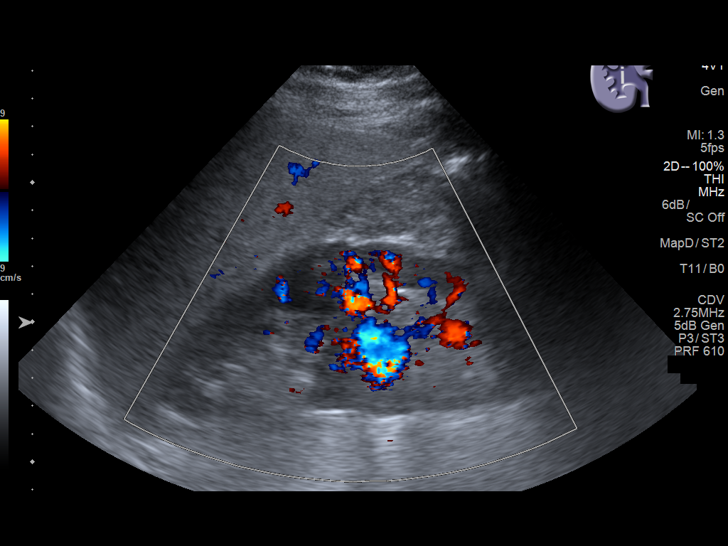
[im 7/37]
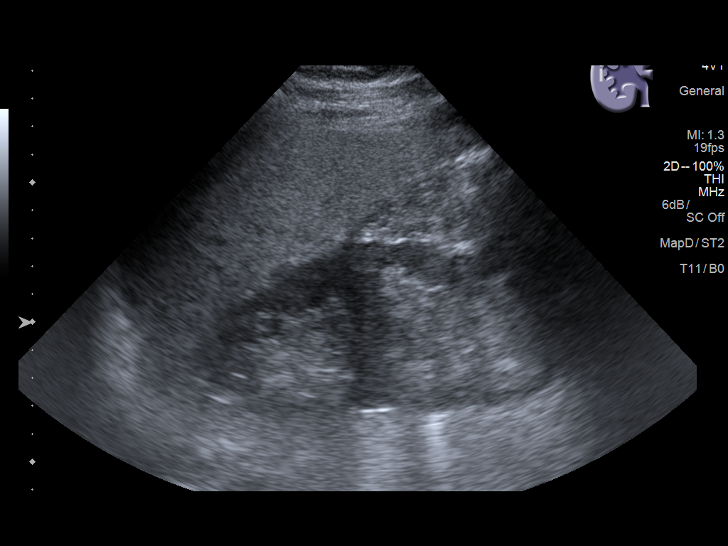
[im 10/37]
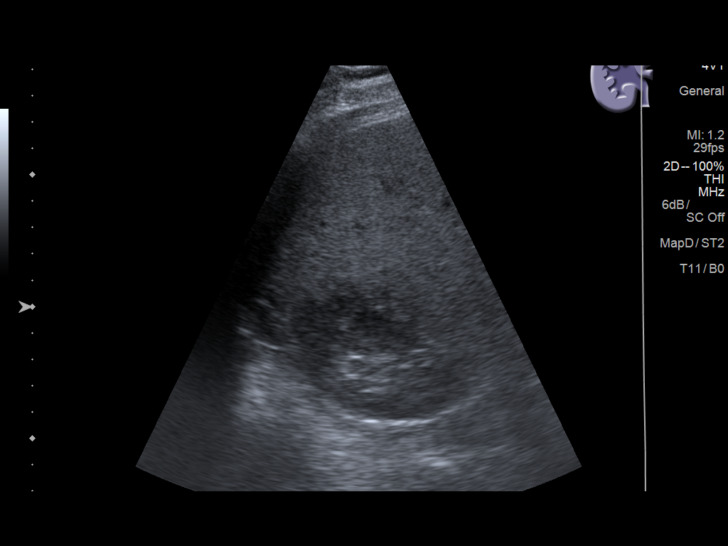
[im 13/37]
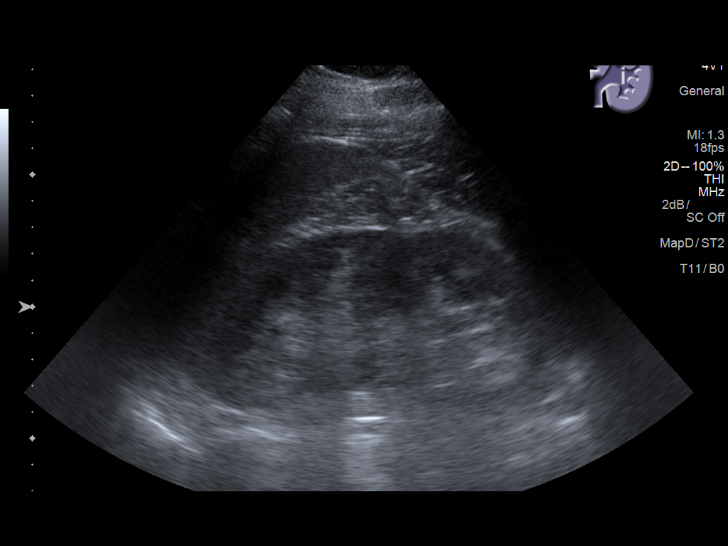
[im 14/37]
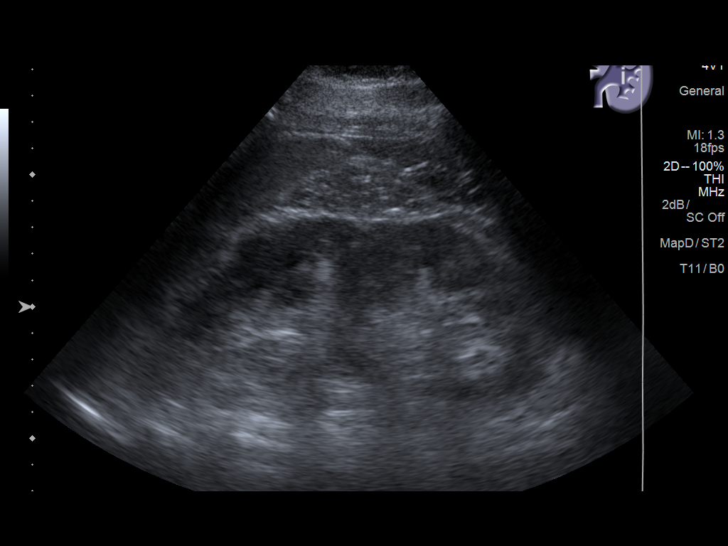
[im 17/37]
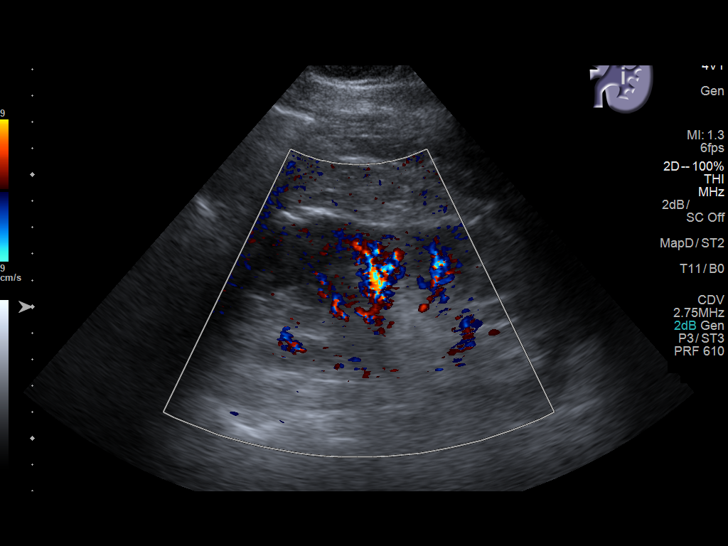
[im 20/37]
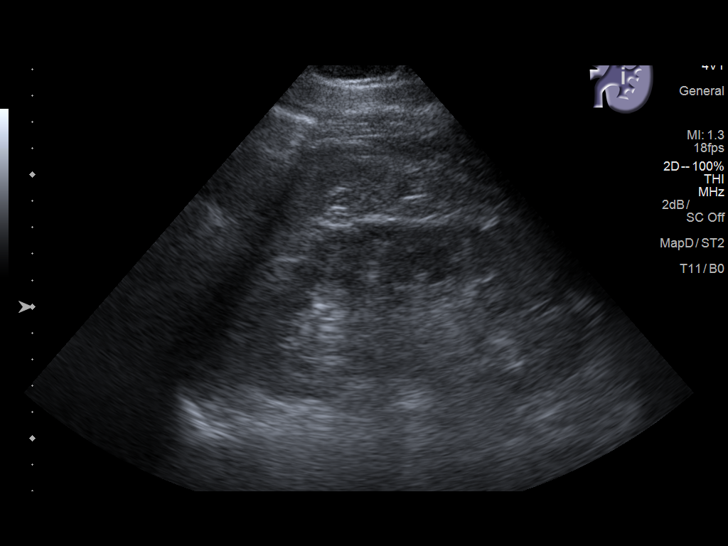
[im 23/37]
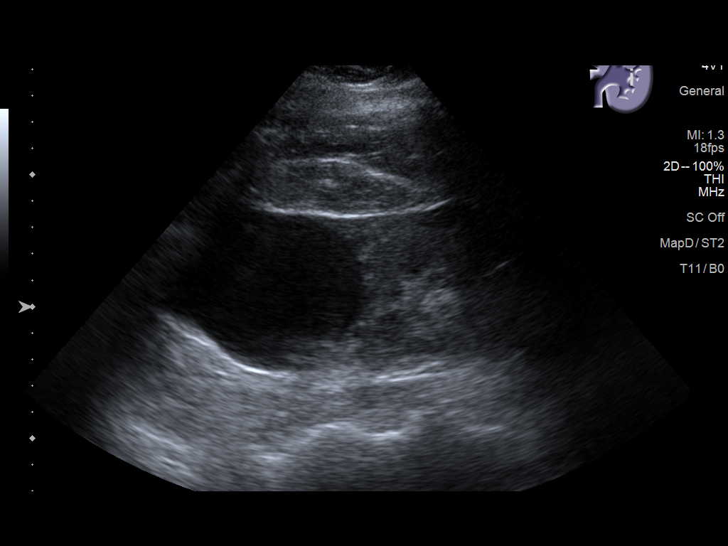
[im 25/37]
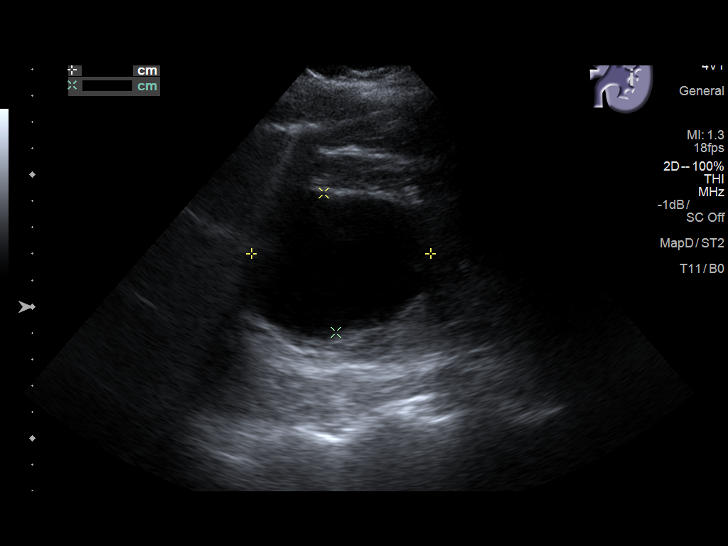
[im 28/37]
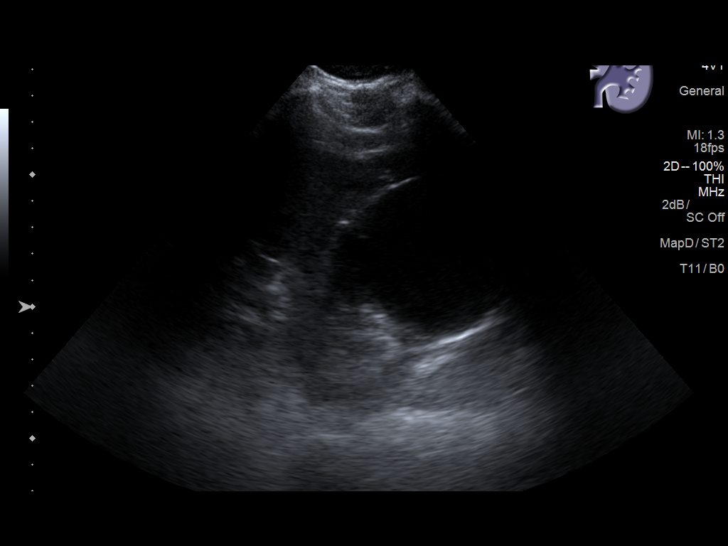
[im 31/37]
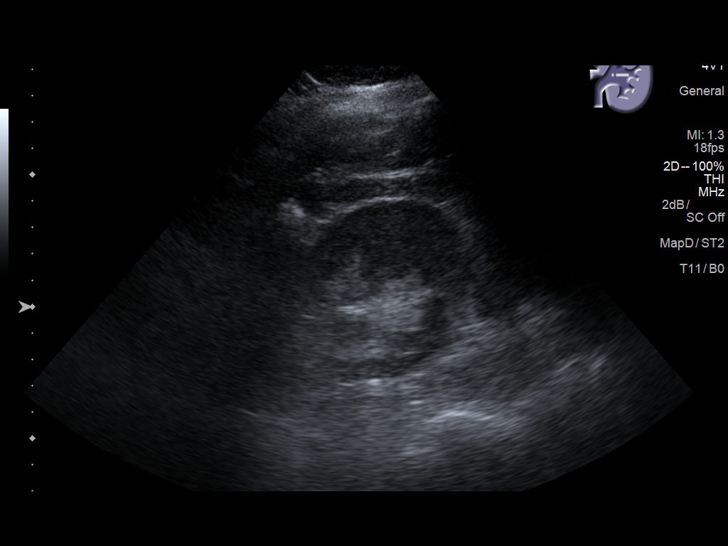
[im 34/37]
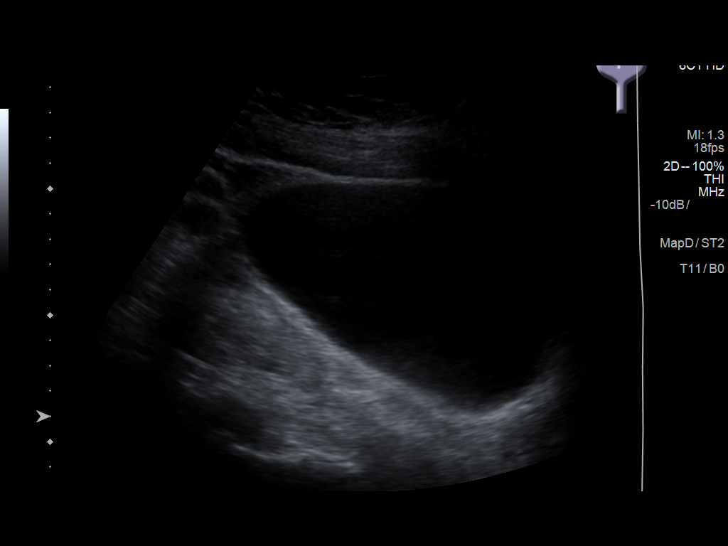
[im 37/37]
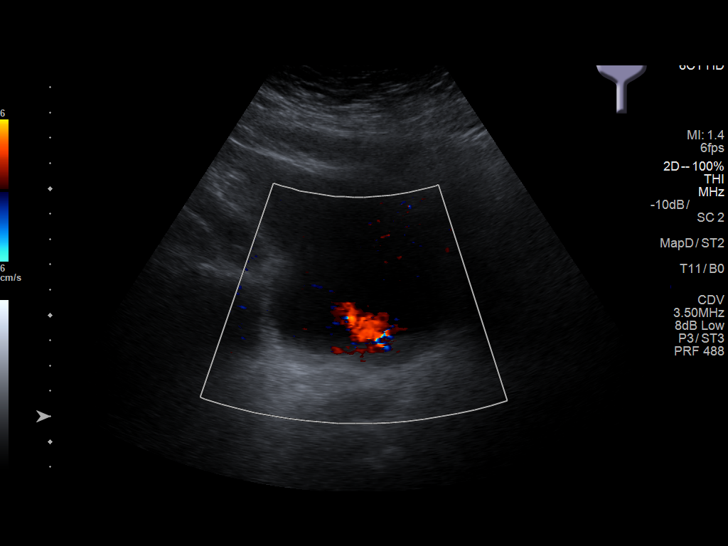

[14 of 25 positions shown; findings below may reference images not displayed]

FINDINGS: Right Kidney:

Length: 13.4 cm. Echogenicity within normal limits. No mass or
hydronephrosis visualized.

Left Kidney:

Length: 14.2 cm. Echogenicity within normal limits. 6.8 x 5.3 x
cm anechoic left upper pole renal mass most consistent with a cyst.
No solid mass or hydronephrosis visualized.

Bladder:

Appears normal for degree of bladder distention.
IMPRESSION: 1. No urolithiasis or obstructive uropathy.
2. Left renal cyst.

## 2018-04-01 DIAGNOSIS — H353222 Exudative age-related macular degeneration, left eye, with inactive choroidal neovascularization: Secondary | ICD-10-CM | POA: Diagnosis not present

## 2018-04-08 DIAGNOSIS — H353211 Exudative age-related macular degeneration, right eye, with active choroidal neovascularization: Secondary | ICD-10-CM | POA: Diagnosis not present

## 2018-04-10 ENCOUNTER — Telehealth: Payer: Self-pay

## 2018-04-10 NOTE — Telephone Encounter (Signed)
[facility] is requesting authorization to refill pt Trimix.   Okay to refill?

## 2018-04-10 NOTE — Telephone Encounter (Signed)
Yes  Hollice Espy, MD

## 2018-04-11 ENCOUNTER — Telehealth: Payer: Self-pay | Admitting: Urology

## 2018-04-11 NOTE — Telephone Encounter (Signed)
Pt would like to get 2 months worth of syringes at one time.  He can save money by doing it this way.  Please call or fax prescription for him today.  Please call pt and let him know when this is done.  [phone]

## 2018-04-14 NOTE — Telephone Encounter (Signed)
Please call pt.  He's called about his Trimix refill and no one has returned his call.

## 2018-04-14 NOTE — Telephone Encounter (Signed)
Refill called into [facility] and pt informed. Pt is upset about the length of time it took to be called in. He states this has happened in the past and it bothers him. He would like to speak with the office manager about how long it is taking these things getting done. I apologized to the pt and informed him that staffing was low on Friday, which is no excuse, but that's why it was delayed. Please call pt to speak with him.

## 2018-04-17 DIAGNOSIS — L4050 Arthropathic psoriasis, unspecified: Secondary | ICD-10-CM | POA: Diagnosis not present

## 2018-05-07 DIAGNOSIS — H40003 Preglaucoma, unspecified, bilateral: Secondary | ICD-10-CM | POA: Diagnosis not present

## 2018-05-21 DIAGNOSIS — H40003 Preglaucoma, unspecified, bilateral: Secondary | ICD-10-CM | POA: Diagnosis not present

## 2018-05-28 DIAGNOSIS — L4050 Arthropathic psoriasis, unspecified: Secondary | ICD-10-CM | POA: Diagnosis not present

## 2018-05-28 DIAGNOSIS — Z79899 Other long term (current) drug therapy: Secondary | ICD-10-CM | POA: Diagnosis not present

## 2018-05-28 DIAGNOSIS — L409 Psoriasis, unspecified: Secondary | ICD-10-CM | POA: Diagnosis not present

## 2018-06-03 DIAGNOSIS — H353211 Exudative age-related macular degeneration, right eye, with active choroidal neovascularization: Secondary | ICD-10-CM | POA: Diagnosis not present

## 2018-06-04 ENCOUNTER — Ambulatory Visit (INDEPENDENT_AMBULATORY_CARE_PROVIDER_SITE_OTHER): Payer: Medicare Other

## 2018-06-04 VITALS — BP 110/70 | HR 66 | Temp 98.2°F | Resp 16 | Ht 71.0 in | Wt 256.8 lb

## 2018-06-04 DIAGNOSIS — Z0000 Encounter for general adult medical examination without abnormal findings: Secondary | ICD-10-CM

## 2018-06-04 NOTE — Progress Notes (Signed)
Subjective:   Wayne Scott is a 67 y.o. male who presents for an Initial Medicare Annual Wellness Visit.  Review of Systems  No ROS.  Medicare Wellness Visit. Additional risk factors are reflected in the social history. Cardiac Risk Factors include: advanced age (>33men, >32 women);male gender;hypertension;obesity (BMI >30kg/m2)    Objective:    Today's Vitals   06/04/18 1015  BP: 110/70  Pulse: 66  Resp: 16  Temp: 98.2 F (36.8 C)  TempSrc: Oral  SpO2: 96%  Weight: 256 lb 12.8 oz (116.5 kg)  Height: 5\' 11"  (1.803 m)   Body mass index is 35.82 kg/m.  Advanced Directives 06/04/2018  Does Patient Have a Medical Advance Directive? No  Would patient like information on creating a medical advance directive? No - Patient declined    Current Medications (verified) Outpatient Encounter Medications as of 06/04/2018  Medication Sig  . amLODipine (NORVASC) 10 MG tablet Take 1 tablet (10 mg total) by mouth daily.  . B Complex-C (SUPER B COMPLEX PO) Take by mouth.  . cholecalciferol (VITAMIN D) 1000 units tablet Take 1,000 Units by mouth daily.  . fluticasone (FLONASE) 50 MCG/ACT nasal spray Place 2 sprays into both nostrils daily.  . methotrexate (RHEUMATREX) 2.5 MG tablet Take 2.5 mg by mouth. Take 8 tablets a week  . mometasone (ELOCON) 0.1 % cream APPLY TO AFFECTED AREA EVERY DAY  . Multiple Vitamin (MULTIVITAMIN) capsule Take 1 capsule by mouth daily.  . naproxen sodium (ANAPROX) 220 MG tablet Take 220 mg by mouth as needed.  Marland Kitchen omeprazole (PRILOSEC) 20 MG capsule Take 1 capsule (20 mg total) by mouth daily.  Marland Kitchen omeprazole (PRILOSEC) 20 MG capsule TAKE 1 CAPSULE (20 MG TOTAL) BY MOUTH DAILY.  Marland Kitchen polyethylene glycol powder (GLYCOLAX/MIRALAX) powder TAKE 17 G BY MOUTH DAILY AS NEEDED FOR MILD CONSTIPATION.  . Probiotic Product (PROBIOTIC ADVANCED) CAPS Take by mouth.  . Secukinumab (COSENTYX [place]) Inject 1 application into the skin every 30 (thirty) days.  . traZODone (DESYREL)  50 MG tablet Take 0.5-1 tablets (25-50 mg total) by mouth at bedtime as needed for sleep.   No facility-administered encounter medications on file as of 06/04/2018.     Allergies (verified) Patient has no known allergies.   History: Past Medical History:  Diagnosis Date  . Allergic rhinitis   . Arthritis   . GERD (gastroesophageal reflux disease)   . Headache   . Heart murmur   . Hypertension   . Macular degeneration   . OSA (obstructive sleep apnea)    uses CPAP  . Psoriasis    Past Surgical History:  Procedure Laterality Date  . CATARACT EXTRACTION    . EXTRACORPOREAL SHOCK WAVE LITHOTRIPSY Left 11/14/2017   Procedure: EXTRACORPOREAL SHOCK WAVE LITHOTRIPSY (ESWL);  Surgeon: Hollice Espy, MD;  Location: [facility] ORS;  Service: Urology;  Laterality: Left;  . NASAL SINUS SURGERY    . TONSILLECTOMY    . VASECTOMY     Family History  Problem Relation Age of Onset  . Neuropathy Mother   . Stroke Mother   . Macular degeneration Mother   . Diabetes Father   . Arthritis Unknown        Parent  . Hypertension Unknown        Parent  . Diabetes Unknown        Parent  . Bladder Cancer Neg Hx   . Prostate cancer Neg Hx   . Kidney cancer Neg Hx    Social History   Socioeconomic  History  . Marital status: Married    Spouse name: Not on file  . Number of children: Not on file  . Years of education: Not on file  . Highest education level: Not on file  Occupational History  . Not on file  Social Needs  . Financial resource strain: Not hard at all  . Food insecurity:    Worry: Never true    Inability: Never true  . Transportation needs:    Medical: No    Non-medical: No  Tobacco Use  . Smoking status: Never Smoker  . Smokeless tobacco: Never Used  Substance and Sexual Activity  . Alcohol use: Yes    Alcohol/week: 1.2 oz    Types: 1 Cans of beer, 1 Shots of liquor per week    Comment: once or twice a month  . Drug use: No  . Sexual activity: Not on file  Lifestyle   . Physical activity:    Days per week: 3 days    Minutes per session: 60 min  . Stress: Only a little  Relationships  . Social connections:    Talks on phone: Not on file    Gets together: Not on file    Attends religious service: Not on file    Active member of club or organization: Not on file    Attends meetings of clubs or organizations: Not on file    Relationship status: Not on file  Other Topics Concern  . Not on file  Social History Narrative  . Not on file   Tobacco Counseling Counseling given: Not Answered   Clinical Intake:  Pre-visit preparation completed: Yes  Pain : No/denies pain     Nutritional Status: BMI > 30  Obese Diabetes: No  How often do you need to have someone help you when you read instructions, pamphlets, or other written materials from your doctor or pharmacy?: 1 - Never  Interpreter Needed?: No     Activities of Daily Living In your present state of health, do you have any difficulty performing the following activities: 06/04/2018  Hearing? N  Vision? N  Difficulty concentrating or making decisions? N  Walking or climbing stairs? N  Dressing or bathing? N  Doing errands, shopping? N  Preparing Food and eating ? N  Using the Toilet? N  In the past six months, have you accidently leaked urine? N  Do you have problems with loss of bowel control? N  Managing your Medications? N  Managing your Finances? N  Housekeeping or managing your Housekeeping? N  Some recent data might be hidden     Immunizations and Health Maintenance Immunization History  Administered Date(s) Administered  . Influenza,inj,Quad PF,6+ Mos 09/21/2015  . Influenza-Unspecified 12/04/2016  . Pneumococcal Conjugate-13 12/04/2016   Health Maintenance Due  Topic Date Due  . Hepatitis C Screening  03-29-1951  . TETANUS/TDAP  10/25/1969    Patient Care Team: Leone Haven, MD as PCP - General (Family Medicine)  Indicate any recent Medical Services you  may have received from other than [facility] providers in the past year (date may be approximate).    Assessment:   This is a routine wellness examination for Wayne Scott.  The goal of the wellness visit is to assist the patient how to close the gaps in care and create a preventative care plan for the patient.   The roster of all physicians providing medical care to patient is listed in the Snapshot section of the chart.  Osteoporosis risk  reviewed.    Safety issues reviewed; Smoke and carbon monoxide detectors in the home. No firearms in the home. Wears seatbelts when driving or riding with others. No violence in the home.  They do not have excessive sun exposure.  Discussed the need for sun protection: hats, long sleeves and the use of sunscreen if there is significant sun exposure.  Patient is alert, normal appearance, oriented to person/place/and time. Correctly identified the president of the [place] and recalls of 3/3 words.Performs simple calculations and can read correct time from watch face. Displays appropriate judgement.  No new identified risk were noted.  No failures at ADL's or IADL's.    BMI- discussed the importance of a healthy diet, water intake and the benefits of aerobic exercise. Educational material provided.   24 hour diet recall: Regular diet  Dental- every 6 months.  Sleep patterns- Sleeps 5-6 hours at night. CPAP in use. Naps during the day.   Hepatitis C Screening discussed.   TDAP vaccine deferred per patient preference.  Follow up with insurance.  Educational material provided.  Patient Concerns: None at this time. Follow up with PCP as needed.  Hearing/Vision screen Hearing Screening Comments: Followed by [facility] every 6 months  Hearing aid, bilateral Vision Screening Comments: Followed by [facility] Wears corrective lenses Last OV 05/2018 Cataract extraction, bilateral Visual acuity not assessed per patient preference since they have  regular follow up with the ophthalmologist  Dietary issues and exercise activities discussed: Current Exercise Habits: Home exercise routine, Type of exercise: strength training/weights, Time (Minutes): 60, Frequency (Times/Week): 3, Weekly Exercise (Minutes/Week): 180, Intensity: Moderate  Goals    . Weight 230-235lb     Portion control Increase lean proteins Low carb diet       Depression Screen PHQ 2/9 Scores 06/04/2018 01/18/2017  PHQ - 2 Score 0 0    Fall Risk Fall Risk  06/04/2018 01/18/2017  Falls in the past year? No No   Cognitive Function: MMSE - Mini Mental State Exam 06/04/2018  Orientation to time 5  Orientation to Place 5  Registration 3  Attention/ Calculation 5  Recall 3  Language- name 2 objects 2  Language- repeat 1  Language- follow 3 step command 3  Language- read & follow direction 1  Write a sentence 1  Copy design 1  Total score 30        Screening Tests Health Maintenance  Topic Date Due  . Hepatitis C Screening  05/08/1951  . TETANUS/TDAP  10/25/1969  . INFLUENZA VACCINE  06/26/2018  . Fecal DNA (Cologuard)  10/31/2020  . PNA vac Low Risk Adult  Addressed      Plan:   End of life planning; Advanced aging; Advanced directives discussed.  No HCPOA/Living Will.  Additional information declined at this time.  I have personally reviewed and noted the following in the patient's chart:   . Medical and social history . Use of alcohol, tobacco or illicit drugs  . Current medications and supplements . Functional ability and status . Nutritional status . Physical activity . Advanced directives . List of other physicians . Hospitalizations, surgeries, and ER visits in previous 12 months . Vitals . Screenings to include cognitive, depression, and falls . Referrals and appointments  In addition, I have reviewed and discussed with patient certain preventive protocols, quality metrics, and best practice recommendations. A written personalized  care plan for preventive services as well as general preventive health recommendations were provided to patient.  Varney Biles, LPN   9/84/2103

## 2018-06-04 NOTE — Patient Instructions (Addendum)
  Wayne Scott , Thank you for taking time to come for your Medicare Wellness Visit. I appreciate your ongoing commitment to your health goals. Please review the following plan we discussed and let me know if I can assist you in the future.    Wayne Scott , Thank you for taking time to come for your Medicare Wellness Visit. I appreciate your ongoing commitment to your health goals. Please review the following plan we discussed and let me know if I can assist you in the future.   These are the goals we discussed: Goals    . Weight 230-235lb     Portion control Increase lean proteins Low carb diet        This is a list of the screening recommended for you and due dates:  Health Maintenance  Topic Date Due  .  Hepatitis C: One time screening is recommended by Center for Disease Control  (CDC) for  adults born from [birth year] through 1966.   1951/02/22  . Tetanus Vaccine  10/25/1969  . Flu Shot  06/26/2018  . Cologuard (Stool DNA test)  10/31/2020  . Pneumonia vaccines  Addressed    These are the goals we discussed: Goals    . Weight 230-235lb     Portion control Increase lean proteins Low carb diet        This is a list of the screening recommended for you and due dates:  Health Maintenance  Topic Date Due  .  Hepatitis C: One time screening is recommended by Center for Disease Control  (CDC) for  adults born from [birth year] through 1966.   1951-05-10  . Tetanus Vaccine  10/25/1969  . Flu Shot  06/26/2018  . Cologuard (Stool DNA test)  10/31/2020  . Pneumonia vaccines  Addressed

## 2018-07-01 ENCOUNTER — Telehealth: Payer: Self-pay | Admitting: Urology

## 2018-07-01 ENCOUNTER — Other Ambulatory Visit: Payer: Self-pay

## 2018-07-01 ENCOUNTER — Ambulatory Visit (INDEPENDENT_AMBULATORY_CARE_PROVIDER_SITE_OTHER): Payer: Medicare Other

## 2018-07-01 VITALS — BP 125/74 | HR 64 | Ht 72.0 in | Wt 256.7 lb

## 2018-07-01 DIAGNOSIS — H353222 Exudative age-related macular degeneration, left eye, with inactive choroidal neovascularization: Secondary | ICD-10-CM | POA: Diagnosis not present

## 2018-07-01 DIAGNOSIS — Z87442 Personal history of urinary calculi: Secondary | ICD-10-CM

## 2018-07-01 DIAGNOSIS — R300 Dysuria: Secondary | ICD-10-CM | POA: Diagnosis not present

## 2018-07-01 DIAGNOSIS — R350 Frequency of micturition: Secondary | ICD-10-CM

## 2018-07-01 LAB — URINALYSIS, COMPLETE
Bilirubin, UA: NEGATIVE
Glucose, UA: NEGATIVE
Ketones, UA: NEGATIVE
Leukocytes, UA: NEGATIVE
Nitrite, UA: NEGATIVE
Protein, UA: NEGATIVE
RBC, UA: NEGATIVE
Specific Gravity, UA: 1.015 (ref 1.005–1.030)
Urobilinogen, Ur: 1 mg/dL (ref 0.2–1.0)
pH, UA: 6.5 (ref 5.0–7.5)

## 2018-07-01 LAB — MICROSCOPIC EXAMINATION
Epithelial Cells (non renal): NONE SEEN /hpf (ref 0–10)
RBC, UA: NONE SEEN /hpf (ref 0–2)
WBC, UA: NONE SEEN /hpf (ref 0–5)

## 2018-07-01 LAB — BLADDER SCAN AMB NON-IMAGING

## 2018-07-01 NOTE — Telephone Encounter (Signed)
Nurse visit added for UTI symptoms, including frequency and burning.

## 2018-07-01 NOTE — Progress Notes (Signed)
Monta Police is present in the office today for a nurse visit. He is complaining of urinary frequency and urgency. He denies being on antibiotics in the past 30 days, denies being on suppressive antibiotics, and denies urological surgery in the past thirty days. Per the pt, he has no allergies to any medications. A urine sample was collected for analysis and culture. A bladder scan was performed on pt, noting 281ml. Pt was instructed to follow up with Dr. Erlene Quan and a culture would be sent out.

## 2018-07-04 LAB — CULTURE, URINE COMPREHENSIVE

## 2018-07-07 ENCOUNTER — Encounter: Payer: Self-pay | Admitting: Urology

## 2018-07-07 NOTE — Telephone Encounter (Signed)
I spoke with patient today and was able to answer his questions regarding the bladder scan that was performed during his nurse visit.  He will monitor his symptoms and gather his questions for his follow up visit with Dr. Erlene Quan next month.

## 2018-08-04 DIAGNOSIS — H353211 Exudative age-related macular degeneration, right eye, with active choroidal neovascularization: Secondary | ICD-10-CM | POA: Diagnosis not present

## 2018-08-20 ENCOUNTER — Encounter: Payer: Self-pay | Admitting: Urology

## 2018-08-22 ENCOUNTER — Ambulatory Visit: Payer: Medicare Other | Admitting: Urology

## 2018-08-28 DIAGNOSIS — L4050 Arthropathic psoriasis, unspecified: Secondary | ICD-10-CM | POA: Diagnosis not present

## 2018-08-28 DIAGNOSIS — Z79899 Other long term (current) drug therapy: Secondary | ICD-10-CM | POA: Diagnosis not present

## 2018-08-28 DIAGNOSIS — L409 Psoriasis, unspecified: Secondary | ICD-10-CM | POA: Diagnosis not present

## 2018-09-24 ENCOUNTER — Other Ambulatory Visit: Payer: Self-pay | Admitting: Family Medicine

## 2018-09-29 DIAGNOSIS — H353211 Exudative age-related macular degeneration, right eye, with active choroidal neovascularization: Secondary | ICD-10-CM | POA: Diagnosis not present

## 2018-10-03 ENCOUNTER — Telehealth: Payer: Self-pay | Admitting: Cardiovascular Disease

## 2018-10-03 NOTE — Telephone Encounter (Signed)
Patient calling  States when he last saw Dr. Rockey Situ, 10/19/2015, Dr. Rockey Situ mentioned whenever patient was ready to schedule a calcium CT he should call and get one set up Patient does not have an order but is interested  Please call to discuss

## 2018-10-03 NOTE — Telephone Encounter (Signed)
Spoke with patient and he wanted to know about the CT Calcium testing to include pricing and what it looks for. Reviewed all of this information with him in detail and he would like to discuss with his wife and then call back. Reviewed that it is $150.00 done in [place] and that he just needs to let me know and I will place order in and provide him with the number to call and schedule. He verbalized understanding with no further questions at this time.

## 2018-10-07 DIAGNOSIS — Z79899 Other long term (current) drug therapy: Secondary | ICD-10-CM | POA: Diagnosis not present

## 2018-10-07 DIAGNOSIS — L4050 Arthropathic psoriasis, unspecified: Secondary | ICD-10-CM | POA: Diagnosis not present

## 2018-10-07 DIAGNOSIS — M545 Low back pain: Secondary | ICD-10-CM | POA: Diagnosis not present

## 2018-10-07 DIAGNOSIS — L409 Psoriasis, unspecified: Secondary | ICD-10-CM | POA: Diagnosis not present

## 2018-10-07 DIAGNOSIS — M25552 Pain in left hip: Secondary | ICD-10-CM | POA: Diagnosis not present

## 2018-10-07 DIAGNOSIS — M79605 Pain in left leg: Secondary | ICD-10-CM | POA: Diagnosis not present

## 2018-10-09 DIAGNOSIS — Z23 Encounter for immunization: Secondary | ICD-10-CM | POA: Diagnosis not present

## 2018-10-13 DIAGNOSIS — H353222 Exudative age-related macular degeneration, left eye, with inactive choroidal neovascularization: Secondary | ICD-10-CM | POA: Diagnosis not present

## 2018-10-30 DIAGNOSIS — M5442 Lumbago with sciatica, left side: Secondary | ICD-10-CM | POA: Diagnosis not present

## 2018-10-30 DIAGNOSIS — G8929 Other chronic pain: Secondary | ICD-10-CM | POA: Diagnosis not present

## 2018-11-05 DIAGNOSIS — M5442 Lumbago with sciatica, left side: Secondary | ICD-10-CM | POA: Diagnosis not present

## 2018-11-05 DIAGNOSIS — G8929 Other chronic pain: Secondary | ICD-10-CM | POA: Diagnosis not present

## 2018-11-14 DIAGNOSIS — H40003 Preglaucoma, unspecified, bilateral: Secondary | ICD-10-CM | POA: Diagnosis not present

## 2018-11-24 DIAGNOSIS — L409 Psoriasis, unspecified: Secondary | ICD-10-CM | POA: Diagnosis not present

## 2018-11-24 DIAGNOSIS — L4050 Arthropathic psoriasis, unspecified: Secondary | ICD-10-CM | POA: Diagnosis not present

## 2018-11-24 DIAGNOSIS — Z79899 Other long term (current) drug therapy: Secondary | ICD-10-CM | POA: Diagnosis not present

## 2018-12-08 DIAGNOSIS — H353211 Exudative age-related macular degeneration, right eye, with active choroidal neovascularization: Secondary | ICD-10-CM | POA: Diagnosis not present

## 2018-12-22 ENCOUNTER — Other Ambulatory Visit: Payer: Self-pay | Admitting: Family Medicine

## 2019-01-19 DIAGNOSIS — H353222 Exudative age-related macular degeneration, left eye, with inactive choroidal neovascularization: Secondary | ICD-10-CM | POA: Diagnosis not present

## 2019-01-20 ENCOUNTER — Other Ambulatory Visit: Payer: Self-pay

## 2019-01-20 DIAGNOSIS — Z125 Encounter for screening for malignant neoplasm of prostate: Secondary | ICD-10-CM

## 2019-01-20 DIAGNOSIS — N402 Nodular prostate without lower urinary tract symptoms: Secondary | ICD-10-CM

## 2019-01-21 ENCOUNTER — Other Ambulatory Visit: Payer: Medicare Other

## 2019-01-21 DIAGNOSIS — N402 Nodular prostate without lower urinary tract symptoms: Secondary | ICD-10-CM

## 2019-01-22 LAB — PSA: Prostate Specific Ag, Serum: 0.2 ng/mL (ref 0.0–4.0)

## 2019-01-27 ENCOUNTER — Encounter: Payer: Self-pay | Admitting: Urology

## 2019-01-27 ENCOUNTER — Ambulatory Visit (INDEPENDENT_AMBULATORY_CARE_PROVIDER_SITE_OTHER): Payer: Medicare Other | Admitting: Urology

## 2019-01-27 VITALS — BP 146/8 | HR 83 | Ht 72.0 in | Wt 255.0 lb

## 2019-01-27 DIAGNOSIS — Z125 Encounter for screening for malignant neoplasm of prostate: Secondary | ICD-10-CM

## 2019-01-27 DIAGNOSIS — N529 Male erectile dysfunction, unspecified: Secondary | ICD-10-CM | POA: Diagnosis not present

## 2019-01-27 DIAGNOSIS — N402 Nodular prostate without lower urinary tract symptoms: Secondary | ICD-10-CM

## 2019-01-27 DIAGNOSIS — Z87442 Personal history of urinary calculi: Secondary | ICD-10-CM

## 2019-01-27 MED ORDER — NONFORMULARY OR COMPOUNDED ITEM: 11 refills | Status: DC

## 2019-01-27 NOTE — Progress Notes (Signed)
01/27/2019 9:28 AM   Elissa Hefty 28-Jan-1951 [id number]  Referring provider: Leone Haven, MD [address] [place], [place] [address]  Chief Complaint  Patient presents with  . Erectile Dysfunction    1 year follow  up    HPI: Wayne Scott is a 68 yo M with a history of prostate nodule, erectile dysfunction, and personal history of kidney stones returns today for his annual urologic f/u.   Erectile dysfunction He has long-standing refractory erectile dysfunction. He has tried P5 inhibitors including Cialis, Levitra, and Viagra which are minimally effective and also cause side effects which are unfavorable. Over the past 6 years, he's been managed with intracavernosal injections. He was using 90 units Trimix #5 (papaverine 30 mg, phen 1mg , prost 10 mcg.  He is now using 125 units of Trimix #5 (papaverine 30 mg, phen 1mg , prost 10 mcg) which is effective. He hopes to a have a more concentrated Trimix for better effect. No side effects.    History of Stones He does have a personal history of nephrolithiasis.  He underwent shockwave lithotripsy in December 2018 which was successful. This was his first and only stone episode.    He said no recurrent stone episodes, flank pain, or gross hematuria.  No recent imaging.    Nodular Prostate  His most recent PSA was 0.2 ng/dL from 01/21/2019 and remains stable.   He denies any urinary symptoms today. No dysuria, hematuria, frequency, or urgency. Takes no medications for his prostate.     PMH: Past Medical History:  Diagnosis Date  . Allergic rhinitis   . Arthritis   . GERD (gastroesophageal reflux disease)   . Headache   . Heart murmur   . Hypertension   . Macular degeneration   . OSA (obstructive sleep apnea)    uses CPAP  . Psoriasis     Surgical History: Past Surgical History:  Procedure Laterality Date  . CATARACT EXTRACTION    . EXTRACORPOREAL SHOCK WAVE LITHOTRIPSY Left 11/14/2017   Procedure: EXTRACORPOREAL SHOCK WAVE LITHOTRIPSY (ESWL);  Surgeon: Hollice Espy, MD;  Location: [facility] ORS;  Service: Urology;  Laterality: Left;  . NASAL SINUS SURGERY    . TONSILLECTOMY    . VASECTOMY      Home Medications:  Allergies as of 01/27/2019   No Known Allergies     Medication List       Accurate as of January 27, 2019  9:28 AM. Always use your most recent med list.        amLODipine 10 MG tablet Commonly known as:  NORVASC TAKE 1 TABLET DAILY   cholecalciferol 1000 units tablet Commonly known as:  VITAMIN D Take 1,000 Units by mouth daily.   COSENTYX [place] Inject 1 application into the skin every 30 (thirty) days.   fluticasone 50 MCG/ACT nasal spray Commonly known as:  FLONASE Place 2 sprays into both nostrils daily.   mometasone 0.1 % cream Commonly known as:  ELOCON APPLY TO AFFECTED AREA EVERY DAY   multivitamin capsule Take 1 capsule by mouth daily.   naproxen sodium 220 MG tablet Commonly known as:  ALEVE Take 220 mg by mouth as needed.   NONFORMULARY OR COMPOUNDED ITEM 125 mg.   omeprazole 20 MG capsule Commonly known as:  PRILOSEC Take 1 capsule (20 mg total) by mouth daily.   omeprazole 20 MG capsule Commonly known as:  PRILOSEC TAKE 1 CAPSULE (20 MG TOTAL) BY MOUTH DAILY.   polyethylene glycol powder  powder Commonly known as:  GLYCOLAX/MIRALAX TAKE 17 G BY MOUTH DAILY AS NEEDED FOR MILD CONSTIPATION.   PROBIOTIC ADVANCED Caps Take by mouth.   SUPER B COMPLEX PO Take by mouth.       Allergies: No Known Allergies  Family History: Family History  Problem Relation Age of Onset  . Neuropathy Mother   . Stroke Mother   . Macular degeneration Mother   . Diabetes Father   . Arthritis Unknown        Parent  . Hypertension Unknown        Parent  . Diabetes Unknown        Parent  . Bladder Cancer Neg Hx   . Prostate cancer Neg Hx   . Kidney cancer Neg Hx     Social History:  reports that he has never smoked. He has never  used smokeless tobacco. He reports current alcohol use of about 2.0 standard drinks of alcohol per week. He reports that he does not use drugs.  ROS: UROLOGY Frequent Urination?: No Hard to postpone urination?: No Burning/pain with urination?: No Get up at night to urinate?: Yes Leakage of urine?: No Urine stream starts and stops?: No Trouble starting stream?: No Do you have to strain to urinate?: No Blood in urine?: No Urinary tract infection?: No Sexually transmitted disease?: No Injury to kidneys or bladder?: No Painful intercourse?: No Weak stream?: No Erection problems?: Yes Penile pain?: No  Gastrointestinal Nausea?: No Vomiting?: No Indigestion/heartburn?: No Diarrhea?: No Constipation?: Yes  Constitutional Fever: No Night sweats?: No Weight loss?: No Fatigue?: No  Skin Skin rash/lesions?: No Itching?: No  Eyes Blurred vision?: No Double vision?: No  Ears/Nose/Throat Sore throat?: No Sinus problems?: No  Hematologic/Lymphatic Swollen glands?: No Easy bruising?: No  Cardiovascular Leg swelling?: No Chest pain?: No  Respiratory Cough?: No Shortness of breath?: No  Endocrine Excessive thirst?: No  Musculoskeletal Back pain?: No Joint pain?: Yes  Neurological Headaches?: No Dizziness?: No  Psychologic Depression?: No Anxiety?: No  Physical Exam: BP (!) 146/8   Pulse 83   Ht 6' (1.829 m)   Wt 255 lb (115.7 kg)   BMI 34.58 kg/m   Constitutional:  Alert and oriented, No acute distress. HEENT: [place] AT, moist mucus membranes.  Trachea midline, no masses. Cardiovascular: No clubbing, cyanosis, or edema. Respiratory: Normal respiratory effort, no increased work of breathing. GI: Abdomen is soft, nontender, nondistended, no abdominal masses GU: No CVA tenderness.   Testicles descended bilaterally, no masses, nontender. Normal circumcised phallus with orthotopic patent urethral meatus. No palpable penile plaques.  Rectal exam:  Normal  sphincter tone. Enlarged 50 cc prostate with hard nodule at apex, approximately 1.5 cm, stable from last year.   Skin: No rashes, bruises or suspicious lesions. Neurologic: Grossly intact, no focal deficits, moving all 4 extremities. Psychiatric: Normal mood and affect.  Assessment & Plan:    1. Erectile dysfunction unresponsive to phosphodiesterase type 5 inhibitor Doing well with tri-mix #5, 120 units- requests refills today with 20 injections at a time Pt interested in more concentration medication; Recommended Transition to Super Trimix starting on 45 units and titrate up to 100 units as needed Pt agreeable to treatment   2. History of stones Asymptomatic  Return for KUB if symptoms reoccur (Explained to pt 50% chance of stone reoccurance)   3. 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  [facility] [address], [place], [place] [address] [phone]  I, Lucas Mallow, am acting as a scribe for Dr. Hollice Espy,  I have reviewed the above documentation for accuracy and completeness, and I agree with the above.   Hollice Espy, MD

## 2019-01-27 NOTE — Addendum Note (Signed)
Addended by: Hollice Espy on: 01/27/2019 09:50 AM   Modules accepted: Orders

## 2019-01-27 NOTE — Addendum Note (Signed)
Addended by: Hollice Espy on: 01/27/2019 09:51 AM   Modules accepted: Orders

## 2019-02-09 DIAGNOSIS — H353211 Exudative age-related macular degeneration, right eye, with active choroidal neovascularization: Secondary | ICD-10-CM | POA: Diagnosis not present

## 2019-03-23 ENCOUNTER — Other Ambulatory Visit: Payer: Self-pay | Admitting: Family Medicine

## 2019-03-23 NOTE — Telephone Encounter (Signed)
Pt needs a OV for more refills

## 2019-03-31 ENCOUNTER — Encounter: Payer: Self-pay | Admitting: Family Medicine

## 2019-03-31 ENCOUNTER — Other Ambulatory Visit: Payer: Self-pay

## 2019-03-31 ENCOUNTER — Ambulatory Visit (INDEPENDENT_AMBULATORY_CARE_PROVIDER_SITE_OTHER): Payer: Medicare Other | Admitting: Family Medicine

## 2019-03-31 ENCOUNTER — Telehealth: Payer: Self-pay | Admitting: Family Medicine

## 2019-03-31 DIAGNOSIS — L4050 Arthropathic psoriasis, unspecified: Secondary | ICD-10-CM | POA: Diagnosis not present

## 2019-03-31 DIAGNOSIS — R7303 Prediabetes: Secondary | ICD-10-CM | POA: Diagnosis not present

## 2019-03-31 DIAGNOSIS — K5900 Constipation, unspecified: Secondary | ICD-10-CM | POA: Diagnosis not present

## 2019-03-31 DIAGNOSIS — F419 Anxiety disorder, unspecified: Secondary | ICD-10-CM

## 2019-03-31 DIAGNOSIS — I10 Essential (primary) hypertension: Secondary | ICD-10-CM

## 2019-03-31 DIAGNOSIS — K219 Gastro-esophageal reflux disease without esophagitis: Secondary | ICD-10-CM | POA: Diagnosis not present

## 2019-03-31 DIAGNOSIS — G4733 Obstructive sleep apnea (adult) (pediatric): Secondary | ICD-10-CM

## 2019-03-31 DIAGNOSIS — E119 Type 2 diabetes mellitus without complications: Secondary | ICD-10-CM | POA: Insufficient documentation

## 2019-03-31 MED ORDER — ESCITALOPRAM OXALATE 10 MG PO TABS: 10.0000 mg | ORAL_TABLET | Freq: Every day | ORAL | 1 refills | Status: DC

## 2019-03-31 MED ORDER — OMEPRAZOLE 20 MG PO CPDR: 20.0000 mg | DELAYED_RELEASE_CAPSULE | Freq: Every day | ORAL | 1 refills | Status: DC

## 2019-03-31 NOTE — Telephone Encounter (Signed)
Pt has been scheduled for a lab appt and 2-3 month follow up not appt available in July only same day but pt has been scheduled for Ov OR DOXYME APPT 07/01/2019 is this ok?

## 2019-03-31 NOTE — Telephone Encounter (Signed)
Please contact the patient and get him set up for lab work in 1 month.  He needs follow-up in 2 months.  This could be in the office or a virtual visit.

## 2019-03-31 NOTE — Assessment & Plan Note (Signed)
Check A1c. 

## 2019-03-31 NOTE — Assessment & Plan Note (Signed)
Stable.  Continue fiber and MiraLAX.

## 2019-03-31 NOTE — Progress Notes (Addendum)
Virtual Visit via video Note  This visit type was conducted due to national recommendations for restrictions regarding the COVID-19 pandemic (e.g. social distancing).  This format is felt to be most appropriate for this patient at this time.  All issues noted in this document were discussed and addressed.  No physical exam was performed (except for noted visual exam findings with Video Visits).   I connected with Wayne Scott on 03/31/19 at  8:00 AM EDT by a video enabled telemedicine application and verified that I am speaking with the correct person using two identifiers. Location patient: home Location provider: work Persons participating in the virtual visit: patient, provider  I discussed the limitations, risks, security and privacy concerns of performing an evaluation and management service by telephone and the availability of in person appointments. I also discussed with the patient that there may be a patient responsible charge related to this service. The patient expressed understanding and agreed to proceed.  Reason for visit: follow-up  HPI: HYPERTENSION  Disease Monitoring  Home BP Monitoring 120s/80s Chest pain- no    Dyspnea- no Medications  Compliance-  Taking amlodipine.  Edema- no  GERD:   Reflux symptoms: not when taking omeprazole   Abd pain: no   Blood in stool: no  Dysphagia: no   EGD: none  Medication: omeprazole  Psoriatic arthritis: seeing rheumatology. Currently on cosentyx. Notes this has helped significantly. His psoriasis rash has resolved with this.   Constipation: periodically has issues with this. He takes fiber and miralax with good benefit. BMs 2-3x/day with this regimen.  OSA: Using his CPAP nightly.  He only sleeps 4 to 7 hours due to difficulty sleeping.  He does not wake up quite as well rested as he would like.  Trouble sleeping: Patient notes that he has not been sleeping great.  He will wake up after 4 to 5 hours.  He does note some anxiety  and he worries about things.  That is been going on for a number of years off and on.  He notes no significant depression.  No SI.  He has been more moody recently.  Rare alcohol intake.  He only has caffeine in the morning.  He has no trouble falling asleep.  He has taken Lexapro in the past with good benefit.     ROS: See pertinent positives and negatives per HPI.  Past Medical History:  Diagnosis Date  . Allergic rhinitis   . Arthritis   . GERD (gastroesophageal reflux disease)   . Headache   . Heart murmur   . Hypertension   . Macular degeneration   . OSA (obstructive sleep apnea)    uses CPAP  . Psoriasis     Past Surgical History:  Procedure Laterality Date  . CATARACT EXTRACTION    . EXTRACORPOREAL SHOCK WAVE LITHOTRIPSY Left 11/14/2017   Procedure: EXTRACORPOREAL SHOCK WAVE LITHOTRIPSY (ESWL);  Surgeon: Hollice Espy, MD;  Location: [facility] ORS;  Service: Urology;  Laterality: Left;  . NASAL SINUS SURGERY    . TONSILLECTOMY    . VASECTOMY      Family History  Problem Relation Age of Onset  . Neuropathy Mother   . Stroke Mother   . Macular degeneration Mother   . Diabetes Father   . Arthritis Unknown        Parent  . Hypertension Unknown        Parent  . Diabetes Unknown        Parent  . Bladder Cancer Neg  Hx   . Prostate cancer Neg Hx   . Kidney cancer Neg Hx     SOCIAL HX: Non-smoker.   Current Outpatient Medications:  .  amLODipine (NORVASC) 10 MG tablet, TAKE 1 TABLET DAILY, Disp: 90 tablet, Rfl: 0 .  B Complex-C (SUPER B COMPLEX PO), Take by mouth., Disp: , Rfl:  .  cholecalciferol (VITAMIN D) 1000 units tablet, Take 1,000 Units by mouth daily., Disp: , Rfl:  .  fluticasone (FLONASE) 50 MCG/ACT nasal spray, Place 2 sprays into both nostrils daily., Disp: 16 g, Rfl: 6 .  mometasone (ELOCON) 0.1 % cream, APPLY TO AFFECTED AREA EVERY DAY, Disp: 45 g, Rfl: 6 .  Multiple Vitamin (MULTIVITAMIN) capsule, Take 1 capsule by mouth daily., Disp: , Rfl:  .   naproxen sodium (ANAPROX) 220 MG tablet, Take 220 mg by mouth as needed., Disp: , Rfl:  .  NONFORMULARY OR COMPOUNDED ITEM, 125 mg. , Disp: , Rfl:  .  NONFORMULARY OR COMPOUNDED ITEM, SuperTrimix (30/2/20)-(Pap/Phent/PGE) Dosage: Inject 0.5- 1.0 (tirate up as needed)cc per injection Prefilled syringe 68m syringe Qty10 [phone] [phone] Disp: 10 each, Rfl: 11 .  polyethylene glycol powder (GLYCOLAX/MIRALAX) powder, TAKE 17 G BY MOUTH DAILY AS NEEDED FOR MILD CONSTIPATION., Disp: 527 g, Rfl: 1 .  Probiotic Product (PROBIOTIC ADVANCED) CAPS, Take by mouth., Disp: , Rfl:  .  Secukinumab (COSENTYX [place]), Inject 1 application into the skin every 30 (thirty) days., Disp: , Rfl:  .  escitalopram (LEXAPRO) 10 MG tablet, Take 1 tablet (10 mg total) by mouth daily., Disp: 90 tablet, Rfl: 1 .  omeprazole (PRILOSEC) 20 MG capsule, Take 1 capsule (20 mg total) by mouth daily., Disp: 90 capsule, Rfl: 1  EXAM:  VITALS per patient if applicable: None.  GENERAL: alert, oriented, appears well and in no acute distress  HEENT: atraumatic, conjunttiva clear, no obvious abnormalities on inspection of external nose and ears  NECK: normal movements of the head and neck  LUNGS: on inspection no signs of respiratory distress, breathing rate appears normal, no obvious gross SOB, gasping or wheezing  CV: no obvious cyanosis  MS: moves all visible extremities without noticeable abnormality  PSYCH/NEURO: pleasant and cooperative, no obvious depression or anxiety, speech and thought processing grossly intact  ASSESSMENT AND PLAN:  Discussed the following assessment and plan:  Prediabetes - Plan: Hemoglobin A1c  Essential hypertension - Plan: Lipid panel, Comp Met (CMET)  Obstructive sleep apnea  Anxiety  Constipation, unspecified constipation type  Gastroesophageal reflux disease, esophagitis presence not specified  Psoriatic arthritis [place]  Essential  hypertension Well-controlled.  Recent BP at rheumatology was adequately controlled.  He will continue amlodipine.  We will have him come in for labs in 1 month.  Obstructive sleep apnea Continue CPAP.  Difficult to tell if this is controlled adequately given that he has had some anxiety and sleep issues.  We will treat that and if not improving consider reevaluation of his CPAP.  Anxiety We will start on Lexapro.  He will follow-up in 2 months.  Constipation Stable.  Continue fiber and MiraLAX.  GERD (gastroesophageal reflux disease) Continue omeprazole.  Consider tapering down in the future.  [place]oriatic arthritis [place] Stable.  Continue management through rheumatology.  Prediabetes Check A1c.    I discussed the assessment and treatment plan with the patient. The patient was provided an opportunity to ask questions and all were answered. The patient agreed with the plan and demonstrated an understanding of the instructions.  The patient was advised to call back or seek an in-person evaluation if the symptoms worsen or if the condition fails to improve as anticipated.   Tommi Rumps, MD

## 2019-03-31 NOTE — Telephone Encounter (Signed)
That is fine. He should contact us around 2 months from now to let us know how the anxiety is doing.

## 2019-03-31 NOTE — Assessment & Plan Note (Signed)
Well-controlled.  Recent BP at rheumatology was adequately controlled.  He will continue amlodipine.  We will have him come in for labs in 1 month.

## 2019-03-31 NOTE — Assessment & Plan Note (Signed)
Continue omeprazole.  Consider tapering down in the future.

## 2019-03-31 NOTE — Assessment & Plan Note (Signed)
We will start on Lexapro.  He will follow-up in 2 months.

## 2019-03-31 NOTE — Assessment & Plan Note (Signed)
Stable.  Continue management through rheumatology.

## 2019-03-31 NOTE — Telephone Encounter (Signed)
Called and spoke with pt. Pt advised and voiced understanding.  

## 2019-03-31 NOTE — Assessment & Plan Note (Signed)
Continue CPAP.  Difficult to tell if this is controlled adequately given that he has had some anxiety and sleep issues.  We will treat that and if not improving consider reevaluation of his CPAP.

## 2019-05-01 ENCOUNTER — Other Ambulatory Visit: Payer: Self-pay

## 2019-05-01 ENCOUNTER — Other Ambulatory Visit (INDEPENDENT_AMBULATORY_CARE_PROVIDER_SITE_OTHER): Payer: Medicare Other

## 2019-05-01 DIAGNOSIS — I10 Essential (primary) hypertension: Secondary | ICD-10-CM

## 2019-05-01 DIAGNOSIS — R7303 Prediabetes: Secondary | ICD-10-CM | POA: Diagnosis not present

## 2019-05-01 LAB — LIPID PANEL
Cholesterol: 208 mg/dL — ABNORMAL HIGH (ref 0–200)
HDL: 77.1 mg/dL (ref >39.00)
LDL Cholesterol: 119 mg/dL — ABNORMAL HIGH (ref 0–99)
NonHDL: 130.96
Total CHOL/HDL Ratio: 3
Triglycerides: 58 mg/dL (ref 0.0–149.0)
VLDL: 11.6 mg/dL (ref 0.0–40.0)

## 2019-05-01 LAB — COMPREHENSIVE METABOLIC PANEL
ALT: 36 U/L (ref 0–53)
AST: 27 U/L (ref 0–37)
Albumin: 4.4 g/dL (ref 3.5–5.2)
Alkaline Phosphatase: 92 U/L (ref 39–117)
BUN: 11 mg/dL (ref 6–23)
CO2: 28 mEq/L (ref 19–32)
Calcium: 9.4 mg/dL (ref 8.4–10.5)
Chloride: 100 mEq/L (ref 96–112)
Creatinine, Ser: 0.85 mg/dL (ref 0.40–1.50)
GFR: 89.5 mL/min (ref >60.00)
Glucose, Bld: 121 mg/dL — ABNORMAL HIGH (ref 70–99)
Potassium: 4 mEq/L (ref 3.5–5.1)
Sodium: 138 mEq/L (ref 135–145)
Total Bilirubin: 1 mg/dL (ref 0.2–1.2)
Total Protein: 7 g/dL (ref 6.0–8.3)

## 2019-05-01 LAB — HEMOGLOBIN A1C: Hgb A1c MFr Bld: 6.6 % — ABNORMAL HIGH (ref 4.6–6.5)

## 2019-05-04 DIAGNOSIS — H353222 Exudative age-related macular degeneration, left eye, with inactive choroidal neovascularization: Secondary | ICD-10-CM | POA: Diagnosis not present

## 2019-05-04 DIAGNOSIS — H353211 Exudative age-related macular degeneration, right eye, with active choroidal neovascularization: Secondary | ICD-10-CM | POA: Diagnosis not present

## 2019-05-05 ENCOUNTER — Encounter: Payer: Self-pay | Admitting: Family Medicine

## 2019-05-06 MED ORDER — SERTRALINE HCL 50 MG PO TABS: 50.0000 mg | ORAL_TABLET | Freq: Every day | ORAL | 3 refills | Status: DC

## 2019-05-13 DIAGNOSIS — H40003 Preglaucoma, unspecified, bilateral: Secondary | ICD-10-CM | POA: Diagnosis not present

## 2019-05-26 DIAGNOSIS — L4050 Arthropathic psoriasis, unspecified: Secondary | ICD-10-CM | POA: Diagnosis not present

## 2019-05-26 DIAGNOSIS — Z79899 Other long term (current) drug therapy: Secondary | ICD-10-CM | POA: Diagnosis not present

## 2019-05-26 DIAGNOSIS — L409 Psoriasis, unspecified: Secondary | ICD-10-CM | POA: Diagnosis not present

## 2019-06-08 ENCOUNTER — Ambulatory Visit: Payer: Self-pay

## 2019-06-16 ENCOUNTER — Other Ambulatory Visit: Payer: Self-pay | Admitting: Family Medicine

## 2019-07-01 ENCOUNTER — Ambulatory Visit: Payer: Medicare Other | Admitting: Family Medicine

## 2019-07-18 ENCOUNTER — Other Ambulatory Visit: Payer: Self-pay | Admitting: Family Medicine

## 2019-07-21 ENCOUNTER — Encounter: Payer: Self-pay | Admitting: Family Medicine

## 2019-07-21 MED ORDER — FLUTICASONE PROPIONATE 50 MCG/ACT NA SUSP: 2.0000 | Freq: Every day | NASAL | 6 refills | Status: DC

## 2019-07-27 DIAGNOSIS — D3131 Benign neoplasm of right choroid: Secondary | ICD-10-CM | POA: Diagnosis not present

## 2019-07-27 DIAGNOSIS — H353211 Exudative age-related macular degeneration, right eye, with active choroidal neovascularization: Secondary | ICD-10-CM | POA: Diagnosis not present

## 2019-07-27 DIAGNOSIS — H353222 Exudative age-related macular degeneration, left eye, with inactive choroidal neovascularization: Secondary | ICD-10-CM | POA: Diagnosis not present

## 2019-08-14 ENCOUNTER — Encounter: Payer: Self-pay | Admitting: Family Medicine

## 2019-08-17 ENCOUNTER — Encounter: Payer: Self-pay | Admitting: Family Medicine

## 2019-08-21 ENCOUNTER — Encounter: Payer: Self-pay | Admitting: Urology

## 2019-09-24 DIAGNOSIS — Z23 Encounter for immunization: Secondary | ICD-10-CM | POA: Diagnosis not present

## 2019-10-16 ENCOUNTER — Other Ambulatory Visit: Payer: Self-pay

## 2019-10-19 DIAGNOSIS — H353222 Exudative age-related macular degeneration, left eye, with inactive choroidal neovascularization: Secondary | ICD-10-CM | POA: Diagnosis not present

## 2019-10-19 DIAGNOSIS — H353211 Exudative age-related macular degeneration, right eye, with active choroidal neovascularization: Secondary | ICD-10-CM | POA: Diagnosis not present

## 2019-10-26 ENCOUNTER — Encounter: Payer: Self-pay | Admitting: Family Medicine

## 2019-10-26 ENCOUNTER — Ambulatory Visit (INDEPENDENT_AMBULATORY_CARE_PROVIDER_SITE_OTHER): Payer: Medicare Other | Admitting: Family Medicine

## 2019-10-26 ENCOUNTER — Ambulatory Visit (INDEPENDENT_AMBULATORY_CARE_PROVIDER_SITE_OTHER): Payer: Medicare Other

## 2019-10-26 ENCOUNTER — Other Ambulatory Visit: Payer: Self-pay

## 2019-10-26 VITALS — Ht 72.0 in | Wt 255.0 lb

## 2019-10-26 DIAGNOSIS — I10 Essential (primary) hypertension: Secondary | ICD-10-CM

## 2019-10-26 DIAGNOSIS — Z0000 Encounter for general adult medical examination without abnormal findings: Secondary | ICD-10-CM

## 2019-10-26 DIAGNOSIS — E785 Hyperlipidemia, unspecified: Secondary | ICD-10-CM | POA: Diagnosis not present

## 2019-10-26 DIAGNOSIS — R7303 Prediabetes: Secondary | ICD-10-CM

## 2019-10-26 DIAGNOSIS — G4733 Obstructive sleep apnea (adult) (pediatric): Secondary | ICD-10-CM | POA: Diagnosis not present

## 2019-10-26 NOTE — Patient Instructions (Addendum)
  Wayne Scott , Thank you for taking time to come for your Medicare Wellness Visit. I appreciate your ongoing commitment to your health goals. Please review the following plan we discussed and let me know if I can assist you in the future.   These are the goals we discussed: Goals    . Weight 230-235lb     Portion control Increase lean proteins Low carb diet        This is a list of the screening recommended for you and due dates:  Health Maintenance  Topic Date Due  .  Hepatitis C: One time screening is recommended by Center for Disease Control  (CDC) for  adults born from [birth year] through 1966.   [birth year]  . Tetanus Vaccine  10/25/1969  . Cologuard (Stool DNA test)  10/31/2020  . Flu Shot  Completed  . Pneumonia vaccines  Addressed

## 2019-10-26 NOTE — Progress Notes (Signed)
Virtual Visit via telephone Note  This visit type was conducted due to national recommendations for restrictions regarding the COVID-19 pandemic (e.g. social distancing).  This format is felt to be most appropriate for this patient at this time.  All issues noted in this document were discussed and addressed.  No physical exam was performed (except for noted visual exam findings with Video Visits).   I connected with Wayne Scott today at 11:00 AM EST by telephone and verified that I am speaking with the correct person using two identifiers. Location patient: home Location provider: work Persons participating in the virtual visit: patient, provider  I discussed the limitations, risks, security and privacy concerns of performing an evaluation and management service by telephone and the availability of in person appointments. I also discussed with the patient that there may be a patient responsible charge related to this service. The patient expressed understanding and agreed to proceed.  Interactive audio and video telecommunications were attempted between this provider and patient, however failed, due to patient having technical difficulties OR patient did not have access to video capability.  We continued and completed visit with audio only.  Reason for visit: follow-up  HPI: HYPERTENSION  Disease Monitoring  Home BP Monitoring 120/80 Chest pain- no    Dyspnea- no Medications  Compliance-  Taking amlodipine.    OSA: Patient is wearing his CPAP nightly for at least 6 hours.  He does note some hypersomnia in the afternoon.  Sometimes he wakes up well rested and other times he does not.  Prediabetes/hyperlipidemia: No polydipsia.  He does note some polyuria though that is related to his prostate issues which he follows with urology for.  He has been walking 4 days a week and lifting some weights several days a week.  He tries to stay active.  He does report his brother had an MI.  Diet is  fairly healthy with vegetables and chicken and fish.  He has been working on diet and exercise for his cholesterol.    ROS: See pertinent positives and negatives per HPI.  Past Medical History:  Diagnosis Date  . Allergic rhinitis   . Arthritis   . GERD (gastroesophageal reflux disease)   . Headache   . Heart murmur   . Hypertension   . Macular degeneration   . OSA (obstructive sleep apnea)    uses CPAP  . Psoriasis     Past Surgical History:  Procedure Laterality Date  . CATARACT EXTRACTION    . EXTRACORPOREAL SHOCK WAVE LITHOTRIPSY Left 11/14/2017   Procedure: EXTRACORPOREAL SHOCK WAVE LITHOTRIPSY (ESWL);  Surgeon: Hollice Espy, MD;  Location: [facility] ORS;  Service: Urology;  Laterality: Left;  . NASAL SINUS SURGERY    . TONSILLECTOMY    . VASECTOMY      Family History  Problem Relation Age of Onset  . Neuropathy Mother   . Stroke Mother   . Macular degeneration Mother   . Diabetes Father   . Arthritis Other        Parent  . Hypertension Other        Parent  . Diabetes Other        Parent  . Heart attack Brother   . Kidney cancer Brother   . Bladder Cancer Neg Hx   . Prostate cancer Neg Hx     SOCIAL HX: Non-smoker   Current Outpatient Medications:  .  amLODipine (NORVASC) 10 MG tablet, TAKE 1 TABLET DAILY, Disp: 90 tablet, Rfl: 1 .  B Complex-C (SUPER B COMPLEX PO), Take by mouth., Disp: , Rfl:  .  cholecalciferol (VITAMIN D) 1000 units tablet, Take 1,000 Units by mouth daily., Disp: , Rfl:  .  fluticasone (FLONASE) 50 MCG/ACT nasal spray, Place 2 sprays into both nostrils daily., Disp: 16 g, Rfl: 6 .  mometasone (ELOCON) 0.1 % cream, APPLY TO AFFECTED AREA EVERY DAY (Patient not taking: Reported on 10/26/2019), Disp: 45 g, Rfl: 6 .  Multiple Vitamin (MULTIVITAMIN) capsule, Take 1 capsule by mouth daily., Disp: , Rfl:  .  naproxen sodium (ANAPROX) 220 MG tablet, Take 220 mg by mouth as needed., Disp: , Rfl:  .  NONFORMULARY OR COMPOUNDED ITEM, 125 mg. ,  Disp: , Rfl:  .  NONFORMULARY OR COMPOUNDED ITEM, SuperTrimix (30/2/20)-(Pap/Phent/PGE) Dosage: Inject 0.5- 1.0 (tirate up as needed)cc per injection Prefilled syringe 81ml syringe Qty10 [place] [phone] Fax [phone], Disp: 10 each, Rfl: 11 .  omeprazole (PRILOSEC) 20 MG capsule, TAKE 1 CAPSULE BY MOUTH EVERY DAY, Disp: 90 capsule, Rfl: 1 .  polyethylene glycol powder (GLYCOLAX/MIRALAX) powder, TAKE 17 G BY MOUTH DAILY AS NEEDED FOR MILD CONSTIPATION., Disp: 527 g, Rfl: 1 .  Probiotic Product (PROBIOTIC ADVANCED) CAPS, Take by mouth., Disp: , Rfl:  .  Secukinumab (COSENTYX [place]), Inject 1 application into the skin every 30 (thirty) days., Disp: , Rfl:   EXAM: This is a telehealth telephone visit and thus no physical exam was completed.  ASSESSMENT AND PLAN:  Discussed the following assessment and plan:  Essential hypertension At goal.  Continue current regimen.  He will come in for labs.  Obstructive sleep apnea Possibly uncontrolled.  We will request a compliance report and then determine if he needs follow-up CPAP titration.  Prediabetes Encouraged continued dietary changes and exercise.  Check labs.  Hyperlipidemia Discussed likely need for medication for his cholesterol.  We will recheck lab work and then determine need for this.  Discussed risk factor management given his brother's history of MI and his personal history of cholesterol issues and hypertension.  He wonders if he should have evaluation for coronary artery disease.  He has had no cardiovascular events.  I discussed that given that he has been asymptomatic and has had no cardiovascular events risk factor management is what is indicated at this time.    I discussed the assessment and treatment plan with the patient. The patient was provided an opportunity to ask questions and all were answered. The patient agreed with the plan and demonstrated an understanding of the instructions.   The patient  was advised to call back or seek an in-person evaluation if the symptoms worsen or if the condition fails to improve as anticipated.  I provided 23 minutes of non-face-to-face time during this encounter.   Tommi Rumps, MD

## 2019-10-26 NOTE — Progress Notes (Signed)
Subjective:   Wayne Scott is a 68 y.o. male who presents for Medicare Annual/Subsequent preventive examination.  Review of Systems:  No ROS.  Medicare Wellness Virtual Visit.  Visual/audio telehealth visit, UTA vital signs.   See social history for additional risk factors.   Cardiac Risk Factors include: advanced age (>61men, >39 women);hypertension     Objective:    Vitals: There were no vitals taken for this visit.  There is no height or weight on file to calculate BMI.  Advanced Directives 10/26/2019 06/04/2018  Does Patient Have a Medical Advance Directive? No No  Would patient like information on creating a medical advance directive? Yes (MAU/Ambulatory/Procedural Areas - Information given) No - Patient declined    Tobacco Social History   Tobacco Use  Smoking Status Never Smoker  Smokeless Tobacco Never Used     Counseling given: Not Answered   Clinical Intake:  Pre-visit preparation completed: Yes        Diabetes: No  How often do you need to have someone help you when you read instructions, pamphlets, or other written materials from your doctor or pharmacy?: 1 - Never  Interpreter Needed?: No     Past Medical History:  Diagnosis Date  . Allergic rhinitis   . Arthritis   . GERD (gastroesophageal reflux disease)   . Headache   . Heart murmur   . Hypertension   . Macular degeneration   . OSA (obstructive sleep apnea)    uses CPAP  . Psoriasis    Past Surgical History:  Procedure Laterality Date  . CATARACT EXTRACTION    . EXTRACORPOREAL SHOCK WAVE LITHOTRIPSY Left 11/14/2017   Procedure: EXTRACORPOREAL SHOCK WAVE LITHOTRIPSY (ESWL);  Surgeon: Hollice Espy, MD;  Location: [facility] ORS;  Service: Urology;  Laterality: Left;  . NASAL SINUS SURGERY    . TONSILLECTOMY    . VASECTOMY     Family History  Problem Relation Age of Onset  . Neuropathy Mother   . Stroke Mother   . Macular degeneration Mother   . Diabetes Father   . Arthritis  Other        Parent  . Hypertension Other        Parent  . Diabetes Other        Parent  . Heart attack Brother   . Kidney cancer Brother   . Bladder Cancer Neg Hx   . Prostate cancer Neg Hx    Social History   Socioeconomic History  . Marital status: Married    Spouse name: Not on file  . Number of children: Not on file  . Years of education: Not on file  . Highest education level: Not on file  Occupational History  . Not on file  Social Needs  . Financial resource strain: Not hard at all  . Food insecurity    Worry: Never true    Inability: Never true  . Transportation needs    Medical: No    Non-medical: No  Tobacco Use  . Smoking status: Never Smoker  . Smokeless tobacco: Never Used  Substance and Sexual Activity  . Alcohol use: Yes    Alcohol/week: 2.0 standard drinks    Types: 1 Cans of beer, 1 Shots of liquor per week    Comment: once or twice a month  . Drug use: No  . Sexual activity: Not on file  Lifestyle  . Physical activity    Days per week: 3 days    Minutes per session: 60  min  . Stress: Only a little  Relationships  . Social Herbalist on phone: Not on file    Gets together: Not on file    Attends religious service: Not on file    Active member of club or organization: Not on file    Attends meetings of clubs or organizations: Not on file    Relationship status: Not on file  Other Topics Concern  . Not on file  Social History Narrative  . Not on file    Outpatient Encounter Medications as of 10/26/2019  Medication Sig  . amLODipine (NORVASC) 10 MG tablet TAKE 1 TABLET DAILY  . B Complex-C (SUPER B COMPLEX PO) Take by mouth.  . cholecalciferol (VITAMIN D) 1000 units tablet Take 1,000 Units by mouth daily.  . fluticasone (FLONASE) 50 MCG/ACT nasal spray Place 2 sprays into both nostrils daily.  . Multiple Vitamin (MULTIVITAMIN) capsule Take 1 capsule by mouth daily.  . naproxen sodium (ANAPROX) 220 MG tablet Take 220 mg by mouth  as needed.  . NONFORMULARY OR COMPOUNDED ITEM 125 mg.   . NONFORMULARY OR COMPOUNDED ITEM SuperTrimix (30/2/20)-(Pap/Phent/PGE) Dosage: Inject 0.5- 1.0 (tirate up as needed)cc per injection Prefilled syringe 67ml syringe Qty10 [place] [phone] Fax [phone]  . omeprazole (PRILOSEC) 20 MG capsule TAKE 1 CAPSULE BY MOUTH EVERY DAY  . polyethylene glycol powder (GLYCOLAX/MIRALAX) powder TAKE 17 G BY MOUTH DAILY AS NEEDED FOR MILD CONSTIPATION.  . Probiotic Product (PROBIOTIC ADVANCED) CAPS Take by mouth.  . Secukinumab (COSENTYX ) Inject 1 application into the skin every 30 (thirty) days.  . mometasone (ELOCON) 0.1 % cream APPLY TO AFFECTED AREA EVERY DAY (Patient not taking: Reported on 10/26/2019)  . [DISCONTINUED] sertraline (ZOLOFT) 50 MG tablet Take 1 tablet (50 mg total) by mouth daily.   No facility-administered encounter medications on file as of 10/26/2019.     Activities of Daily Living In your present state of health, do you have any difficulty performing the following activities: 10/26/2019  Hearing? Y  Comment Hearing aids  Vision? N  Difficulty concentrating or making decisions? N  Walking or climbing stairs? N  Dressing or bathing? N  Doing errands, shopping? N  Preparing Food and eating ? N  Using the Toilet? N  In the past six months, have you accidently leaked urine? N  Do you have problems with loss of bowel control? N  Managing your Medications? N  Managing your Finances? N  Housekeeping or managing your Housekeeping? N  Some recent data might be hidden    Patient Care Team: Leone Haven, MD as PCP - General (Family Medicine)   Assessment:   This is a routine wellness examination for [name].  Nurse connected with patient 10/26/19 at  9:00 AM EST by a telephone enabled telemedicine application and verified that I am speaking with the correct person using two identifiers. Patient stated full name and DOB. Patient gave  permission to continue with virtual visit. Patient's location was at home and Nurse's location was at [place] office.   Health Maintenance Due: -Tdap- discussed; to be completed with doctor in visit or local pharmacy.   -Hep C screening- discussed   See completed HM at the end of note.   Eye: Visual acuity not assessed. Virtual visit. Wears corrective lenses. Followed by their ophthalmologist every 12 months.   Dental: Visits every 6 months.    Hearing: Hearing aids- yes  Safety:  Patient feels safe at home- yes  Patient does have smoke detectors at home- yes Patient does wear sunscreen or protective clothing when in direct sunlight - yes Patient does wear seat belt when in a moving vehicle - yes Patient drives- yes Adequate lighting in walkways free from debris- yes Grab bars and handrails used as appropriate- yes Ambulates with no assistive device Cell phone on person when ambulating outside of the home- yes  Social: Alcohol intake - yes      Smoking history- never   Smokers in home? none Illicit drug use? none  Depression: PHQ 2 &9 complete. See screening below. Denies irritability, anhedonia, sadness/tearfullness.  Stable.   Falls: See screening below.    Medication: Taking as directed and without issues.   Covid-19: Precautions and sickness symptoms discussed. Wears mask, social distancing, hand hygiene as appropriate.   Activities of Daily Living Patient denies needing assistance with: household chores, feeding themselves, getting from bed to chair, getting to the toilet, bathing/showering, dressing, managing money, or preparing meals.   Memory: Patient is alert. Patient denies difficulty focusing or concentrating. Correctly identified the president of the [place], season and recall. 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   BMI- discussed the importance of a healthy  diet, water intake and the benefits of aerobic exercise.  Educational material provided.  Physical activity- walking every other day, weights  Diet:  Regular Water: good intake Caffeine: 1-2 cups of coffee  Other Providers Patient Care Team: Leone Haven, MD as PCP - General (Family Medicine) Exercise Activities and Dietary recommendations Current Exercise Habits: Home exercise routine, Type of exercise: stretching;walking, Time (Minutes): 60, Frequency (Times/Week): 3, Weekly Exercise (Minutes/Week): 180, Intensity: Intense  Goals    . Weight 230-235lb     Portion control Increase lean proteins Low carb diet        Fall Risk Fall Risk  10/26/2019 06/04/2018 01/18/2017  Falls in the past year? 0 No No  Follow up Falls prevention discussed;Education provided - -   Timed Get Up and Go Performed: no, virtual visit  Depression Screen PHQ 2/9 Scores 10/26/2019 06/04/2018 01/18/2017  PHQ - 2 Score 0 0 0    Cognitive Function MMSE - Mini Mental State Exam 06/04/2018  Orientation to time 5  Orientation to Place 5  Registration 3  Attention/ Calculation 5  Recall 3  Language- name 2 objects 2  Language- repeat 1  Language- follow 3 step command 3  Language- read & follow direction 1  Write a sentence 1  Copy design 1  Total score 30     6CIT Screen 10/26/2019  What Year? 0 points  What month? 0 points  What time? 0 points  Count back from 20 0 points  Months in reverse 0 points  Repeat phrase 0 points  Total Score 0    Immunization History  Administered Date(s) Administered  . Influenza, High Dose Seasonal PF 09/24/2019  . Influenza,inj,Quad PF,6+ Mos 09/21/2015  . Influenza-Unspecified 12/04/2016  . Pneumococcal Conjugate-13 12/04/2016   Screening Tests Health Maintenance  Topic Date Due  . Hepatitis C Screening  [birth year]  . TETANUS/TDAP  10/25/1969  . Fecal DNA (Cologuard)  10/31/2020  . INFLUENZA VACCINE  Completed  . PNA vac Low Risk Adult   Addressed       Plan:   Keep all routine maintenance appointments.   Follow up with your doctor today @ 11:00  Medicare Attestation I have personally reviewed: The  patient's medical and social history Their use of alcohol, tobacco or illicit drugs Their current medications and supplements The patient's functional ability including ADLs,fall risks, home safety risks, cognitive, and hearing and visual impairment Diet and physical activities Evidence for depression   In addition, I have reviewed and discussed with patient certain preventive protocols, quality metrics, and best practice recommendations. A written personalized care plan for preventive services as well as general preventive health recommendations were provided to patient via mail.     Varney Biles, LPN  [id number]

## 2019-10-27 ENCOUNTER — Telehealth: Payer: Self-pay | Admitting: Family Medicine

## 2019-10-27 DIAGNOSIS — E785 Hyperlipidemia, unspecified: Secondary | ICD-10-CM | POA: Insufficient documentation

## 2019-10-27 NOTE — Assessment & Plan Note (Signed)
Discussed likely need for medication for his cholesterol.  We will recheck lab work and then determine need for this.  Discussed risk factor management given his brother's history of MI and his personal history of cholesterol issues and hypertension.  He wonders if he should have evaluation for coronary artery disease.  He has had no cardiovascular events.  I discussed that given that he has been asymptomatic and has had no cardiovascular events risk factor management is what is indicated at this time.

## 2019-10-27 NOTE — Assessment & Plan Note (Signed)
Encouraged continued dietary changes and exercise.  Check labs.

## 2019-10-27 NOTE — Telephone Encounter (Signed)
I called [facility] and they are faxing the compliance report for CPAP and I called and got the patient on schedule for labs in 2 weeks. Nina,cma

## 2019-10-27 NOTE — Assessment & Plan Note (Signed)
Possibly uncontrolled.  We will request a compliance report and then determine if he needs follow-up CPAP titration.

## 2019-10-27 NOTE — Progress Notes (Signed)
I have reviewed the above note and agree.  Eric Sonnenberg, M.D.  

## 2019-10-27 NOTE — Assessment & Plan Note (Signed)
At goal.  Continue current regimen.  He will come in for labs.

## 2019-10-27 NOTE — Telephone Encounter (Addendum)
Can you contact [facility] and request that they send [place] a compliance report for this patient CPAP?  Please also contact the patient and get him set up for lab work sometime in the next couple of weeks.  Thanks.

## 2019-11-11 ENCOUNTER — Other Ambulatory Visit: Payer: Self-pay | Admitting: Family Medicine

## 2019-11-12 ENCOUNTER — Other Ambulatory Visit (INDEPENDENT_AMBULATORY_CARE_PROVIDER_SITE_OTHER): Payer: Medicare Other

## 2019-11-12 ENCOUNTER — Other Ambulatory Visit: Payer: Self-pay

## 2019-11-12 DIAGNOSIS — I10 Essential (primary) hypertension: Secondary | ICD-10-CM | POA: Diagnosis not present

## 2019-11-12 DIAGNOSIS — R7303 Prediabetes: Secondary | ICD-10-CM

## 2019-11-12 DIAGNOSIS — E785 Hyperlipidemia, unspecified: Secondary | ICD-10-CM | POA: Diagnosis not present

## 2019-11-12 DIAGNOSIS — N402 Nodular prostate without lower urinary tract symptoms: Secondary | ICD-10-CM

## 2019-11-12 LAB — COMPREHENSIVE METABOLIC PANEL
ALT: 37 U/L (ref 0–53)
AST: 27 U/L (ref 0–37)
Albumin: 4.4 g/dL (ref 3.5–5.2)
Alkaline Phosphatase: 86 U/L (ref 39–117)
BUN: 10 mg/dL (ref 6–23)
CO2: 29 mEq/L (ref 19–32)
Calcium: 9.2 mg/dL (ref 8.4–10.5)
Chloride: 101 mEq/L (ref 96–112)
Creatinine, Ser: 0.79 mg/dL (ref 0.40–1.50)
GFR: 97.24 mL/min (ref >60.00)
Glucose, Bld: 132 mg/dL — ABNORMAL HIGH (ref 70–99)
Potassium: 3.7 mEq/L (ref 3.5–5.1)
Sodium: 139 mEq/L (ref 135–145)
Total Bilirubin: 1 mg/dL (ref 0.2–1.2)
Total Protein: 7.1 g/dL (ref 6.0–8.3)

## 2019-11-12 LAB — HEMOGLOBIN A1C: Hgb A1c MFr Bld: 6.4 % (ref 4.6–6.5)

## 2019-11-12 LAB — LDL CHOLESTEROL, DIRECT: Direct LDL: 123 mg/dL

## 2019-11-13 LAB — PSA: Prostate Specific Ag, Serum: 0.2 ng/mL (ref 0.0–4.0)

## 2019-11-19 ENCOUNTER — Other Ambulatory Visit: Payer: Self-pay | Admitting: Family Medicine

## 2019-11-19 DIAGNOSIS — E785 Hyperlipidemia, unspecified: Secondary | ICD-10-CM

## 2019-11-19 MED ORDER — ROSUVASTATIN CALCIUM 20 MG PO TABS: 20.0000 mg | ORAL_TABLET | Freq: Every day | ORAL | 3 refills | Status: DC

## 2019-11-22 ENCOUNTER — Encounter: Payer: Self-pay | Admitting: Family Medicine

## 2019-11-23 MED ORDER — ATORVASTATIN CALCIUM 40 MG PO TABS: 40.0000 mg | ORAL_TABLET | Freq: Every day | ORAL | 3 refills | Status: DC

## 2019-11-26 ENCOUNTER — Encounter: Payer: Self-pay | Admitting: Urology

## 2019-12-02 ENCOUNTER — Telehealth: Payer: Self-pay | Admitting: *Deleted

## 2019-12-02 NOTE — Telephone Encounter (Addendum)
Patient notified, voiced understanding.   ----- Message from Hollice Espy, MD sent at 12/01/2019  1:50 PM EST ----- I am not sure why your PSA got drawn a couple weeks ago but it looks great, stable at 0.2.  See at your follow-up in March.  metacarpal

## 2020-01-04 DIAGNOSIS — H40053 Ocular hypertension, bilateral: Secondary | ICD-10-CM | POA: Diagnosis not present

## 2020-01-10 ENCOUNTER — Encounter: Payer: Self-pay | Admitting: Family Medicine

## 2020-01-11 DIAGNOSIS — H353211 Exudative age-related macular degeneration, right eye, with active choroidal neovascularization: Secondary | ICD-10-CM | POA: Diagnosis not present

## 2020-01-11 DIAGNOSIS — H353222 Exudative age-related macular degeneration, left eye, with inactive choroidal neovascularization: Secondary | ICD-10-CM | POA: Diagnosis not present

## 2020-01-13 ENCOUNTER — Other Ambulatory Visit: Payer: Self-pay

## 2020-01-13 ENCOUNTER — Encounter: Payer: Self-pay | Admitting: Family Medicine

## 2020-01-13 ENCOUNTER — Ambulatory Visit (INDEPENDENT_AMBULATORY_CARE_PROVIDER_SITE_OTHER): Payer: Medicare Other | Admitting: Family Medicine

## 2020-01-13 DIAGNOSIS — H1131 Conjunctival hemorrhage, right eye: Secondary | ICD-10-CM

## 2020-01-13 DIAGNOSIS — R140 Abdominal distension (gaseous): Secondary | ICD-10-CM

## 2020-01-13 NOTE — Progress Notes (Signed)
Virtual Visit via video Note  This visit type was conducted due to national recommendations for restrictions regarding the COVID-19 pandemic (e.g. social distancing).  This format is felt to be most appropriate for this patient at this time.  All issues noted in this document were discussed and addressed.  No physical exam was performed (except for noted visual exam findings with Video Visits).   I connected with Wayne Scott today at  3:15 PM EST by a video enabled telemedicine application or telephone and verified that I am speaking with the correct person using two identifiers. Location patient: home Location provider: work  Persons participating in the virtual visit: patient, provider, Hamish Trayer (wife)  I discussed the limitations, risks, security and privacy concerns of performing an evaluation and management service by telephone and the availability of in person appointments. I also discussed with the patient that there may be a patient responsible charge related to this service. The patient expressed understanding and agreed to proceed.   Reason for visit: Same-day visit.  HPI: Stomach bloating: Patient notes this has been going on for about 2 weeks.  He has had lots of bloating and gas.  Some flatulence.  Typically occurs after he eats.  Once he eats it feels like there is a ball of lead in his stomach.  No sharp pain.  There is cramping.  He has been having issues with constipation for quite some time and has been using fiber and MiraLAX and prune juice.  He has loose bowel movements about once a day.  No blood in his stool.  Some nausea.  No vomiting.  No urinary issues.  He wonders if it started after starting Lipitor.  He notes he has cut out fiber, MiraLAX, and prune juice and his symptoms have improved over the last couple of days.  Subconjunctival hemorrhage: Patient noted to have this on exam.  He gets injections in his eyes through ophthalmology and notes he oftentimes  will have 1 of these after an injection.   ROS: See pertinent positives and negatives per HPI.  Past Medical History:  Diagnosis Date  . Allergic rhinitis   . Arthritis   . GERD (gastroesophageal reflux disease)   . Headache   . Heart murmur   . Hypertension   . Macular degeneration   . OSA (obstructive sleep apnea)    uses CPAP  . Psoriasis     Past Surgical History:  Procedure Laterality Date  . CATARACT EXTRACTION    . EXTRACORPOREAL SHOCK WAVE LITHOTRIPSY Left 11/14/2017   Procedure: EXTRACORPOREAL SHOCK WAVE LITHOTRIPSY (ESWL);  Surgeon: Hollice Espy, MD;  Location: [facility] ORS;  Service: Urology;  Laterality: Left;  . NASAL SINUS SURGERY    . TONSILLECTOMY    . VASECTOMY      Family History  Problem Relation Age of Onset  . Neuropathy Mother   . Stroke Mother   . Macular degeneration Mother   . Diabetes Father   . Arthritis Other        Parent  . Hypertension Other        Parent  . Diabetes Other        Parent  . Heart attack Brother   . Kidney cancer Brother   . Bladder Cancer Neg Hx   . Prostate cancer Neg Hx     SOCIAL HX: Non-smoker   Current Outpatient Medications:  .  amLODipine (NORVASC) 10 MG tablet, TAKE 1 TABLET DAILY, Disp: 90 tablet, Rfl: 1 .  atorvastatin (LIPITOR) 40 MG tablet, Take 1 tablet (40 mg total) by mouth daily., Disp: 90 tablet, Rfl: 3 .  B Complex-C (SUPER B COMPLEX PO), Take by mouth., Disp: , Rfl:  .  cholecalciferol (VITAMIN D) 1000 units tablet, Take 1,000 Units by mouth daily., Disp: , Rfl:  .  fluticasone (FLONASE) 50 MCG/ACT nasal spray, Place 2 sprays into both nostrils daily., Disp: 16 g, Rfl: 6 .  mometasone (ELOCON) 0.1 % cream, APPLY TO AFFECTED AREA EVERY DAY, Disp: 45 g, Rfl: 6 .  Multiple Vitamin (MULTIVITAMIN) capsule, Take 1 capsule by mouth daily., Disp: , Rfl:  .  naproxen sodium (ANAPROX) 220 MG tablet, Take 220 mg by mouth as needed., Disp: , Rfl:  .  NONFORMULARY OR COMPOUNDED ITEM, 125 mg. , Disp: , Rfl:    .  NONFORMULARY OR COMPOUNDED ITEM, SuperTrimix (30/2/20)-(Pap/Phent/PGE) Dosage: Inject 0.5- 1.0 (tirate up as needed)cc per injection Prefilled syringe 67ml syringe Qty10 [place] [phone] Fax [phone], Disp: 10 each, Rfl: 11 .  omeprazole (PRILOSEC) 20 MG capsule, TAKE 1 CAPSULE BY MOUTH EVERY DAY, Disp: 90 capsule, Rfl: 1 .  polyethylene glycol powder (GLYCOLAX/MIRALAX) powder, TAKE 17 G BY MOUTH DAILY AS NEEDED FOR MILD CONSTIPATION., Disp: 527 g, Rfl: 1 .  Probiotic Product (PROBIOTIC ADVANCED) CAPS, Take by mouth., Disp: , Rfl:  .  Secukinumab (COSENTYX [place]), Inject 1 application into the skin every 30 (thirty) days., Disp: , Rfl:   EXAM:  VITALS per patient if applicable:  GENERAL: alert, oriented, appears well and in no acute distress  HEENT: atraumatic, right-sided subconjunctival hemorrhage noted, no obvious abnormalities on inspection of external nose and ears  NECK: normal movements of the head and neck  LUNGS: on inspection no signs of respiratory distress, breathing rate appears normal, no obvious gross SOB, gasping or wheezing  CV: no obvious cyanosis  MS: moves all visible extremities without noticeable abnormality  PSYCH/NEURO: pleasant and cooperative, no obvious depression or anxiety, speech and thought processing grossly intact  ASSESSMENT AND PLAN:  Discussed the following assessment and plan:  Bloating Possibly related to overuse of fiber, MiraLAX, and prune juice.  Has improved with alteration of these things.  He will continue to monitor and if he does not continue to improve he will let us know and we can consider further evaluation.  He will trial FODMAP diet as well.  Subconjunctival hemorrhage Patient will monitor.  If any changes he will contact his ophthalmologist.   No orders of the defined types were placed in this encounter.   No orders of the defined types were placed in this encounter.    I discussed the  assessment and treatment plan with the patient. The patient was provided an opportunity to ask questions and all were answered. The patient agreed with the plan and demonstrated an understanding of the instructions.   The patient was advised to call back or seek an in-person evaluation if the symptoms worsen or if the condition fails to improve as anticipated.   Tommi Rumps, MD

## 2020-01-13 NOTE — Patient Instructions (Signed)
Low-FODMAP Eating Plan  FODMAPs (fermentable oligosaccharides, disaccharides, monosaccharides, and polyols) are sugars that are hard for some people to digest. A low-FODMAP eating plan may help some people who have bowel (intestinal) diseases to manage their symptoms. This meal plan can be complicated to follow. Work with a diet and nutrition specialist (dietitian) to make a low-FODMAP eating plan that is right for you. A dietitian can make sure that you get enough nutrition from this diet. What are tips for following this plan? Reading food labels  Check labels for hidden FODMAPs such as: ? High-fructose syrup. ? Honey. ? Agave. ? Natural fruit flavors. ? Onion or garlic powder.  Choose low-FODMAP foods that contain 3-4 grams of fiber per serving.  Check food labels for serving sizes. Eat only one serving at a time to make sure FODMAP levels stay low. Meal planning  Follow a low-FODMAP eating plan for up to 6 weeks, or as told by your health care provider or dietitian.  To follow the eating plan: 1. Eliminate high-FODMAP foods from your diet completely. 2. Gradually reintroduce high-FODMAP foods into your diet one at a time. Most people should wait a few days after introducing one high-FODMAP food before they introduce the next high-FODMAP food. Your dietitian can recommend how quickly you may reintroduce foods. 3. Keep a daily record of what you eat and drink, and make note of any symptoms that you have after eating. 4. Review your daily record with a dietitian regularly. Your dietitian can help you identify which foods you can eat and which foods you should avoid. General tips  Drink enough fluid each day to keep your urine pale yellow.  Avoid processed foods. These often have added sugar and may be high in FODMAPs.  Avoid most dairy products, whole grains, and sweeteners.  Work with a dietitian to make sure you get enough fiber in your diet. Recommended  foods Grains  Gluten-free grains, such as rice, oats, buckwheat, quinoa, corn, polenta, and millet. Gluten-free pasta, bread, or cereal. Rice noodles. Corn tortillas. Vegetables  Eggplant, zucchini, cucumber, peppers, green beans, Brussels sprouts, bean sprouts, lettuce, arugula, kale, Swiss chard, spinach, collard greens, bok choy, summer squash, potato, and tomato. Limited amounts of corn, carrot, and sweet potato. Green parts of scallions. Fruits  Bananas, oranges, lemons, limes, blueberries, raspberries, strawberries, grapes, cantaloupe, honeydew melon, kiwi, papaya, passion fruit, and pineapple. Limited amounts of dried cranberries, banana chips, and shredded coconut. Dairy  Lactose-free milk, yogurt, and kefir. Lactose-free cottage cheese and ice cream. Non-dairy milks, such as almond, coconut, hemp, and rice milk. Yogurts made of non-dairy milks. Limited amounts of goat cheese, brie, mozzarella, parmesan, swiss, and other hard cheeses. Meats and other protein foods  Unseasoned beef, pork, poultry, or fish. Eggs. Bacon. Tofu (firm) and tempeh. Limited amounts of nuts and seeds, such as almonds, walnuts, brazil nuts, pecans, peanuts, pumpkin seeds, chia seeds, and sunflower seeds. Fats and oils  Butter-free spreads. Vegetable oils, such as olive, canola, and sunflower oil. Seasoning and other foods  Artificial sweeteners with names that do not end in "ol" such as aspartame, saccharine, and stevia. Maple syrup, white table sugar, raw sugar, brown sugar, and molasses. Fresh basil, coriander, parsley, rosemary, and thyme. Beverages  Water and mineral water. Sugar-sweetened soft drinks. Small amounts of orange juice or cranberry juice. Black and green tea. Most dry wines. Coffee. This may not be a complete list of low-FODMAP foods. Talk with your dietitian for more information. Foods to avoid Grains  Wheat,   including kamut, durum, and semolina. Barley and bulgur. Couscous. Wheat-based  cereals. Wheat noodles, bread, crackers, and pastries. Vegetables  Chicory root, artichoke, asparagus, cabbage, snow peas, sugar snap peas, mushrooms, and cauliflower. Onions, garlic, leeks, and the white part of scallions. Fruits  Fresh, dried, and juiced forms of apple, pear, watermelon, peach, plum, cherries, apricots, blackberries, boysenberries, figs, nectarines, and mango. Avocado. Dairy  Milk, yogurt, ice cream, and soft cheese. Cream and sour cream. Milk-based sauces. Custard. Meats and other protein foods  Fried or fatty meat. Sausage. Cashews and pistachios. Soybeans, baked beans, black beans, chickpeas, kidney beans, fava beans, navy beans, lentils, and split peas. Seasoning and other foods  Any sugar-free gum or candy. Foods that contain artificial sweeteners such as sorbitol, mannitol, isomalt, or xylitol. Foods that contain honey, high-fructose corn syrup, or agave. Bouillon, vegetable stock, beef stock, and chicken stock. Garlic and onion powder. Condiments made with onion, such as hummus, chutney, pickles, relish, salad dressing, and salsa. Tomato paste. Beverages  Chicory-based drinks. Coffee substitutes. Chamomile tea. Fennel tea. Sweet or fortified wines such as port or sherry. Diet soft drinks made with isomalt, mannitol, maltitol, sorbitol, or xylitol. Apple, pear, and mango juice. Juices with high-fructose corn syrup. This may not be a complete list of high-FODMAP foods. Talk with your dietitian to discuss what dietary choices are best for you.  Summary  A low-FODMAP eating plan is a short-term diet that eliminates FODMAPs from your diet to help ease symptoms of certain bowel diseases.  The eating plan usually lasts up to 6 weeks. After that, high-FODMAP foods are restarted gradually, one at a time, so you can find out which may be causing symptoms.  A low-FODMAP eating plan can be complicated. It is best to work with a dietitian who has experience with this type of  plan. This information is not intended to replace advice given to you by your health care provider. Make sure you discuss any questions you have with your health care provider. Document Revised: 10/25/2017 Document Reviewed: 07/09/2017 Elsevier Patient Education  2020 Elsevier Inc.  

## 2020-01-15 DIAGNOSIS — R140 Abdominal distension (gaseous): Secondary | ICD-10-CM | POA: Insufficient documentation

## 2020-01-15 DIAGNOSIS — H1130 Conjunctival hemorrhage, unspecified eye: Secondary | ICD-10-CM | POA: Insufficient documentation

## 2020-01-15 NOTE — Assessment & Plan Note (Signed)
Patient will monitor.  If any changes he will contact his ophthalmologist.

## 2020-01-15 NOTE — Assessment & Plan Note (Addendum)
Possibly related to overuse of fiber, MiraLAX, and prune juice.  Has improved with alteration of these things.  He will continue to monitor and if he does not continue to improve he will let us know and we can consider further evaluation.  He will trial FODMAP diet as well.

## 2020-01-19 ENCOUNTER — Other Ambulatory Visit: Payer: Self-pay | Admitting: *Deleted

## 2020-01-19 DIAGNOSIS — N402 Nodular prostate without lower urinary tract symptoms: Secondary | ICD-10-CM

## 2020-01-20 ENCOUNTER — Other Ambulatory Visit: Payer: Self-pay

## 2020-01-20 ENCOUNTER — Other Ambulatory Visit: Payer: Medicare Other

## 2020-01-20 DIAGNOSIS — N402 Nodular prostate without lower urinary tract symptoms: Secondary | ICD-10-CM | POA: Diagnosis not present

## 2020-01-21 LAB — PSA: Prostate Specific Ag, Serum: 0.2 ng/mL (ref 0.0–4.0)

## 2020-01-26 NOTE — Progress Notes (Signed)
01/27/2020 12:55 PM   Sandy Salaam Torain 1951/02/15 [id number]  Referring provider: Leone Haven, MD [address] [place],  [place] [address]  Chief Complaint  Patient presents with  . prostate nodule    HPI: Wayne Scott is a 69 yo white M with a history of prostate nodule, erectile dysfunction, and personal history of kidney stones returns today for his annual urologic f/u.   Erectile dysfunction  He was previously using 125 units of Trimix #5 (papaverine 30 mg, phen 1mg , prost 10 mcg) which slowly became less effective requiring higher and higher doses.  He was transitioned to Super Trimix and he states it helped for a little while. He reports that he is using entire syringe and erection is not maintained for longer period of time. He would like to consider penile prosthesis.   He is concerned today about loss of penile length.  Has been doing a lot of reading about this online.  History of Stones He does have a personal history of nephrolithiasis. He underwent shockwave lithotripsy in December 2018 which was successful. This was his first and only stone episode.   He said no recurrent stone episodes, flank pain, or gross hematuria.  No recent imaging.    Denies any interval stone passage as over the past year.  Nodular prostate His most recent PSA was 0.2 ng/dL from 01/20/20 and remains stable.   He denies any urinary symptoms today. No dysuria, hematuria, frequency, or urgency. Takes no medications for his prostate.    PMH: Past Medical History:  Diagnosis Date  . Allergic rhinitis   . Arthritis   . GERD (gastroesophageal reflux disease)   . Headache   . Heart murmur   . Hypertension   . Macular degeneration   . OSA (obstructive sleep apnea)    uses CPAP  . Psoriasis     Surgical History: Past Surgical History:  Procedure Laterality Date  . CATARACT EXTRACTION    . EXTRACORPOREAL SHOCK WAVE LITHOTRIPSY Left 11/14/2017   Procedure:  EXTRACORPOREAL SHOCK WAVE LITHOTRIPSY (ESWL);  Surgeon: Hollice Espy, MD;  Location: [facility] ORS;  Service: Urology;  Laterality: Left;  . NASAL SINUS SURGERY    . TONSILLECTOMY    . VASECTOMY      Home Medications:  Allergies as of 01/27/2020   No Known Allergies     Medication List       Accurate as of January 27, 2020 12:55 PM. If you have any questions, ask your nurse or doctor.        STOP taking these medications   atorvastatin 40 MG tablet Commonly known as: LIPITOR Stopped by: Hollice Espy, MD     TAKE these medications   amLODipine 10 MG tablet Commonly known as: NORVASC TAKE 1 TABLET DAILY   cholecalciferol 1000 units tablet Commonly known as: VITAMIN D Take 1,000 Units by mouth daily.   COSENTYX [place] Inject 1 application into the skin every 30 (thirty) days.   fluticasone 50 MCG/ACT nasal spray Commonly known as: FLONASE Place 2 sprays into both nostrils daily.   mometasone 0.1 % cream Commonly known as: ELOCON APPLY TO AFFECTED AREA EVERY DAY   multivitamin capsule Take 1 capsule by mouth daily.   naproxen sodium 220 MG tablet Commonly known as: ALEVE Take 220 mg by mouth as needed.   NONFORMULARY OR COMPOUNDED ITEM 125 mg.   NONFORMULARY OR COMPOUNDED ITEM SuperTrimix (30/2/20)-(Pap/Phent/PGE) Dosage: Inject 0.5- 1.0 (tirate up as needed)cc per injection Prefilled  syringe 42ml syringe Qty10 [place] [phone] Fax [phone]   omeprazole 20 MG capsule Commonly known as: PRILOSEC TAKE 1 CAPSULE BY MOUTH EVERY DAY   polyethylene glycol powder 17 GM/SCOOP powder Commonly known as: GLYCOLAX/MIRALAX TAKE 17 G BY MOUTH DAILY AS NEEDED FOR MILD CONSTIPATION.   Probiotic Advanced Caps Take by mouth.   SUPER B COMPLEX PO Take by mouth.       Allergies: No Known Allergies  Family History: Family History  Problem Relation Age of Onset  . Neuropathy Mother   . Stroke Mother   . Macular degeneration Mother   .  Diabetes Father   . Arthritis Other        Parent  . Hypertension Other        Parent  . Diabetes Other        Parent  . Heart attack Brother   . Kidney cancer Brother   . Bladder Cancer Neg Hx   . Prostate cancer Neg Hx     Social History:  reports that he has never smoked. He has never used smokeless tobacco. He reports current alcohol use of about 2.0 standard drinks of alcohol per week. He reports that he does not use drugs.   Physical Exam: BP 131/82   Pulse 83   Ht 6' (1.829 m)   Wt 255 lb (115.7 kg)   BMI 34.58 kg/m   Constitutional:  Alert and oriented, No acute distress. HEENT: [place] AT, moist mucus membranes.  Trachea midline, no masses. Cardiovascular: No clubbing, cyanosis, or edema. Respiratory: Normal respiratory effort, no increased work of breathing. Rectal: Normal sphincter tone, Enlarged 50 cc prostate with hard nodule at apex, approximately 1.5 cm, stable from last year Skin: No rashes, bruises or suspicious lesions. Neurologic: Grossly intact, no focal deficits, moving all 4 extremities. Psychiatric: Normal mood and affect.  Assessment & Plan:    1. Erectile dysfunction  Pt not satisfied with Super Trimix  We discussed the risk and benefits of penile prosthesis along with the pathophysiology of ED - lengthy discussion today Gave implant model and educated/ provided with literature pt regarding penile prosthesis function Pt interested in penile prosthesis implant; Referred to Dr. Francesca Jewett for evaluation   2.History of stones  Asymptomatic  Return for KUB if symptoms reoccur   3. 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  Hollice Espy, MD  [facility] [address] [place], [place] [address] [phone]  I, Lucas Mallow, am acting as a scribe for Dr.  Hollice Espy,  I have reviewed the above documentation for accuracy and completeness, and I agree with the above.   Hollice Espy, MD  I spent 40 total minutes on the day of the encounter including pre-visit review of the medical record, face-to-face time with the patient, and post visit ordering of labs/imaging/tests.

## 2020-01-27 ENCOUNTER — Ambulatory Visit (INDEPENDENT_AMBULATORY_CARE_PROVIDER_SITE_OTHER): Payer: Medicare Other | Admitting: Urology

## 2020-01-27 ENCOUNTER — Other Ambulatory Visit: Payer: Self-pay

## 2020-01-27 VITALS — BP 131/82 | HR 83 | Ht 72.0 in | Wt 255.0 lb

## 2020-01-27 DIAGNOSIS — Z87442 Personal history of urinary calculi: Secondary | ICD-10-CM | POA: Diagnosis not present

## 2020-01-27 DIAGNOSIS — N529 Male erectile dysfunction, unspecified: Secondary | ICD-10-CM

## 2020-01-27 DIAGNOSIS — N402 Nodular prostate without lower urinary tract symptoms: Secondary | ICD-10-CM

## 2020-02-02 DIAGNOSIS — N529 Male erectile dysfunction, unspecified: Secondary | ICD-10-CM | POA: Diagnosis not present

## 2020-02-25 HISTORY — PX: PENILE PROSTHESIS IMPLANT: SHX240

## 2020-02-29 DIAGNOSIS — N529 Male erectile dysfunction, unspecified: Secondary | ICD-10-CM | POA: Diagnosis not present

## 2020-03-17 DIAGNOSIS — H6060 Unspecified chronic otitis externa, unspecified ear: Secondary | ICD-10-CM | POA: Diagnosis not present

## 2020-03-17 DIAGNOSIS — H6122 Impacted cerumen, left ear: Secondary | ICD-10-CM | POA: Diagnosis not present

## 2020-03-17 DIAGNOSIS — H903 Sensorineural hearing loss, bilateral: Secondary | ICD-10-CM | POA: Diagnosis not present

## 2020-04-04 DIAGNOSIS — H353211 Exudative age-related macular degeneration, right eye, with active choroidal neovascularization: Secondary | ICD-10-CM | POA: Diagnosis not present

## 2020-04-04 DIAGNOSIS — H353222 Exudative age-related macular degeneration, left eye, with inactive choroidal neovascularization: Secondary | ICD-10-CM | POA: Diagnosis not present

## 2020-04-06 DIAGNOSIS — Z20822 Contact with and (suspected) exposure to covid-19: Secondary | ICD-10-CM | POA: Diagnosis not present

## 2020-04-06 DIAGNOSIS — Z01812 Encounter for preprocedural laboratory examination: Secondary | ICD-10-CM | POA: Diagnosis not present

## 2020-04-08 DIAGNOSIS — M1990 Unspecified osteoarthritis, unspecified site: Secondary | ICD-10-CM | POA: Diagnosis not present

## 2020-04-08 DIAGNOSIS — M069 Rheumatoid arthritis, unspecified: Secondary | ICD-10-CM | POA: Diagnosis not present

## 2020-04-08 DIAGNOSIS — G4733 Obstructive sleep apnea (adult) (pediatric): Secondary | ICD-10-CM | POA: Diagnosis not present

## 2020-04-08 DIAGNOSIS — E785 Hyperlipidemia, unspecified: Secondary | ICD-10-CM | POA: Diagnosis not present

## 2020-04-08 DIAGNOSIS — N529 Male erectile dysfunction, unspecified: Secondary | ICD-10-CM | POA: Diagnosis not present

## 2020-04-08 DIAGNOSIS — K219 Gastro-esophageal reflux disease without esophagitis: Secondary | ICD-10-CM | POA: Diagnosis not present

## 2020-04-08 DIAGNOSIS — I10 Essential (primary) hypertension: Secondary | ICD-10-CM | POA: Diagnosis not present

## 2020-04-08 DIAGNOSIS — N5201 Erectile dysfunction due to arterial insufficiency: Secondary | ICD-10-CM | POA: Diagnosis not present

## 2020-04-08 DIAGNOSIS — R7303 Prediabetes: Secondary | ICD-10-CM | POA: Diagnosis not present

## 2020-04-08 DIAGNOSIS — F419 Anxiety disorder, unspecified: Secondary | ICD-10-CM | POA: Diagnosis not present

## 2020-04-08 DIAGNOSIS — E669 Obesity, unspecified: Secondary | ICD-10-CM | POA: Diagnosis not present

## 2020-04-15 DIAGNOSIS — N3000 Acute cystitis without hematuria: Secondary | ICD-10-CM | POA: Diagnosis not present

## 2020-04-15 DIAGNOSIS — N529 Male erectile dysfunction, unspecified: Secondary | ICD-10-CM | POA: Diagnosis not present

## 2020-04-20 DIAGNOSIS — Z09 Encounter for follow-up examination after completed treatment for conditions other than malignant neoplasm: Secondary | ICD-10-CM | POA: Diagnosis not present

## 2020-04-20 DIAGNOSIS — N529 Male erectile dysfunction, unspecified: Secondary | ICD-10-CM | POA: Diagnosis not present

## 2020-04-20 DIAGNOSIS — N3000 Acute cystitis without hematuria: Secondary | ICD-10-CM | POA: Diagnosis not present

## 2020-04-26 ENCOUNTER — Encounter: Payer: Self-pay | Admitting: Family Medicine

## 2020-04-26 ENCOUNTER — Ambulatory Visit (INDEPENDENT_AMBULATORY_CARE_PROVIDER_SITE_OTHER): Payer: Medicare Other | Admitting: Family Medicine

## 2020-04-26 ENCOUNTER — Other Ambulatory Visit: Payer: Self-pay

## 2020-04-26 DIAGNOSIS — E785 Hyperlipidemia, unspecified: Secondary | ICD-10-CM | POA: Diagnosis not present

## 2020-04-26 DIAGNOSIS — L4050 Arthropathic psoriasis, unspecified: Secondary | ICD-10-CM

## 2020-04-26 DIAGNOSIS — R7303 Prediabetes: Secondary | ICD-10-CM

## 2020-04-26 DIAGNOSIS — R42 Dizziness and giddiness: Secondary | ICD-10-CM | POA: Diagnosis not present

## 2020-04-26 DIAGNOSIS — K219 Gastro-esophageal reflux disease without esophagitis: Secondary | ICD-10-CM

## 2020-04-26 DIAGNOSIS — I10 Essential (primary) hypertension: Secondary | ICD-10-CM

## 2020-04-26 LAB — LIPID PANEL
Cholesterol: 214 mg/dL — ABNORMAL HIGH (ref 0–200)
HDL: 68.3 mg/dL (ref >39.00)
LDL Cholesterol: 130 mg/dL — ABNORMAL HIGH (ref 0–99)
NonHDL: 145.54
Total CHOL/HDL Ratio: 3
Triglycerides: 76 mg/dL (ref 0.0–149.0)
VLDL: 15.2 mg/dL (ref 0.0–40.0)

## 2020-04-26 LAB — COMPREHENSIVE METABOLIC PANEL
ALT: 40 U/L (ref 0–53)
AST: 28 U/L (ref 0–37)
Albumin: 4.3 g/dL (ref 3.5–5.2)
Alkaline Phosphatase: 103 U/L (ref 39–117)
BUN: 11 mg/dL (ref 6–23)
CO2: 28 mEq/L (ref 19–32)
Calcium: 9.4 mg/dL (ref 8.4–10.5)
Chloride: 99 mEq/L (ref 96–112)
Creatinine, Ser: 0.86 mg/dL (ref 0.40–1.50)
GFR: 88.04 mL/min (ref >60.00)
Glucose, Bld: 273 mg/dL — ABNORMAL HIGH (ref 70–99)
Potassium: 4.1 mEq/L (ref 3.5–5.1)
Sodium: 134 mEq/L — ABNORMAL LOW (ref 135–145)
Total Bilirubin: 1 mg/dL (ref 0.2–1.2)
Total Protein: 6.8 g/dL (ref 6.0–8.3)

## 2020-04-26 LAB — HEMOGLOBIN A1C: Hgb A1c MFr Bld: 6.6 % — ABNORMAL HIGH (ref 4.6–6.5)

## 2020-04-26 MED ORDER — AMLODIPINE BESYLATE 10 MG PO TABS: 5.0000 mg | ORAL_TABLET | Freq: Every day | ORAL | 1 refills | Status: DC

## 2020-04-26 MED ORDER — FLUTICASONE PROPIONATE 50 MCG/ACT NA SUSP: 2.0000 | Freq: Every day | NASAL | 6 refills | Status: DC

## 2020-04-26 MED ORDER — OMEPRAZOLE 20 MG PO CPDR: DELAYED_RELEASE_CAPSULE | ORAL | 1 refills | Status: DC

## 2020-04-26 NOTE — Patient Instructions (Signed)
Nice to see you. We are going to decrease your amlodipine to 5 mg a day for now while you are on the Flomax.  We will see you back in about a week to recheck.

## 2020-04-26 NOTE — Assessment & Plan Note (Signed)
Asymptomatic.  Continue omeprazole.

## 2020-04-26 NOTE — Assessment & Plan Note (Signed)
He will continue to see rheumatology.  Discussed he could continue Aleve.  Discussed risk of kidney injury and risk of ulcer/stomach irritation.  Advised to take with food.  Discussed we will periodically monitor his kidney function.  Discussed that his omeprazole is intact in the lining of his stomach as well.

## 2020-04-26 NOTE — Assessment & Plan Note (Addendum)
Suspect this is related to the Flomax.  Blood pressure does drop 10 points on diastolic testing.  We will have him decrease his amlodipine to 5 mg once daily and return in 1 week for a BP check.

## 2020-04-26 NOTE — Progress Notes (Signed)
Wayne Rumps, MD Phone: [phone]  Wayne Scott is a 69 y.o. male who presents today for f/u.  HYPERTENSION  Disease Monitoring  Home BP Monitoring 120/80 Chest pain- no    Dyspnea- no Medications  Compliance-  Taking amlodipine  Edema- rare that comes and goes  GERD:   Reflux symptoms: no   Abd pain: no   Blood in stool: no  Dysphagia: no   EGD: no  Medication: omeprazole. Has had GERD for at least 20 years. No family history of gastric cancer or esophageal cancer.   Lightheadedness: Patient notes he has been on Flomax after having a penile prosthesis placed and having difficulty urinating.  He notes he typically feels lightheaded when he moves around though he does feel lightheaded when sitting at times.  This started 1 week ago when he started on the Flomax.  He notes overall he is doing quite well with the penile implant and is healing well.  Psoriatic arthritis: Patient notes he is on Cosentyx and does not feel as though it helps very much with his joint pain.  He takes Aleve daily for this pain.   Social History   Tobacco Use  Smoking Status Never Smoker  Smokeless Tobacco Never Used     ROS see history of present illness  Objective  Physical Exam Vitals:   04/26/20 1102  BP: 139/82  Pulse: 87  Temp: 97.7 F (36.5 C)  SpO2: 97%   Laying blood pressure 140/90 pulse 78 Sitting blood pressure 130/80 pulse 91 Standing blood pressure 125/80 pulse 74  BP Readings from Last 3 Encounters:  04/26/20 139/82  01/27/20 131/82  01/27/19 (!) 146/8   Wt Readings from Last 3 Encounters:  04/26/20 264 lb 9.6 oz (120 kg)  01/27/20 255 lb (115.7 kg)  01/13/20 255 lb (115.7 kg)    Physical Exam Constitutional:      General: He is not in acute distress.    Appearance: He is not diaphoretic.  Cardiovascular:     Rate and Rhythm: Normal rate and regular rhythm.     Heart sounds: Normal heart sounds.  Pulmonary:     Effort: Pulmonary effort is normal.     Breath sounds: Normal breath sounds.  Abdominal:     General: Bowel sounds are normal. There is no distension.     Palpations: Abdomen is soft.     Tenderness: There is no abdominal tenderness. There is no guarding or rebound.  Musculoskeletal:     Right lower leg: No edema.     Left lower leg: No edema.  Skin:    General: Skin is warm and dry.  Neurological:     Mental Status: He is alert.      Assessment/Plan: Please see individual problem list.  Essential hypertension Well-controlled at home.  Suspect lightheadedness is related to Flomax.  Diastolic BP does drop on orthostatics.  We will have him decrease his amlodipine to 5 mg once daily and follow-up in 1 week for BP check.  GERD (gastroesophageal reflux disease) Asymptomatic.  Continue omeprazole.  Psoriatic arthritis [facility]) He will continue to see rheumatology.  Discussed he could continue Aleve.  Discussed risk of kidney injury and risk of ulcer/stomach irritation.  Advised to take with food.  Discussed we will periodically monitor his kidney function.  Discussed that his omeprazole is intact in the lining of his stomach as well.  Prediabetes Check A1c.  Lightheadedness Suspect this is related to the Flomax.  Blood pressure does drop 10  points on diastolic testing.  We will have him decrease his amlodipine to 5 mg once daily and return in 1 week for a BP check.  Hyperlipidemia Check lipid panel.  He had difficulty tolerating the statin last year relating to stomach cramps.   Orders Placed This Encounter  Procedures  . Comp Met (CMET)  . Lipid panel  . HgB A1c    Meds ordered this encounter  Medications  . omeprazole (PRILOSEC) 20 MG capsule    Sig: TAKE 1 CAPSULE BY MOUTH EVERY DAY    Dispense:  90 capsule    Refill:  1  . fluticasone (FLONASE) 50 MCG/ACT nasal spray    Sig: Place 2 sprays into both nostrils daily.    Dispense:  16 g    Refill:  6  . amLODipine (NORVASC) 10 MG tablet    Sig: Take 0.5  tablets (5 mg total) by mouth daily.    Dispense:  45 tablet    Refill:  1    This visit occurred during the SARS-CoV-2 public health emergency.  Safety protocols were in place, including screening questions prior to the visit, additional usage of staff PPE, and extensive cleaning of exam room while observing appropriate contact time as indicated for disinfecting solutions.    Wayne Rumps, MD [place]

## 2020-04-26 NOTE — Assessment & Plan Note (Signed)
Check A1c. 

## 2020-04-26 NOTE — Assessment & Plan Note (Signed)
Check lipid panel.  He had difficulty tolerating the statin last year relating to stomach cramps.

## 2020-04-26 NOTE — Assessment & Plan Note (Addendum)
Well-controlled at home.  Suspect lightheadedness is related to Flomax.  Diastolic BP does drop on orthostatics.  We will have him decrease his amlodipine to 5 mg once daily and follow-up in 1 week for BP check.

## 2020-04-28 ENCOUNTER — Other Ambulatory Visit: Payer: Self-pay | Admitting: Family Medicine

## 2020-04-28 DIAGNOSIS — E785 Hyperlipidemia, unspecified: Secondary | ICD-10-CM

## 2020-04-28 MED ORDER — PRAVASTATIN SODIUM 40 MG PO TABS: 40.0000 mg | ORAL_TABLET | Freq: Every day | ORAL | 1 refills | Status: DC

## 2020-05-02 ENCOUNTER — Encounter: Payer: Self-pay | Admitting: Family Medicine

## 2020-05-04 ENCOUNTER — Encounter: Payer: Self-pay | Admitting: Family Medicine

## 2020-05-04 ENCOUNTER — Other Ambulatory Visit: Payer: Self-pay | Admitting: Family Medicine

## 2020-05-04 ENCOUNTER — Other Ambulatory Visit: Payer: Self-pay

## 2020-05-04 ENCOUNTER — Ambulatory Visit (INDEPENDENT_AMBULATORY_CARE_PROVIDER_SITE_OTHER): Payer: Medicare Other | Admitting: Family Medicine

## 2020-05-04 DIAGNOSIS — I10 Essential (primary) hypertension: Secondary | ICD-10-CM

## 2020-05-04 DIAGNOSIS — R42 Dizziness and giddiness: Secondary | ICD-10-CM | POA: Diagnosis not present

## 2020-05-04 DIAGNOSIS — E119 Type 2 diabetes mellitus without complications: Secondary | ICD-10-CM | POA: Diagnosis not present

## 2020-05-04 MED ORDER — METFORMIN HCL 500 MG PO TABS
500.0000 mg | ORAL_TABLET | Freq: Two times a day (BID) | ORAL | 3 refills | Status: DC
Start: 2020-05-04 — End: 2020-12-21

## 2020-05-04 NOTE — Assessment & Plan Note (Addendum)
A1c in the diabetic range.  Will start on Metformin.  Discussed potential side effect of GI upset.  Advised to monitor for increased thirst or urination and contact us if those things occurred.

## 2020-05-04 NOTE — Patient Instructions (Signed)
Nice to see you. Please try the Metformin if you have any intolerance to it please let us know. We will see you back in 3 months.

## 2020-05-04 NOTE — Assessment & Plan Note (Addendum)
Adequate control.  Continue amlodipine.  Interestingly his blood pressure did go up after drinking fluids.  We will have him continue to monitor his blood pressure at home.

## 2020-05-04 NOTE — Assessment & Plan Note (Signed)
Related to Flomax.  This is resolved since discontinuing Flomax.

## 2020-05-04 NOTE — Progress Notes (Signed)
  Tommi Rumps, MD Phone: [phone]  Wayne Scott is a 69 y.o. male who presents today for f/u.  HYPERTENSION  Disease Monitoring  Home BP Monitoring similar to today, though notes it goes up about 10 points right after he drinks water Chest pain- no    Dyspnea- no Medications  Compliance-  Taking amlodipine 10 mg. Lightheadedness-  No, resolved with stopping the flomax. He got to the point that he could not stand the light headedness so he stopped the flomax and increased the amlodipine back to 10 mg.    Diabetes: Recent A1c in the diabetic range.  He has worked hard to decrease his carb intake.  He exercises 3-4 times a week.    Social History   Tobacco Use  Smoking Status Never Smoker  Smokeless Tobacco Never Used     ROS see history of present illness  Objective  Physical Exam Vitals:   05/04/20 1005  BP: 120/80  Pulse: 64  Temp: 97.9 F (36.6 C)  SpO2: 97%   I had the patient drink 16 ounces of water it we rechecked his blood pressure.  It went up to 140/90  BP Readings from Last 3 Encounters:  05/04/20 120/80  04/26/20 139/82  01/27/20 131/82   Wt Readings from Last 3 Encounters:  05/04/20 259 lb 3.2 oz (117.6 kg)  04/26/20 264 lb 9.6 oz (120 kg)  01/27/20 255 lb (115.7 kg)    Physical Exam Constitutional:      General: He is not in acute distress.    Appearance: He is not diaphoretic.  Cardiovascular:     Rate and Rhythm: Normal rate and regular rhythm.     Heart sounds: Normal heart sounds.  Pulmonary:     Effort: Pulmonary effort is normal.     Breath sounds: Normal breath sounds.  Skin:    General: Skin is warm and dry.  Neurological:     Mental Status: He is alert.      Assessment/Plan: Please see individual problem list.  Essential hypertension Adequate control.  Continue amlodipine.  Interestingly his blood pressure did go up after drinking fluids.  We will have him continue to monitor his blood pressure at home.  Diabetes  ([place]) A1c in the diabetic range.  Will start on Metformin.  Discussed potential side effect of GI upset.  Advised to monitor for increased thirst or urination and contact us if those things occurred.  Lightheadedness Related to Flomax.  This is resolved since discontinuing Flomax.   No orders of the defined types were placed in this encounter.   Meds ordered this encounter  Medications  . metFORMIN (GLUCOPHAGE) 500 MG tablet    Sig: Take 1 tablet (500 mg total) by mouth 2 (two) times daily with a meal.    Dispense:  180 tablet    Refill:  3    This visit occurred during the SARS-CoV-2 public health emergency.  Safety protocols were in place, including screening questions prior to the visit, additional usage of staff PPE, and extensive cleaning of exam room while observing appropriate contact time as indicated for disinfecting solutions.    Tommi Rumps, MD [place]

## 2020-05-05 DIAGNOSIS — Z09 Encounter for follow-up examination after completed treatment for conditions other than malignant neoplasm: Secondary | ICD-10-CM | POA: Diagnosis not present

## 2020-05-05 DIAGNOSIS — N529 Male erectile dysfunction, unspecified: Secondary | ICD-10-CM | POA: Diagnosis not present

## 2020-05-11 ENCOUNTER — Encounter: Payer: Self-pay | Admitting: Family Medicine

## 2020-06-20 DIAGNOSIS — H353211 Exudative age-related macular degeneration, right eye, with active choroidal neovascularization: Secondary | ICD-10-CM | POA: Diagnosis not present

## 2020-06-20 DIAGNOSIS — H353222 Exudative age-related macular degeneration, left eye, with inactive choroidal neovascularization: Secondary | ICD-10-CM | POA: Diagnosis not present

## 2020-07-11 DIAGNOSIS — H43813 Vitreous degeneration, bilateral: Secondary | ICD-10-CM | POA: Diagnosis not present

## 2020-07-27 DIAGNOSIS — I7100 Dissection of unspecified site of aorta: Secondary | ICD-10-CM

## 2020-07-27 HISTORY — DX: Dissection of unspecified site of aorta: I71.00

## 2020-08-08 DIAGNOSIS — Z23 Encounter for immunization: Secondary | ICD-10-CM | POA: Diagnosis not present

## 2020-08-10 ENCOUNTER — Ambulatory Visit
Admission: EM | Admit: 2020-08-10 | Discharge: 2020-08-10 | Disposition: A | Discharge status: Home / Self Care | Payer: Medicare Other | Attending: Emergency Medicine | Admitting: Emergency Medicine

## 2020-08-10 ENCOUNTER — Other Ambulatory Visit: Payer: Self-pay

## 2020-08-10 DIAGNOSIS — R9431 Abnormal electrocardiogram [ECG] [EKG]: Secondary | ICD-10-CM

## 2020-08-10 DIAGNOSIS — R0602 Shortness of breath: Secondary | ICD-10-CM

## 2020-08-10 DIAGNOSIS — I499 Cardiac arrhythmia, unspecified: Secondary | ICD-10-CM | POA: Diagnosis not present

## 2020-08-10 DIAGNOSIS — R079 Chest pain, unspecified: Secondary | ICD-10-CM

## 2020-08-10 DIAGNOSIS — R0789 Other chest pain: Secondary | ICD-10-CM | POA: Diagnosis not present

## 2020-08-10 NOTE — ED Notes (Signed)
Patient is being discharged from the Urgent Care and sent to the Emergency Department via ambulance . Per Barkley Boards, NP, patient is in need of higher level of care due to EKG changes, chest pain, and diaphoresis. Patient is aware and verbalizes understanding of plan of care.  Vitals:   08/10/20 0943  BP: (!) 148/69  Pulse: 64  Resp: 18  Temp: 98.7 F (37.1 C)  SpO2: 98%

## 2020-08-10 NOTE — ED Triage Notes (Signed)
Patient reports he got his COVID booster vaccine and his flu shot on Monday. Reports sudden onset chest pain last night accompanied with shortness of breath and diaphoresis.

## 2020-08-10 NOTE — ED Provider Notes (Signed)
Wayne Scott    CSN: [id number] Arrival date & time: 08/10/20  0935      History   Chief Complaint Chief Complaint  Patient presents with  . Chest Pain    HPI Wayne Scott is a 69 y.o. male.   Patient presents with midsternal chest pain since 9 PM last night (12 hours).  He reports associated shortness of breath.  He describes the pain as "pressure", 6/10, constant.  He denies nausea but reports diaphoresis during the night.  His medical history includes hypertension, heart murmur, psoriatic arthritis, OSA, macular degeneration, diabetes, anxiety.  The history is provided by the patient.    Past Medical History:  Diagnosis Date  . Allergic rhinitis   . Arthritis   . GERD (gastroesophageal reflux disease)   . Headache   . Heart murmur   . Hypertension   . Macular degeneration   . OSA (obstructive sleep apnea)    uses CPAP  . Psoriasis     Patient Active Problem List   Diagnosis Date Noted  . Bloating 01/15/2020  . Subconjunctival hemorrhage 01/15/2020  . Hyperlipidemia 10/27/2019  . Anxiety 03/31/2019  . Diabetes ([place]) 03/31/2019  . Ureteral calculus, left 11/05/2017  . Obesity (BMI 30-39.9) 10/23/2017  . Constipation 07/02/2017  . Sleeping difficulty 07/02/2017  . Obstructive sleep apnea 11/14/2015  . Psoriasis 11/14/2015  . Essential hypertension 09/19/2015  . Dyspnea 09/19/2015  . Lightheadedness 09/19/2015  . Psoriatic arthritis ([place]) 09/19/2015  . GERD (gastroesophageal reflux disease) 09/19/2015    Past Surgical History:  Procedure Laterality Date  . CATARACT EXTRACTION    . EXTRACORPOREAL SHOCK WAVE LITHOTRIPSY Left 11/14/2017   Procedure: EXTRACORPOREAL SHOCK WAVE LITHOTRIPSY (ESWL);  Surgeon: Hollice Espy, MD;  Location: [facility] ORS;  Service: Urology;  Laterality: Left;  . NASAL SINUS SURGERY    . TONSILLECTOMY    . VASECTOMY         Home Medications    Prior to Admission medications   Medication Sig Start Date End Date  Taking? Authorizing Provider  amLODipine (NORVASC) 10 MG tablet Take 0.5 tablets (5 mg total) by mouth daily. 04/26/20   Leone Haven, MD  B Complex-C (SUPER B COMPLEX PO) Take by mouth.    [provider]  cholecalciferol (VITAMIN D) 1000 units tablet Take 1,000 Units by mouth daily.    [provider]  fluticasone (FLONASE) 50 MCG/ACT nasal spray Place 2 sprays into both nostrils daily. 04/26/20   Leone Haven, MD  metFORMIN (GLUCOPHAGE) 500 MG tablet Take 1 tablet (500 mg total) by mouth 2 (two) times daily with a meal. 05/04/20   Leone Haven, MD  mometasone (ELOCON) 0.1 % cream APPLY TO AFFECTED AREA EVERY DAY 11/14/15   Leone Haven, MD  Multiple Vitamin (MULTIVITAMIN) capsule Take 1 capsule by mouth daily.    [provider]  naproxen sodium (ANAPROX) 220 MG tablet Take 220 mg by mouth as needed.    [provider]  NONFORMULARY OR COMPOUNDED ITEM 125 mg.     [provider]  NONFORMULARY OR COMPOUNDED ITEM SuperTrimix (30/2/20)-(Pap/Phent/PGE) Dosage: Inject 0.5- 1.0 (tirate up as needed)cc per injection Prefilled syringe 63ml syringe Qty10 [place] [phone] Fax [phone] 01/27/19   Hollice Espy, MD  omeprazole (PRILOSEC) 20 MG capsule TAKE 1 CAPSULE BY MOUTH EVERY DAY 05/04/20   Leone Haven, MD  polyethylene glycol powder (GLYCOLAX/MIRALAX) powder TAKE 17 G BY MOUTH DAILY AS NEEDED FOR MILD CONSTIPATION. 11/08/17  Leone Haven, MD  pravastatin (PRAVACHOL) 40 MG tablet Take 1 tablet (40 mg total) by mouth daily. 04/28/20   Leone Haven, MD  Probiotic Product (PROBIOTIC ADVANCED) CAPS Take by mouth.    [provider]  Secukinumab (COSENTYX [place]) Inject 1 application into the skin every 30 (thirty) days.    [provider]    Family History Family History  Problem Relation Age of Onset  . Neuropathy Mother   . Stroke Mother   . Macular degeneration Mother    . Diabetes Father   . Arthritis Other        Parent  . Hypertension Other        Parent  . Diabetes Other        Parent  . Heart attack Brother   . Kidney cancer Brother   . Bladder Cancer Neg Hx   . Prostate cancer Neg Hx     Social History Social History   Tobacco Use  . Smoking status: Never Smoker  . Smokeless tobacco: Never Used  Substance Use Topics  . Alcohol use: Yes    Alcohol/week: 2.0 standard drinks    Types: 1 Cans of beer, 1 Shots of liquor per week    Comment: once or twice a month  . Drug use: No     Allergies   Patient has no known allergies.   Review of Systems Review of Systems  Constitutional: Negative for chills and fever.  HENT: Negative for ear pain and sore throat.   Eyes: Negative for pain and visual disturbance.  Respiratory: Positive for shortness of breath. Negative for cough.   Cardiovascular: Positive for chest pain. Negative for palpitations.  Gastrointestinal: Negative for abdominal pain and vomiting.  Genitourinary: Negative for dysuria and hematuria.  Musculoskeletal: Negative for arthralgias and back pain.  Skin: Negative for color change and rash.  Neurological: Negative for seizures and syncope.  All other systems reviewed and are negative.    Physical Exam Triage Vital Signs ED Triage Vitals  Enc Vitals Group     BP      Pulse      Resp      Temp      Temp src      SpO2      Weight      Height      Head Circumference      Peak Flow      Pain Score      Pain Loc      Pain Edu?      Excl. in [place]?    No data found.  Updated Vital Signs BP (!) 148/69   Pulse 64   Temp 98.7 F (37.1 C)   Resp 18   SpO2 98%   Visual Acuity Right Eye Distance:   Left Eye Distance:   Bilateral Distance:    Right Eye Near:   Left Eye Near:    Bilateral Near:     Physical Exam Vitals and nursing note reviewed.  Constitutional:      General: He is not in acute distress.    Appearance: He is well-developed. He is  obese.  HENT:     Head: Normocephalic and atraumatic.  Eyes:     Conjunctiva/sclera: Conjunctivae normal.  Cardiovascular:     Rate and Rhythm: Normal rate and regular rhythm.     Heart sounds: Normal heart sounds. No murmur heard.   Pulmonary:     Effort: Pulmonary effort is normal. No  respiratory distress.     Breath sounds: Normal breath sounds. No wheezing or rhonchi.  Abdominal:     Palpations: Abdomen is soft.     Tenderness: There is no abdominal tenderness. There is no guarding or rebound.  Musculoskeletal:     Cervical back: Neck supple.     Right lower leg: No edema.     Left lower leg: No edema.  Skin:    General: Skin is warm and dry.     Findings: No rash.  Neurological:     General: No focal deficit present.     Mental Status: He is alert and oriented to person, place, and time.     Sensory: No sensory deficit.     Motor: No weakness.     Coordination: Coordination normal.     Gait: Gait normal.  Psychiatric:        Mood and Affect: Mood normal.        Behavior: Behavior normal.      UC Treatments / Results  Labs (all labs ordered are listed, but only abnormal results are displayed) Labs Reviewed - No data to display  EKG   Radiology No results found.  Procedures Procedures (including critical care time)  Medications Ordered in UC Medications - No data to display  Initial Impression / Assessment and Plan / UC Course  I have reviewed the triage vital signs and the nursing notes.  Pertinent labs & imaging results that were available during my care of the patient were reviewed by me and considered in my medical decision making (see chart for details).   Chest pain, shortness of breath.  EKG shows sinus rhythm, rate 64, slight ST depression in V3, V4, V5.  Sending to ED via EMS.      Final Clinical Impressions(s) / UC Diagnoses   Final diagnoses:  Chest pain, unspecified type  Shortness of breath  Abnormal EKG     Discharge Instructions      To ED via EMS    ED Prescriptions    None     PDMP not reviewed this encounter.   Sharion Balloon, NP 08/10/20 [phone]

## 2020-08-10 NOTE — Discharge Instructions (Signed)
To ED via EMS 

## 2020-08-11 ENCOUNTER — Emergency Department: Payer: Medicare Other

## 2020-08-11 ENCOUNTER — Encounter: Payer: Self-pay | Admitting: Radiology

## 2020-08-11 ENCOUNTER — Other Ambulatory Visit: Payer: Self-pay

## 2020-08-11 ENCOUNTER — Emergency Department
Admission: EM | Admit: 2020-08-11 | Discharge: 2020-08-11 | Disposition: A | Discharge status: Other Acute Inpatient Hospital | Payer: Medicare Other | Attending: Emergency Medicine | Admitting: Emergency Medicine

## 2020-08-11 DIAGNOSIS — Z20822 Contact with and (suspected) exposure to covid-19: Secondary | ICD-10-CM | POA: Diagnosis not present

## 2020-08-11 DIAGNOSIS — I7101 Dissection of thoracic aorta: Secondary | ICD-10-CM | POA: Insufficient documentation

## 2020-08-11 DIAGNOSIS — I4891 Unspecified atrial fibrillation: Secondary | ICD-10-CM | POA: Diagnosis not present

## 2020-08-11 DIAGNOSIS — Z79899 Other long term (current) drug therapy: Secondary | ICD-10-CM | POA: Diagnosis not present

## 2020-08-11 DIAGNOSIS — Z7902 Long term (current) use of antithrombotics/antiplatelets: Secondary | ICD-10-CM | POA: Diagnosis not present

## 2020-08-11 DIAGNOSIS — I719 Aortic aneurysm of unspecified site, without rupture: Secondary | ICD-10-CM | POA: Diagnosis not present

## 2020-08-11 DIAGNOSIS — Z8679 Personal history of other diseases of the circulatory system: Secondary | ICD-10-CM | POA: Diagnosis not present

## 2020-08-11 DIAGNOSIS — I7100 Dissection of unspecified site of aorta: Secondary | ICD-10-CM | POA: Diagnosis not present

## 2020-08-11 DIAGNOSIS — E119 Type 2 diabetes mellitus without complications: Secondary | ICD-10-CM | POA: Diagnosis not present

## 2020-08-11 DIAGNOSIS — I71019 Dissection of thoracic aorta, unspecified: Secondary | ICD-10-CM

## 2020-08-11 DIAGNOSIS — J982 Interstitial emphysema: Secondary | ICD-10-CM | POA: Diagnosis not present

## 2020-08-11 DIAGNOSIS — N179 Acute kidney failure, unspecified: Secondary | ICD-10-CM | POA: Diagnosis not present

## 2020-08-11 DIAGNOSIS — J9811 Atelectasis: Secondary | ICD-10-CM | POA: Diagnosis not present

## 2020-08-11 DIAGNOSIS — D72829 Elevated white blood cell count, unspecified: Secondary | ICD-10-CM | POA: Diagnosis not present

## 2020-08-11 DIAGNOSIS — L4050 Arthropathic psoriasis, unspecified: Secondary | ICD-10-CM | POA: Diagnosis present

## 2020-08-11 DIAGNOSIS — D696 Thrombocytopenia, unspecified: Secondary | ICD-10-CM | POA: Diagnosis present

## 2020-08-11 DIAGNOSIS — G4733 Obstructive sleep apnea (adult) (pediatric): Secondary | ICD-10-CM | POA: Diagnosis not present

## 2020-08-11 DIAGNOSIS — R918 Other nonspecific abnormal finding of lung field: Secondary | ICD-10-CM | POA: Diagnosis not present

## 2020-08-11 DIAGNOSIS — J811 Chronic pulmonary edema: Secondary | ICD-10-CM | POA: Diagnosis not present

## 2020-08-11 DIAGNOSIS — R079 Chest pain, unspecified: Secondary | ICD-10-CM | POA: Diagnosis not present

## 2020-08-11 DIAGNOSIS — I10 Essential (primary) hypertension: Secondary | ICD-10-CM | POA: Insufficient documentation

## 2020-08-11 DIAGNOSIS — E872 Acidosis: Secondary | ICD-10-CM | POA: Diagnosis not present

## 2020-08-11 DIAGNOSIS — R0602 Shortness of breath: Secondary | ICD-10-CM | POA: Diagnosis not present

## 2020-08-11 DIAGNOSIS — D62 Acute posthemorrhagic anemia: Secondary | ICD-10-CM | POA: Diagnosis not present

## 2020-08-11 DIAGNOSIS — Z4682 Encounter for fitting and adjustment of non-vascular catheter: Secondary | ICD-10-CM | POA: Diagnosis not present

## 2020-08-11 DIAGNOSIS — J90 Pleural effusion, not elsewhere classified: Secondary | ICD-10-CM | POA: Diagnosis not present

## 2020-08-11 DIAGNOSIS — Z87442 Personal history of urinary calculi: Secondary | ICD-10-CM | POA: Diagnosis not present

## 2020-08-11 DIAGNOSIS — Z7984 Long term (current) use of oral hypoglycemic drugs: Secondary | ICD-10-CM | POA: Diagnosis not present

## 2020-08-11 DIAGNOSIS — N990 Postprocedural (acute) (chronic) kidney failure: Secondary | ICD-10-CM | POA: Diagnosis not present

## 2020-08-11 DIAGNOSIS — E1165 Type 2 diabetes mellitus with hyperglycemia: Secondary | ICD-10-CM | POA: Diagnosis present

## 2020-08-11 DIAGNOSIS — Z960 Presence of urogenital implants: Secondary | ICD-10-CM | POA: Diagnosis not present

## 2020-08-11 DIAGNOSIS — I9789 Other postprocedural complications and disorders of the circulatory system, not elsewhere classified: Secondary | ICD-10-CM | POA: Diagnosis not present

## 2020-08-11 DIAGNOSIS — E876 Hypokalemia: Secondary | ICD-10-CM | POA: Diagnosis not present

## 2020-08-11 DIAGNOSIS — Z9889 Other specified postprocedural states: Secondary | ICD-10-CM | POA: Diagnosis not present

## 2020-08-11 DIAGNOSIS — Z9911 Dependence on respirator [ventilator] status: Secondary | ICD-10-CM | POA: Diagnosis not present

## 2020-08-11 HISTORY — DX: Type 2 diabetes mellitus without complications: E11.9

## 2020-08-11 HISTORY — PX: OTHER SURGICAL HISTORY: SHX169

## 2020-08-11 LAB — COMPREHENSIVE METABOLIC PANEL
ALT: 29 U/L (ref 0–44)
AST: 26 U/L (ref 15–41)
Albumin: 3.9 g/dL (ref 3.5–5.0)
Alkaline Phosphatase: 71 U/L (ref 38–126)
Anion gap: 13 (ref 5–15)
BUN: 16 mg/dL (ref 8–23)
CO2: 22 mmol/L (ref 22–32)
Calcium: 8.7 mg/dL — ABNORMAL LOW (ref 8.9–10.3)
Chloride: 101 mmol/L (ref 98–111)
Creatinine, Ser: 0.79 mg/dL (ref 0.61–1.24)
GFR calc Af Amer: >60 mL/min (ref >60)
GFR calc non Af Amer: >60 mL/min (ref >60)
Glucose, Bld: 148 mg/dL — ABNORMAL HIGH (ref 70–99)
Potassium: 3.4 mmol/L — ABNORMAL LOW (ref 3.5–5.1)
Sodium: 136 mmol/L (ref 135–145)
Total Bilirubin: 2.4 mg/dL — ABNORMAL HIGH (ref 0.3–1.2)
Total Protein: 7.1 g/dL (ref 6.5–8.1)

## 2020-08-11 LAB — TROPONIN I (HIGH SENSITIVITY)
Troponin I (High Sensitivity): 46 ng/L — ABNORMAL HIGH (ref <18)
Troponin I (High Sensitivity): 82 ng/L — ABNORMAL HIGH (ref <18)

## 2020-08-11 LAB — CBC
HCT: 42.3 % (ref 39.0–52.0)
Hemoglobin: 15.3 g/dL (ref 13.0–17.0)
MCH: 32.1 pg (ref 26.0–34.0)
MCHC: 36.2 g/dL — ABNORMAL HIGH (ref 30.0–36.0)
MCV: 88.9 fL (ref 80.0–100.0)
Platelets: 127 10*3/uL — ABNORMAL LOW (ref 150–400)
RBC: 4.76 MIL/uL (ref 4.22–5.81)
RDW: 12.3 % (ref 11.5–15.5)
WBC: 15.1 10*3/uL — ABNORMAL HIGH (ref 4.0–10.5)
nRBC: 0 % (ref 0.0–0.2)

## 2020-08-11 LAB — SARS CORONAVIRUS 2 BY RT PCR (HOSPITAL ORDER, PERFORMED IN ~~LOC~~ HOSPITAL LAB): SARS Coronavirus 2: NEGATIVE

## 2020-08-11 IMAGING — CT CT ANGIO CHEST
2 of 12 series · 11 of 46 positions shown · IV contrast (APPLIED)
Comparison: None.

CLINICAL DATA: Chest pain

EXAM:
CT ANGIOGRAPHY CHEST WITH CONTRAST
TECHNIQUE: Multidetector CT imaging of the chest was performed using the
standard protocol during bolus administration of intravenous
contrast. Multiplanar CT image reconstructions and MIPs were
obtained to evaluate the vascular anatomy.
CONTRAST:  75mL OMNIPAQUE IOHEXOL 350 MG/ML SOLN

[Series 5: thins · axial · 0.74mm/px · z∈[-14,+202]mm · 10 of 266 slices shown]
[im 25/266  lung]
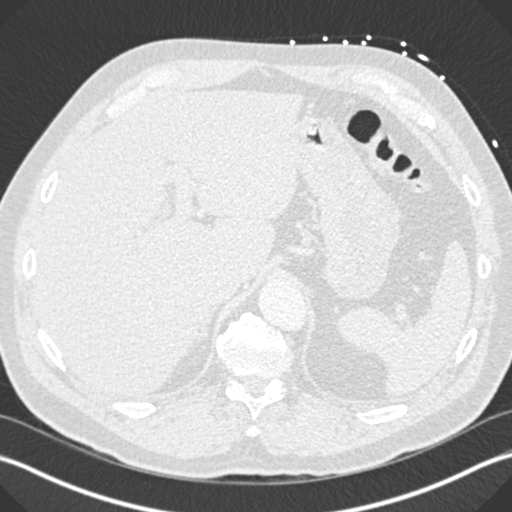
[im 49/266  soft-tissue]
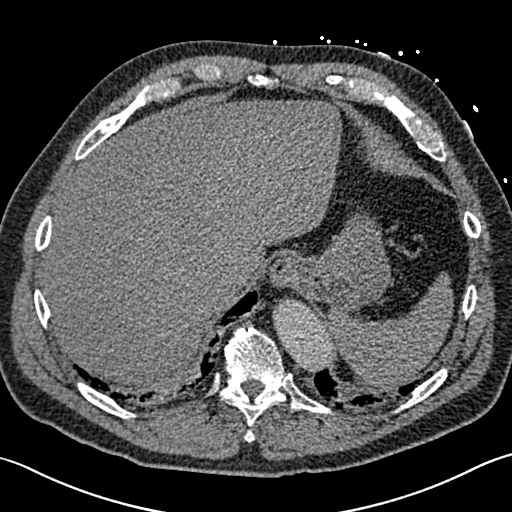
[im 73/266  lung]
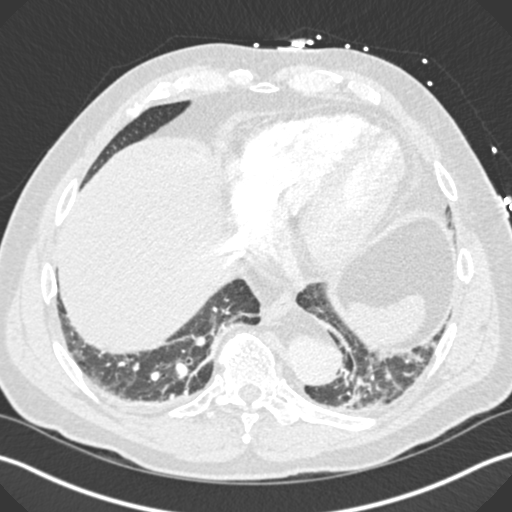
[im 97/266  soft-tissue]
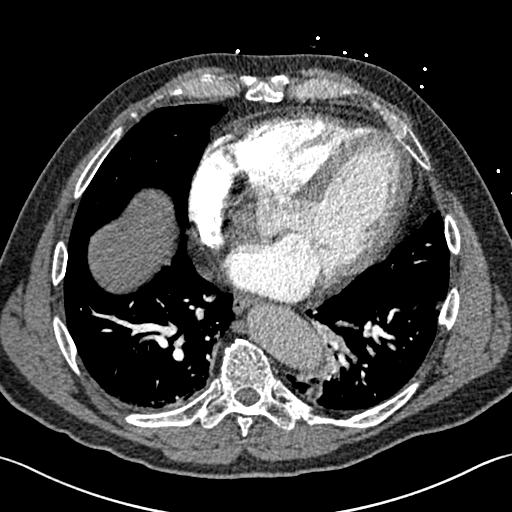
[im 121/266  lung]
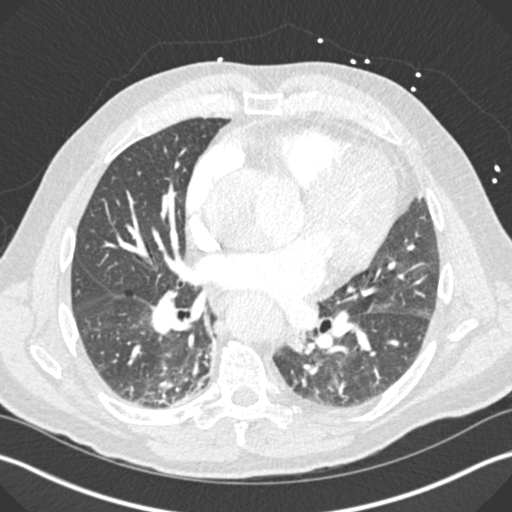
[im 145/266  soft-tissue]
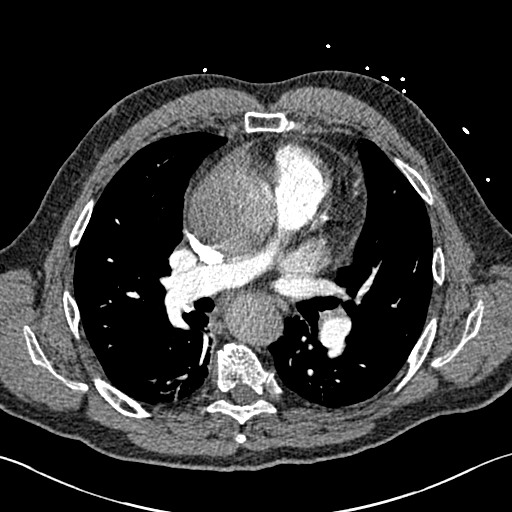
[im 169/266  lung]
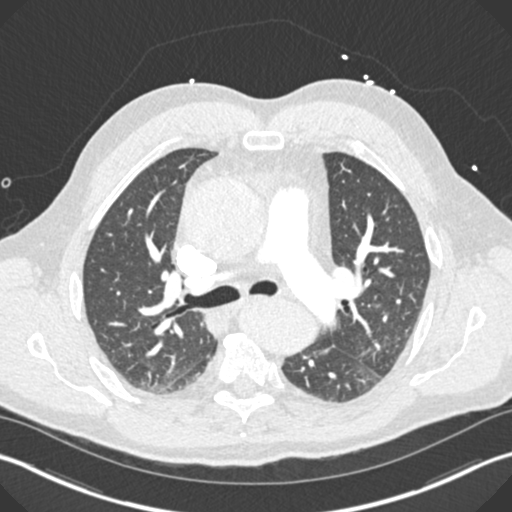
[im 193/266  soft-tissue]
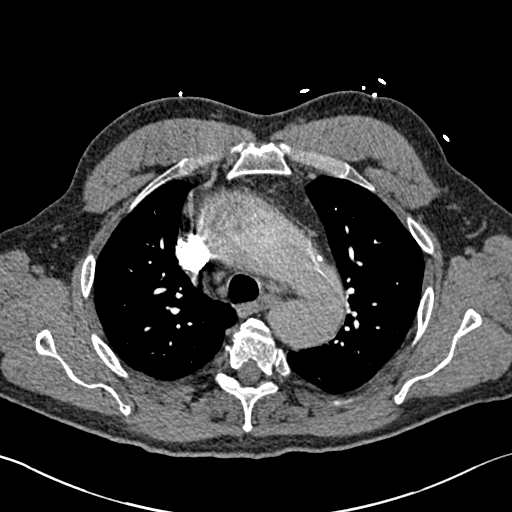
[im 217/266  lung]
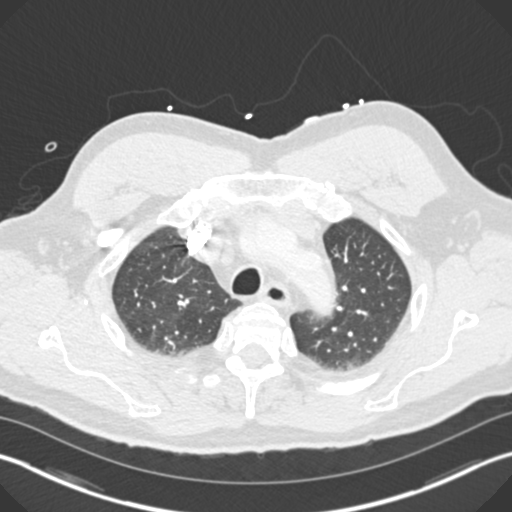
[im 241/266  soft-tissue]
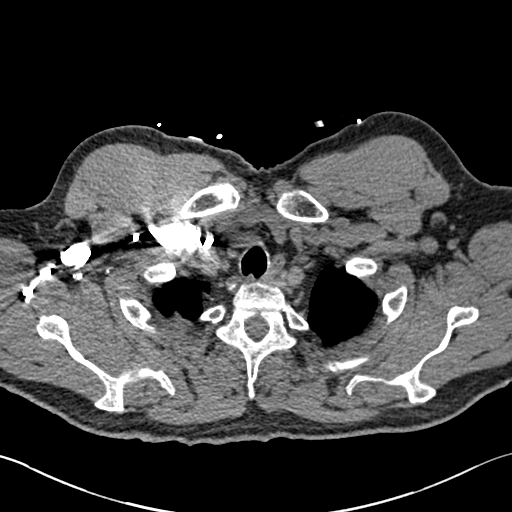

[Series 7: coronal mpr · coronal · 0.56mm/px · 1 of 106 slices shown]
[im 53/106  soft-tissue]
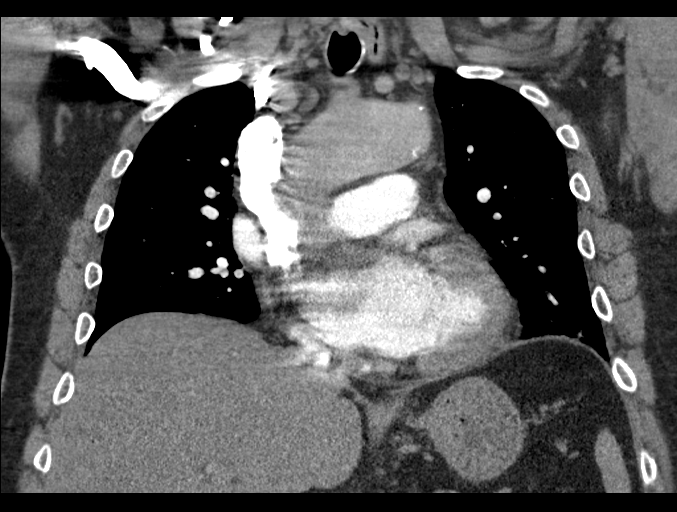

[11 of 46 positions shown; findings below may reference images not displayed]

FINDINGS: Cardiovascular: Fusiform aneurysmal enlargement of the aorta
measuring up to 5.8 cm at the ascending segment. There is an aortic
dissection with less intense enhancing false lumen which extends
from the lateral aspect of the valve to at least the mid descending
segment. No mediastinal hematoma or pericardial effusion. Borderline
heart size enlargement. Coronary atherosclerosis. The study is 2
motion degraded to detect present enhancement within the main
coronaries. Flap over rides the great vessels which are
symmetrically enhancing on the second acquired phase. The second
phase shows the true lumen to be relatively collapsed compared to
the false lumen.

Mediastinum/Nodes: Negative for adenopathy or mass.

Lungs/Pleura: Atelectasis at the lung bases. There is no edema,
consolidation, effusion, or pneumothorax.

Upper Abdomen: Partially covered cystic density associated with the
upper pole left kidney.

Musculoskeletal: No acute or aggressive finding

Critical Value/emergent results were called by telephone at the time
of interpretation on [DATE] at [DATE] to provider MASE
, who verbally acknowledged these results.

Review of the MIP images confirms the above findings.
IMPRESSION: Aortic dissection extending from the valve to at least the mid
descending aorta. Aneurysmal proximal aorta measuring up to 5.8 cm
at the ascending segment. No rupture or pericardial effusion.

## 2020-08-11 IMAGING — CR DG CHEST 2V
1 series · 2 of 2 positions shown · non-contrast
Comparison: None.

CLINICAL DATA: Shortness of breath

EXAM:
CHEST - 2 VIEW

[Series 1: dg chest 2 view · 0.14mm/px · 2 of 2 slices shown]
[im 1/2]
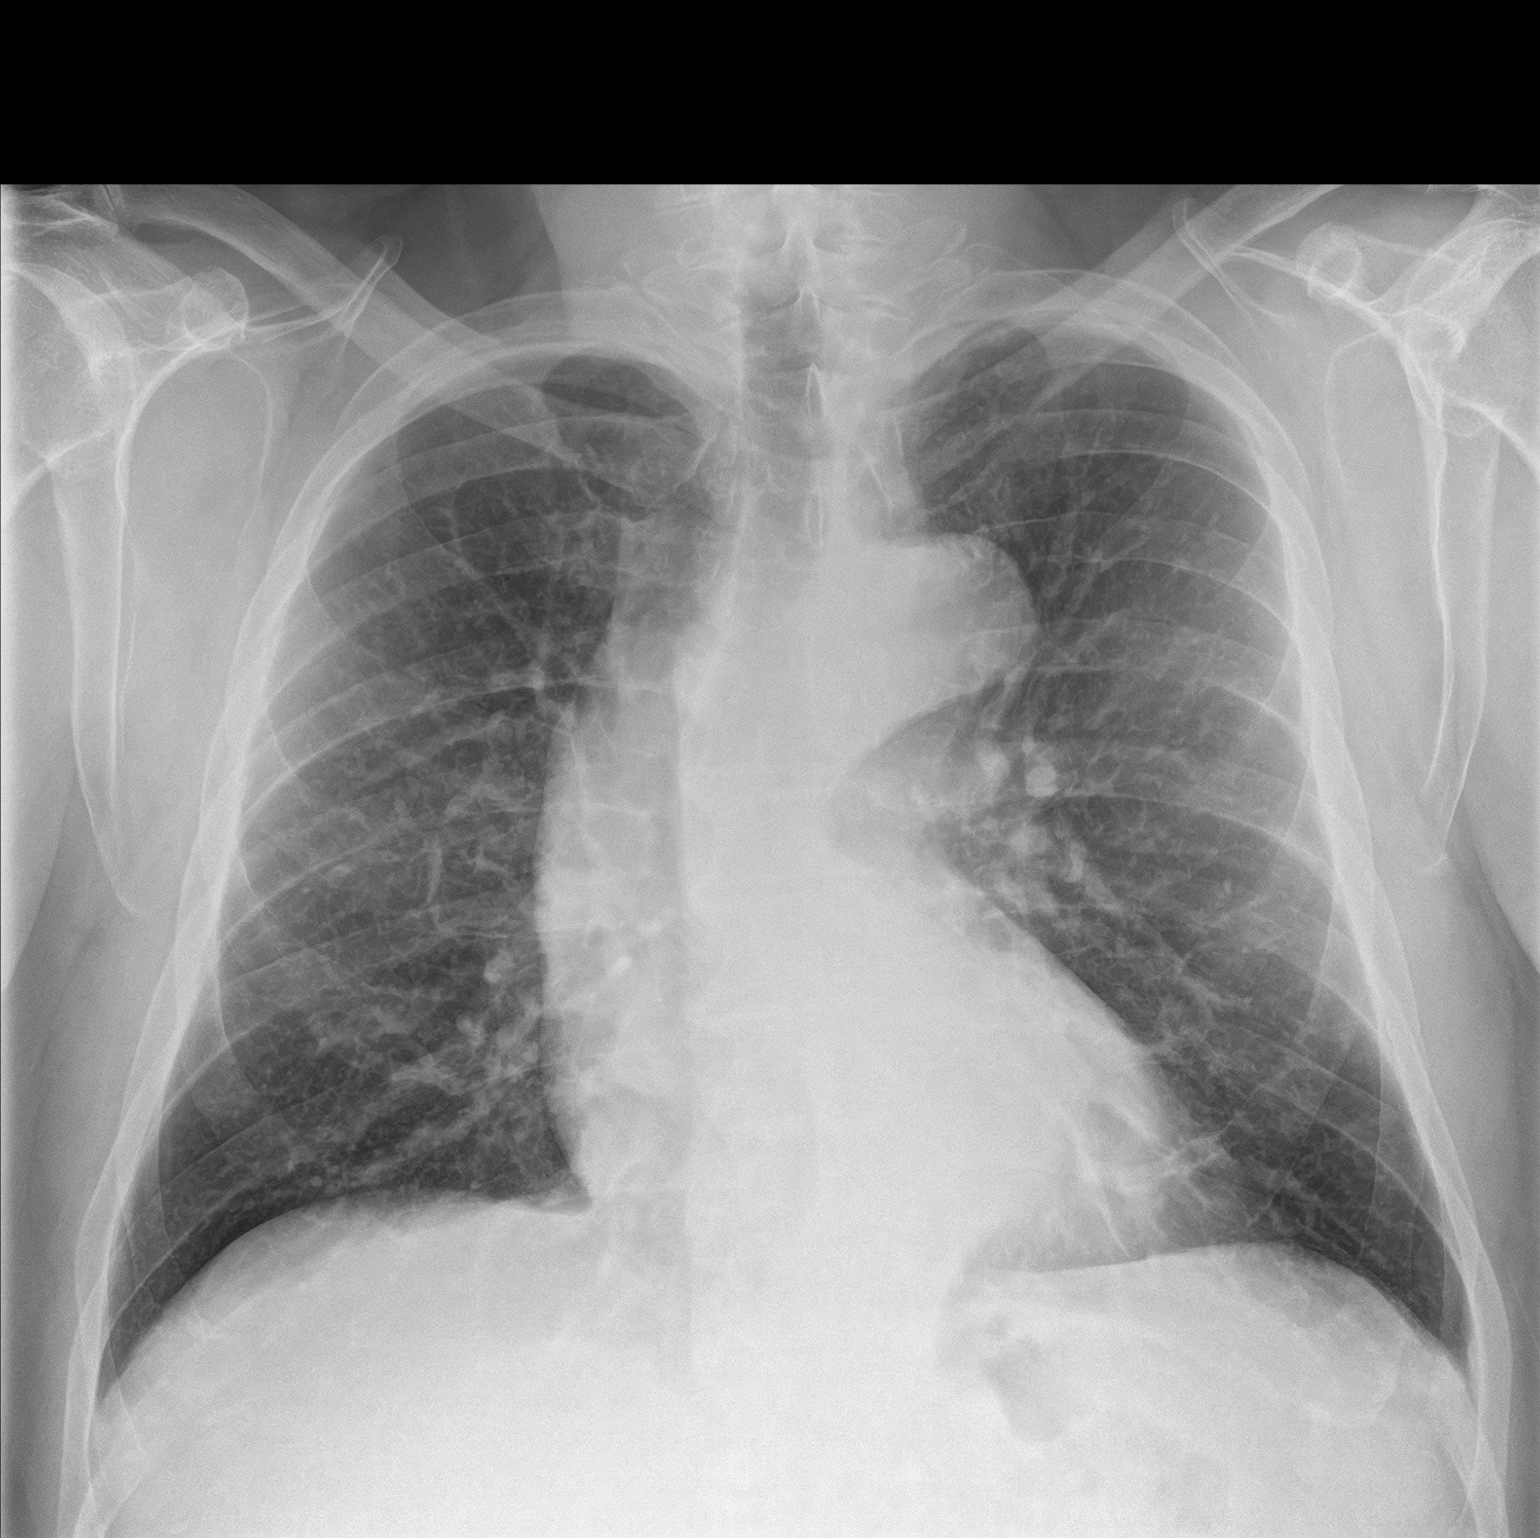
[im 2/2]
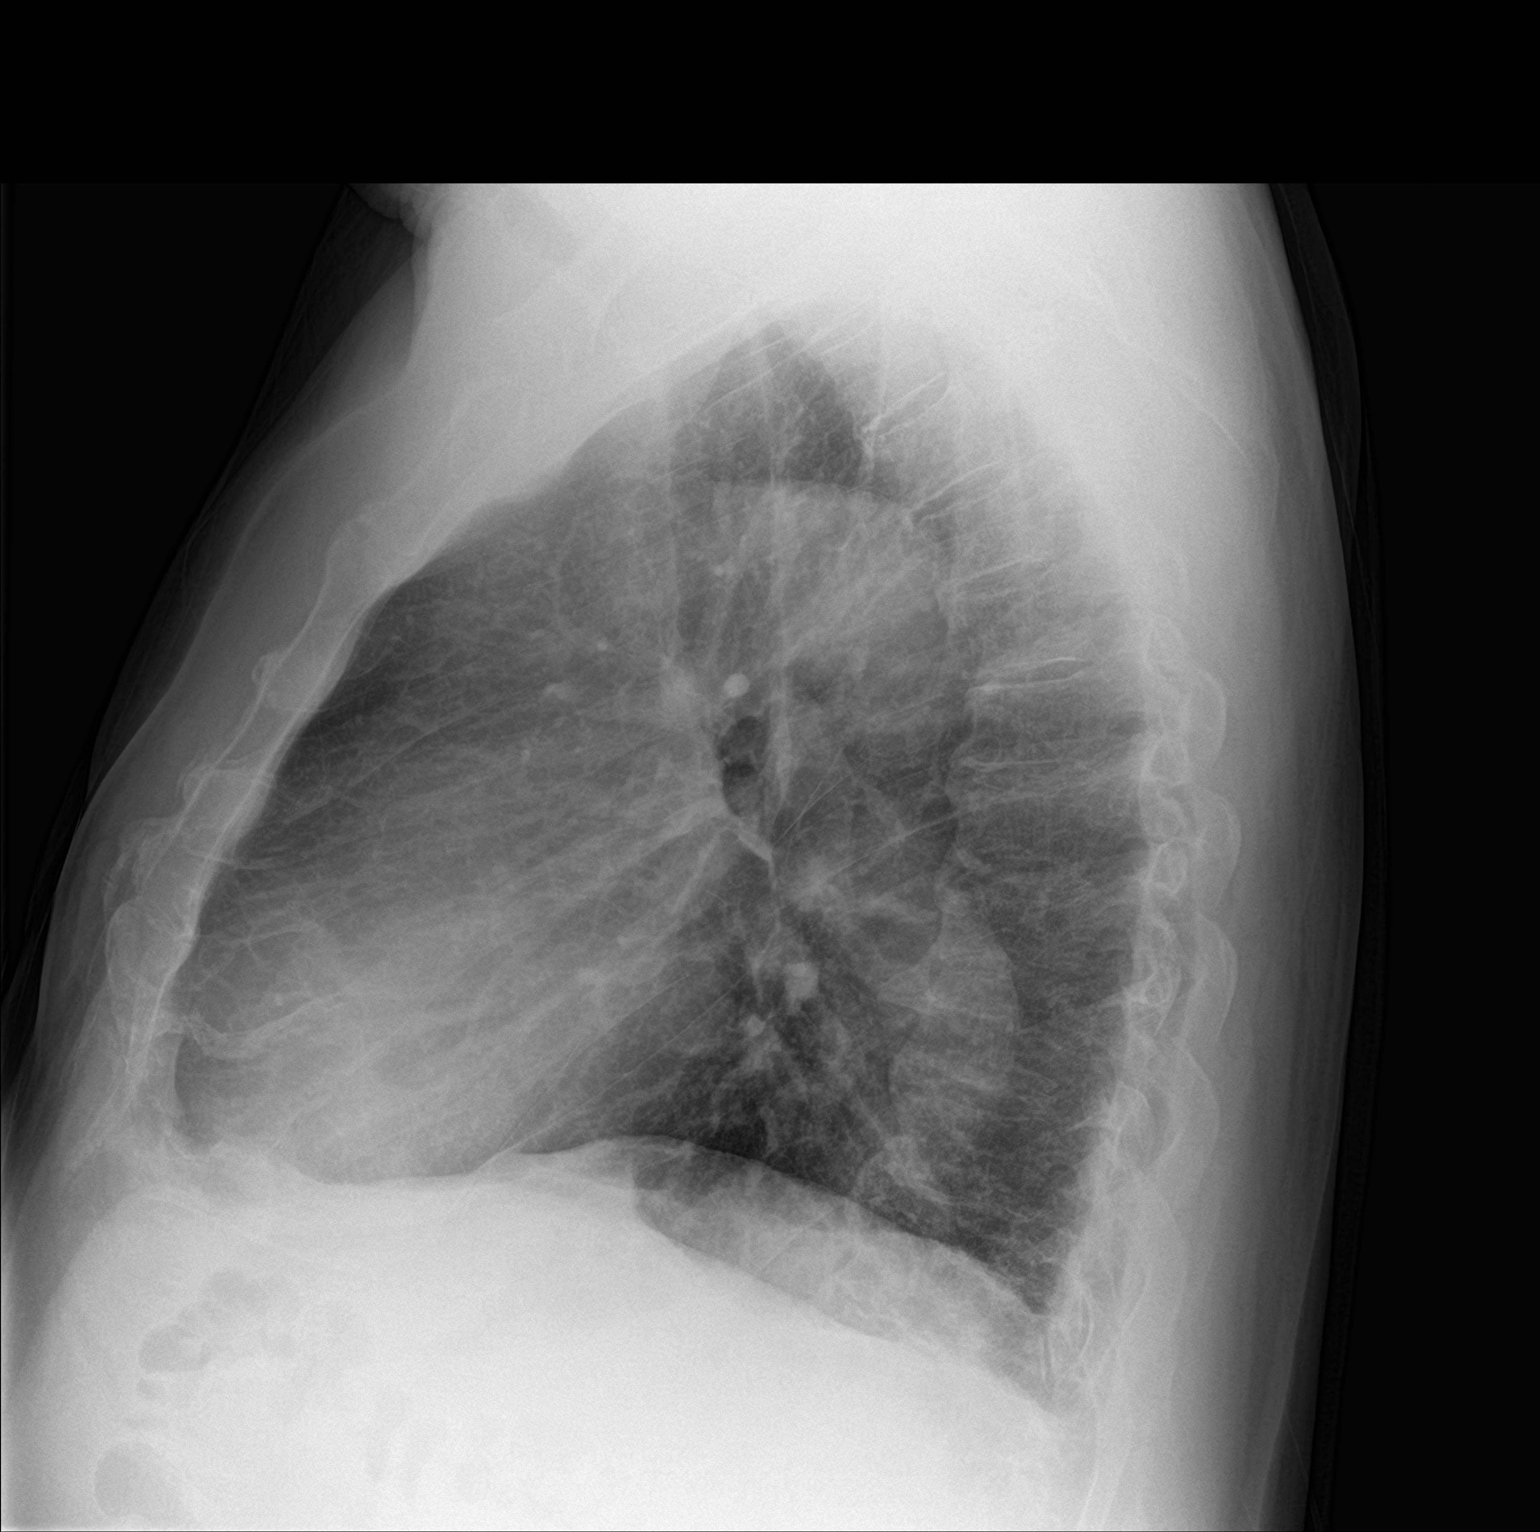

[2 of 2 positions shown; findings below may reference images not displayed]

FINDINGS: Normal heart size. Aortic tortuosity. There is no edema,
consolidation, effusion, or pneumothorax. Degenerative endplate
spurring.
IMPRESSION: 1. No acute finding.
2. Tortuous aorta.
3.

## 2020-08-11 MED ORDER — MORPHINE SULFATE (PF) 4 MG/ML IV SOLN
4.0000 mg | Freq: Once | INTRAVENOUS | Status: AC
  Administered 2020-08-11: 4 mg via INTRAVENOUS
  Filled 2020-08-11: qty 1

## 2020-08-11 MED ORDER — IOHEXOL 350 MG/ML SOLN
75.0000 mL | Freq: Once | INTRAVENOUS | Status: AC | PRN
  Administered 2020-08-11: 75 mL via INTRAVENOUS

## 2020-08-11 MED ORDER — ESMOLOL HCL-SODIUM CHLORIDE 2000 MG/100ML IV SOLN
25.0000 ug/kg/min | INTRAVENOUS | Status: DC
  Administered 2020-08-11: 25 ug/kg/min via INTRAVENOUS
  Filled 2020-08-11: qty 100

## 2020-08-11 MED ORDER — ONDANSETRON HCL 4 MG/2ML IJ SOLN
4.0000 mg | Freq: Once | INTRAMUSCULAR | Status: AC
  Administered 2020-08-11: 4 mg via INTRAVENOUS
  Filled 2020-08-11: qty 2

## 2020-08-11 MED ORDER — NITROPRUSSIDE SODIUM 25 MG/ML IV SOLN
0.0000 ug/kg/min | INTRAVENOUS | Status: DC
  Filled 2020-08-11: qty 2

## 2020-08-11 MED ORDER — NITROPRUSSIDE SODIUM-NACL 20-0.9 MG/100ML-% IV SOLN
0.0000 ug/kg/min | INTRAVENOUS | Status: DC
  Filled 2020-08-11: qty 100

## 2020-08-11 NOTE — ED Notes (Signed)
Wife at bedside.

## 2020-08-11 NOTE — ED Notes (Signed)
Pharmacy called to get esmolol sent ASAP

## 2020-08-11 NOTE — ED Notes (Signed)
Date and time results received: 08/11/20 11:51 AM  (use smartphrase ".now" to insert current time)  Test: Troponin Critical Value: 65  Name of Provider Notified: Dr. Corky Downs   Orders Received? Or Actions Taken?: No new orders at this time

## 2020-08-11 NOTE — ED Notes (Signed)
Pt transported to CT ?

## 2020-08-11 NOTE — ED Notes (Signed)
Pharmacy called to have Nipride sent to ED ASAP

## 2020-08-11 NOTE — ED Provider Notes (Signed)
[facility] Emergency Department Provider Note   ____________________________________________    I have reviewed the triage vital signs and the nursing notes.   HISTORY  Chief Complaint Chest Pain     HPI Wayne Scott is a 69 y.o. male with a history of psoriatic arthritis on monoclonal antibody treatment, recent diagnosis of diabetes who presents with complaints of chest pain, shortness of breath and pleurisy.  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  He was concerned about going to the emergency department because of wait times but by the next day his pain had not improved so he went to urgent care.  Was referred to the emergency department, presented to the emergency department today with continued pain.  No history of blood clots or DVT.  Did receive his third booster shot given his immunosuppressed status on Monday of this week.  No fevers chills or cough.  Describes significant pleurisy  Past Medical History:  Diagnosis Date  . Allergic rhinitis   . Arthritis   . Diabetes mellitus without complication ([place])   . GERD (gastroesophageal reflux disease)   . Headache   . Heart murmur   . Hypertension   . Macular degeneration   . OSA (obstructive sleep apnea)    uses CPAP  . Psoriasis     Patient Active Problem List   Diagnosis Date Noted  . Bloating 01/15/2020  . Subconjunctival hemorrhage 01/15/2020  . Hyperlipidemia 10/27/2019  . Anxiety 03/31/2019  . Diabetes ([place]) 03/31/2019  . Ureteral calculus, left 11/05/2017  . Obesity (BMI 30-39.9) 10/23/2017  . Constipation 07/02/2017  . Sleeping difficulty 07/02/2017  . Obstructive sleep apnea 11/14/2015  . Psoriasis 11/14/2015  . Essential hypertension 09/19/2015  . Dyspnea 09/19/2015  . Lightheadedness 09/19/2015  . Psoriatic arthritis ([place]) 09/19/2015  . GERD  (gastroesophageal reflux disease) 09/19/2015    Past Surgical History:  Procedure Laterality Date  . CATARACT EXTRACTION    . EXTRACORPOREAL SHOCK WAVE LITHOTRIPSY Left 11/14/2017   Procedure: EXTRACORPOREAL SHOCK WAVE LITHOTRIPSY (ESWL);  Surgeon: Hollice Espy, MD;  Location: [facility] ORS;  Service: Urology;  Laterality: Left;  . NASAL SINUS SURGERY    . TONSILLECTOMY    . VASECTOMY      Prior to Admission medications   Medication Sig Start Date End Date Taking? Authorizing Provider  amLODipine (NORVASC) 10 MG tablet Take 0.5 tablets (5 mg total) by mouth daily. 04/26/20   Leone Haven, MD  B Complex-C (SUPER B COMPLEX PO) Take by mouth.    [provider]  cholecalciferol (VITAMIN D) 1000 units tablet Take 1,000 Units by mouth daily.    [provider]  fluticasone (FLONASE) 50 MCG/ACT nasal spray Place 2 sprays into both nostrils daily. 04/26/20   Leone Haven, MD  metFORMIN (GLUCOPHAGE) 500 MG tablet Take 1 tablet (500 mg total) by mouth 2 (two) times daily with a meal. 05/04/20   Leone Haven, MD  mometasone (ELOCON) 0.1 % cream APPLY TO AFFECTED AREA EVERY DAY 11/14/15   Leone Haven, MD  Multiple Vitamin (MULTIVITAMIN) capsule Take 1 capsule by mouth daily.    [provider]  naproxen sodium (ANAPROX) 220 MG tablet Take 220 mg by mouth as needed.    [provider]  NONFORMULARY OR COMPOUNDED ITEM 125 mg.     [provider]  NONFORMULARY OR COMPOUNDED ITEM  SuperTrimix (30/2/20)-(Pap/Phent/PGE) Dosage: Inject 0.5- 1.0 (tirate up as needed)cc per injection Prefilled syringe 54ml syringe Qty10 [place] [phone] Fax [phone] 01/27/19   Hollice Espy, MD  omeprazole (PRILOSEC) 20 MG capsule TAKE 1 CAPSULE BY MOUTH EVERY DAY 05/04/20   Leone Haven, MD  polyethylene glycol powder (GLYCOLAX/MIRALAX) powder TAKE 17 G BY MOUTH DAILY AS NEEDED FOR MILD CONSTIPATION. 11/08/17   Leone Haven, MD  pravastatin (PRAVACHOL) 40 MG tablet Take 1 tablet (40 mg total) by mouth daily. 04/28/20   Leone Haven, MD  Probiotic Product (PROBIOTIC ADVANCED) CAPS Take by mouth.    [provider]  Secukinumab (COSENTYX [place]) Inject 1 application into the skin every 30 (thirty) days.    [provider]     Allergies Patient has no known allergies.  Family History  Problem Relation Age of Onset  . Neuropathy Mother   . Stroke Mother   . Macular degeneration Mother   . Diabetes Father   . Arthritis Other        Parent  . Hypertension Other        Parent  . Diabetes Other        Parent  . Heart attack Brother   . Kidney cancer Brother   . Bladder Cancer Neg Hx   . Prostate cancer Neg Hx     Social History Social History   Tobacco Use  . Smoking status: Never Smoker  . Smokeless tobacco: Never Used  Substance Use Topics  . Alcohol use: Yes    Alcohol/week: 2.0 standard drinks    Types: 1 Cans of beer, 1 Shots of liquor per week    Comment: once or twice a month  . Drug use: No    Review of Systems  Constitutional: No fever/chills Eyes: No visual changes.  ENT: No sore throat. Cardiovascular: As above Respiratory: As above Gastrointestinal: No abdominal pain.   Genitourinary: Negative for dysuria. Musculoskeletal: Negative for back pain. Skin: Negative for rash. Neurological: Negative for headaches    ____________________________________________   PHYSICAL EXAM:  VITAL SIGNS: ED Triage Vitals  Enc Vitals Group     BP 08/11/20 0505 (!) 148/75     Pulse Rate 08/11/20 0505 94     Resp 08/11/20 0505 20     Temp 08/11/20 0507 98.9 F (37.2 C)     Temp Source 08/11/20 0507 Oral     SpO2 08/11/20 0505 97 %     Weight 08/11/20 0506 115.7 kg (255 lb)     Height 08/11/20 0506 1.829 m (6')     Head Circumference --      Peak Flow --      Pain Score 08/11/20 0506 8     Pain Loc --      Pain Edu? --      Excl. in [place]? --      Constitutional: Alert and oriented.   Nose: No congestion/rhinnorhea. Mouth/Throat: Mucous membranes are moist.    Cardiovascular: Normal rate, regular rhythm.  Significant systolic murmur, good peripheral circulation. Respiratory: Normal respiratory effort.  No retractions. Lungs CTAB. Gastrointestinal: Soft and nontender. No distention.    Musculoskeletal: No lower extremity tenderness nor edema.  Warm and well perfused Neurologic:  Normal speech and language. No gross focal neurologic deficits are appreciated.  Skin:  Skin is warm, dry and intact. No rash noted. Psychiatric: Mood and affect are normal. Speech and behavior are normal.  ____________________________________________   LABS (all labs ordered  are listed, but only abnormal results are displayed)  Labs Reviewed  CBC - Abnormal; Notable for the following components:      Result Value   WBC 15.1 (*)    MCHC 36.2 (*)    Platelets 127 (*)    All other components within normal limits  COMPREHENSIVE METABOLIC PANEL - Abnormal; Notable for the following components:   Potassium 3.4 (*)    Glucose, Bld 148 (*)    Calcium 8.7 (*)    Total Bilirubin 2.4 (*)    All other components within normal limits  TROPONIN I (HIGH SENSITIVITY) - Abnormal; Notable for the following components:   Troponin I (High Sensitivity) 46 (*)    All other components within normal limits  TROPONIN I (HIGH SENSITIVITY) - Abnormal; Notable for the following components:   Troponin I (High Sensitivity) 82 (*)    All other components within normal limits  SARS CORONAVIRUS 2 BY RT PCR (HOSPITAL ORDER, [place] [facility])   ____________________________________________  EKG  ED ECG REPORT I, Lavonia Drafts, the attending physician, personally viewed and interpreted this ECG.  Date: 08/11/2020  Rhythm: normal sinus rhythm QRS Axis: normal Intervals: normal ST/T Wave abnormalities: Nonspecific changes Narrative  Interpretation: no evidence of acute ischemia  ____________________________________________  RADIOLOGY  Chest x-ray viewed by me, no infiltrate effusion or pneumothorax CT angiography, contacted by radiologist and notified of type a dissection ____________________________________________   PROCEDURES  Procedure(s) performed: No  Procedures   Critical Care performed: yes  CRITICAL CARE Performed by: Lavonia Drafts   Total critical care time: 30 minutes  Critical care time was exclusive of separately billable procedures and treating other patients.  Critical care was necessary to treat or prevent imminent or life-threatening deterioration.  Critical care was time spent personally by me on the following activities: development of treatment plan with patient and/or surrogate as well as nursing, discussions with consultants, evaluation of patient's response to treatment, examination of patient, obtaining history from patient or surrogate, ordering and performing treatments and interventions, ordering and review of laboratory studies, ordering and review of radiographic studies, pulse oximetry and re-evaluation of patient's condition.  ____________________________________________   INITIAL IMPRESSION / ASSESSMENT AND PLAN / ED COURSE  Pertinent labs & imaging results that were available during my care of the patient were reviewed by me and considered in my medical decision making (see chart for details).  Patient presents with abrupt onset of chest pain pleurisy shortness of breath as described above.  Patient treated with IV morphine, IV Zofran with significant improvement in pain  Highly suspicious for pulmonary embolism, less likely ACS/angina, also possibility of aortic dissection.  Elevated troponin of 46 noted, elevated white blood cell count of 15 noted, stat CT angiography ordered  Contacted by radiologist of a sending aortic dissection on CT angiography  Esmolol  drip ordered  Initially contacted [facility] for transfer however they have no capacity  No significant improvement in blood pressure so have ordered nitroprusside drip  Contacted [facility] and patient accepted by Dr. 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   Lavonia Drafts, MD 08/11/20 1254

## 2020-08-11 NOTE — ED Notes (Signed)
[facility]

## 2020-08-11 NOTE — ED Notes (Signed)
[place] MD

## 2020-08-11 NOTE — ED Triage Notes (Signed)
Pt here for chest pain states started Tuesday, did get 3rd Covid vaccine and flu shot Monday. Went to walk in clinic yesterday and they called EMS due to "abnormal ekg". EMT's stated his EKG was normal and advised he not come to ED. Pt here for persistent chest pain and shob.

## 2020-08-11 NOTE — ED Notes (Addendum)
MD made aware of pt's BP remaining in 585F systolic. Waiting for verbal orders to be placed.

## 2020-08-12 ENCOUNTER — Ambulatory Visit: Payer: Medicare Other | Admitting: Family Medicine

## 2020-08-19 ENCOUNTER — Telehealth: Payer: Self-pay | Admitting: Family Medicine

## 2020-08-19 NOTE — Telephone Encounter (Signed)
Pt's wife Pamala Hurry called and states that the pt has been in the hospital and they suggest that Dr Caryl Bis prescribe a glucometer. Please send that and strips to [facility] on [address] if possible. Call wife on her cell

## 2020-08-19 NOTE — Telephone Encounter (Signed)
Pt's wife Pamala Hurry called and states that the pt has been in the hospital and they suggest that Dr Caryl Bis prescribe a glucometer. Please send that and strips to [facility] [address] if possible. Kamdyn Covel,cma

## 2020-08-20 MED ORDER — BLOOD GLUCOSE MONITOR KIT: PACK | 0 refills | Status: AC

## 2020-08-20 NOTE — Telephone Encounter (Signed)
Printed.  Please fax to the pharmacy.  Please also see if he is out of the hospital and if so he should be set up for hospital follow-up.  Thanks.

## 2020-08-22 ENCOUNTER — Telehealth: Payer: Self-pay

## 2020-08-22 NOTE — Telephone Encounter (Signed)
Discharge from [facility] 08/19/20.  First attempt to reach patient for transitional care management. No answer. Left message to call the office back and schedule hospital follow up. Will follow as appropriate.

## 2020-08-22 NOTE — Telephone Encounter (Signed)
Rx for glucometer and strips was faxed to [place] today, confirmation given.  Gae Bon

## 2020-08-22 NOTE — Telephone Encounter (Signed)
First attempt to reach patient for transitional care management. No answer. Left message to call the office back and schedule hospital follow up. Will follow as appropriate.

## 2020-08-23 NOTE — Telephone Encounter (Signed)
Second outreach call for transitional care management. No answer. Left message to call the office back and schedule hospital follow up with primary care provider. Transition of care phone call follow up closed.

## 2020-08-24 ENCOUNTER — Telehealth: Payer: Self-pay

## 2020-08-24 ENCOUNTER — Telehealth: Payer: Self-pay | Admitting: Family Medicine

## 2020-08-24 NOTE — Telephone Encounter (Signed)
Patient was released from [facility] on Friday, 08/19/2020 from having open heart surgery on 08/11/2020. Patient already spoke to his cardiologist about the issues he is having and his medication. After speaking to his cardiologist, patient was recommended to call his provider and have a EKG done, as a base line. Patient is experiencing dizzy, almost blacking out, symptomatic Afib. Per patient cardiologist is aware of his issues.

## 2020-08-24 NOTE — Telephone Encounter (Signed)
I tried to call the patient and see about setting up an appointment with the provider for this Friday and could not reach him by phone, could not leave a voice mail.  See prior telephone note and prior note from [place].  Azucena Dart,cma

## 2020-08-24 NOTE — Telephone Encounter (Signed)
I have called every number on the DPR to reach the patient to see if he had an appointment today and to schedule him to see his PCP with no results.  See prior notes.  Rhian Funari,cma

## 2020-08-30 DIAGNOSIS — R9431 Abnormal electrocardiogram [ECG] [EKG]: Secondary | ICD-10-CM | POA: Diagnosis not present

## 2020-08-30 DIAGNOSIS — I4589 Other specified conduction disorders: Secondary | ICD-10-CM | POA: Diagnosis not present

## 2020-08-30 DIAGNOSIS — Z9889 Other specified postprocedural states: Secondary | ICD-10-CM | POA: Diagnosis not present

## 2020-08-30 DIAGNOSIS — I119 Hypertensive heart disease without heart failure: Secondary | ICD-10-CM | POA: Diagnosis not present

## 2020-08-30 DIAGNOSIS — R000 Tachycardia, unspecified: Secondary | ICD-10-CM | POA: Diagnosis not present

## 2020-08-30 DIAGNOSIS — Z8679 Personal history of other diseases of the circulatory system: Secondary | ICD-10-CM | POA: Diagnosis not present

## 2020-08-30 DIAGNOSIS — J90 Pleural effusion, not elsewhere classified: Secondary | ICD-10-CM | POA: Diagnosis not present

## 2020-08-30 DIAGNOSIS — Z48812 Encounter for surgical aftercare following surgery on the circulatory system: Secondary | ICD-10-CM | POA: Diagnosis not present

## 2020-08-30 DIAGNOSIS — Z79899 Other long term (current) drug therapy: Secondary | ICD-10-CM | POA: Diagnosis not present

## 2020-08-30 DIAGNOSIS — I480 Paroxysmal atrial fibrillation: Secondary | ICD-10-CM | POA: Diagnosis not present

## 2020-09-05 DIAGNOSIS — H353211 Exudative age-related macular degeneration, right eye, with active choroidal neovascularization: Secondary | ICD-10-CM | POA: Diagnosis not present

## 2020-09-07 DIAGNOSIS — I10 Essential (primary) hypertension: Secondary | ICD-10-CM | POA: Diagnosis not present

## 2020-09-07 DIAGNOSIS — I4891 Unspecified atrial fibrillation: Secondary | ICD-10-CM | POA: Diagnosis not present

## 2020-09-07 DIAGNOSIS — E119 Type 2 diabetes mellitus without complications: Secondary | ICD-10-CM | POA: Diagnosis not present

## 2020-09-07 DIAGNOSIS — L4050 Arthropathic psoriasis, unspecified: Secondary | ICD-10-CM | POA: Diagnosis not present

## 2020-09-19 DIAGNOSIS — I10 Essential (primary) hypertension: Secondary | ICD-10-CM | POA: Diagnosis not present

## 2020-09-19 DIAGNOSIS — I4891 Unspecified atrial fibrillation: Secondary | ICD-10-CM | POA: Diagnosis not present

## 2020-09-19 DIAGNOSIS — E782 Mixed hyperlipidemia: Secondary | ICD-10-CM | POA: Diagnosis not present

## 2020-09-19 DIAGNOSIS — I7101 Dissection of thoracic aorta: Secondary | ICD-10-CM | POA: Diagnosis not present

## 2020-09-19 DIAGNOSIS — I7100 Dissection of unspecified site of aorta: Secondary | ICD-10-CM | POA: Diagnosis not present

## 2020-09-19 DIAGNOSIS — I719 Aortic aneurysm of unspecified site, without rupture: Secondary | ICD-10-CM | POA: Diagnosis not present

## 2020-09-22 DIAGNOSIS — Z79899 Other long term (current) drug therapy: Secondary | ICD-10-CM | POA: Diagnosis not present

## 2020-09-22 DIAGNOSIS — L4050 Arthropathic psoriasis, unspecified: Secondary | ICD-10-CM | POA: Diagnosis not present

## 2020-09-22 DIAGNOSIS — L409 Psoriasis, unspecified: Secondary | ICD-10-CM | POA: Diagnosis not present

## 2020-09-22 DIAGNOSIS — Z8679 Personal history of other diseases of the circulatory system: Secondary | ICD-10-CM | POA: Diagnosis not present

## 2020-09-22 DIAGNOSIS — Z9889 Other specified postprocedural states: Secondary | ICD-10-CM | POA: Diagnosis not present

## 2020-09-26 DIAGNOSIS — H353222 Exudative age-related macular degeneration, left eye, with inactive choroidal neovascularization: Secondary | ICD-10-CM | POA: Diagnosis not present

## 2020-10-17 DIAGNOSIS — I7101 Dissection of thoracic aorta: Secondary | ICD-10-CM | POA: Diagnosis not present

## 2020-10-17 DIAGNOSIS — E782 Mixed hyperlipidemia: Secondary | ICD-10-CM | POA: Diagnosis not present

## 2020-10-17 DIAGNOSIS — I10 Essential (primary) hypertension: Secondary | ICD-10-CM | POA: Diagnosis not present

## 2020-10-17 DIAGNOSIS — I480 Paroxysmal atrial fibrillation: Secondary | ICD-10-CM | POA: Diagnosis not present

## 2020-10-26 ENCOUNTER — Ambulatory Visit: Payer: Medicare Other

## 2020-11-08 DIAGNOSIS — Z79899 Other long term (current) drug therapy: Secondary | ICD-10-CM | POA: Diagnosis not present

## 2020-11-08 DIAGNOSIS — Z9889 Other specified postprocedural states: Secondary | ICD-10-CM | POA: Diagnosis not present

## 2020-11-08 DIAGNOSIS — Z8679 Personal history of other diseases of the circulatory system: Secondary | ICD-10-CM | POA: Diagnosis not present

## 2020-11-08 DIAGNOSIS — I480 Paroxysmal atrial fibrillation: Secondary | ICD-10-CM | POA: Diagnosis not present

## 2020-11-08 DIAGNOSIS — Z48812 Encounter for surgical aftercare following surgery on the circulatory system: Secondary | ICD-10-CM | POA: Diagnosis not present

## 2020-11-08 DIAGNOSIS — I77810 Thoracic aortic ectasia: Secondary | ICD-10-CM | POA: Diagnosis not present

## 2020-11-08 DIAGNOSIS — Z4889 Encounter for other specified surgical aftercare: Secondary | ICD-10-CM | POA: Diagnosis not present

## 2020-11-10 DIAGNOSIS — H6062 Unspecified chronic otitis externa, left ear: Secondary | ICD-10-CM | POA: Diagnosis not present

## 2020-11-10 DIAGNOSIS — H6122 Impacted cerumen, left ear: Secondary | ICD-10-CM | POA: Diagnosis not present

## 2020-11-10 DIAGNOSIS — H9212 Otorrhea, left ear: Secondary | ICD-10-CM | POA: Diagnosis not present

## 2020-11-15 ENCOUNTER — Other Ambulatory Visit: Payer: Self-pay

## 2020-11-15 ENCOUNTER — Telehealth: Payer: Self-pay | Admitting: Family Medicine

## 2020-11-15 DIAGNOSIS — I10 Essential (primary) hypertension: Secondary | ICD-10-CM

## 2020-11-15 MED ORDER — AMLODIPINE BESYLATE 10 MG PO TABS: 5.0000 mg | ORAL_TABLET | Freq: Every day | ORAL | 1 refills | Status: DC

## 2020-11-15 NOTE — Telephone Encounter (Signed)
-----   Message from Dia Crawford, LPN sent at 20/35/5974  5:19 PM EST ----- Regarding: FW: remove pcp Please remove pcp. Patient no longer at [facility]. ----- Message ----- From: Salvatore Marvel Sent: 11/14/2020   7:53 AM EST To: Bryson Corona O'Brien-Blaney, LPN Subject: RE: remove pcp                                 Can you remove Dr. Caryl Bis or should I send to [place]? I dont have the security to do it   ----- Message ----- From: Dia Crawford, LPN Sent: 16/38/4536   4:42 PM EST To: Salvatore Marvel Subject: RE: remove pcp                                 This is correct. Patient stated he had already moved out of the state and following up with new Primary Care ----- Message ----- From: Salvatore Marvel Sent: 11/11/2020   9:01 AM EST To: Denisa L O'Brien-Blaney, LPN Subject: remove pcp                                     Per cancellation reason on 10/26/20 is pt no longer a pt

## 2020-11-15 NOTE — Telephone Encounter (Signed)
PCP removed from the patient account.

## 2020-11-22 DIAGNOSIS — H353211 Exudative age-related macular degeneration, right eye, with active choroidal neovascularization: Secondary | ICD-10-CM | POA: Diagnosis not present

## 2020-11-29 ENCOUNTER — Other Ambulatory Visit: Payer: Self-pay | Admitting: Family Medicine

## 2020-12-01 DIAGNOSIS — I4891 Unspecified atrial fibrillation: Secondary | ICD-10-CM | POA: Diagnosis not present

## 2020-12-01 DIAGNOSIS — G4733 Obstructive sleep apnea (adult) (pediatric): Secondary | ICD-10-CM | POA: Diagnosis not present

## 2020-12-01 DIAGNOSIS — E119 Type 2 diabetes mellitus without complications: Secondary | ICD-10-CM | POA: Diagnosis not present

## 2020-12-01 DIAGNOSIS — I10 Essential (primary) hypertension: Secondary | ICD-10-CM | POA: Diagnosis not present

## 2020-12-08 DIAGNOSIS — H6122 Impacted cerumen, left ear: Secondary | ICD-10-CM | POA: Diagnosis not present

## 2020-12-08 DIAGNOSIS — H6063 Unspecified chronic otitis externa, bilateral: Secondary | ICD-10-CM | POA: Diagnosis not present

## 2020-12-09 DIAGNOSIS — L4050 Arthropathic psoriasis, unspecified: Secondary | ICD-10-CM | POA: Diagnosis not present

## 2020-12-09 DIAGNOSIS — I10 Essential (primary) hypertension: Secondary | ICD-10-CM | POA: Diagnosis not present

## 2020-12-09 DIAGNOSIS — E119 Type 2 diabetes mellitus without complications: Secondary | ICD-10-CM | POA: Diagnosis not present

## 2020-12-09 DIAGNOSIS — G4733 Obstructive sleep apnea (adult) (pediatric): Secondary | ICD-10-CM | POA: Diagnosis not present

## 2020-12-09 DIAGNOSIS — I4891 Unspecified atrial fibrillation: Secondary | ICD-10-CM | POA: Diagnosis not present

## 2020-12-14 DIAGNOSIS — I7101 Dissection of thoracic aorta: Secondary | ICD-10-CM | POA: Diagnosis not present

## 2020-12-14 DIAGNOSIS — I480 Paroxysmal atrial fibrillation: Secondary | ICD-10-CM | POA: Diagnosis not present

## 2020-12-14 DIAGNOSIS — E782 Mixed hyperlipidemia: Secondary | ICD-10-CM | POA: Diagnosis not present

## 2020-12-14 DIAGNOSIS — I10 Essential (primary) hypertension: Secondary | ICD-10-CM | POA: Diagnosis not present

## 2020-12-21 ENCOUNTER — Encounter: Payer: Medicare Other | Attending: Infectious Diseases | Admitting: *Deleted

## 2020-12-21 ENCOUNTER — Encounter: Payer: Self-pay | Admitting: *Deleted

## 2020-12-21 ENCOUNTER — Other Ambulatory Visit: Payer: Self-pay

## 2020-12-21 VITALS — BP 128/62 | Ht 72.0 in | Wt 254.2 lb

## 2020-12-21 DIAGNOSIS — G4733 Obstructive sleep apnea (adult) (pediatric): Secondary | ICD-10-CM | POA: Diagnosis not present

## 2020-12-21 DIAGNOSIS — E119 Type 2 diabetes mellitus without complications: Secondary | ICD-10-CM | POA: Insufficient documentation

## 2020-12-21 DIAGNOSIS — I4891 Unspecified atrial fibrillation: Secondary | ICD-10-CM | POA: Insufficient documentation

## 2020-12-21 DIAGNOSIS — I10 Essential (primary) hypertension: Secondary | ICD-10-CM | POA: Insufficient documentation

## 2020-12-21 NOTE — Patient Instructions (Signed)
Check blood sugars 1 x day before breakfast or 2 hrs after one meal every day Bring blood sugar records to the next class  Exercise: Continue exercise program   for  20  minutes  2-3 days a week and gradually increase to 30 minutes 5 x week  Eat 3 meals day,  1-2  snacks a day Space meals 4-6 hours apart Don't skip meals  Return for classes on:

## 2020-12-21 NOTE — Progress Notes (Signed)
Diabetes Self-Management Education  Visit Type: First/Initial  Appt. Start Time: 1100 Appt. End Time: 1225  12/21/2020  [name], identified by name and date of birth, is a 70 y.o. male with a diagnosis of Diabetes: Type 2.   ASSESSMENT  Blood pressure 128/62, height 6' (1.829 m), weight 254 lb 3.2 oz (115.3 kg). Body mass index is 34.48 kg/m.   Diabetes Self-Management Education - 12/21/20 1249      Visit Information   Visit Type First/Initial      Initial Visit   Diabetes Type Type 2    Are you currently following a meal plan? Yes    What type of meal plan do you follow? "cut down on carbs and sugar"    Are you taking your medications as prescribed? Yes    Date Diagnosed 1 year      Health Coping   How would you rate your overall health? Fair      Psychosocial Assessment   Patient Belief/Attitude about Diabetes Motivated to manage diabetes   "concerned"   Self-care barriers None    Self-management support Doctor's office;Family    Other persons present Spouse/SO    Patient Concerns Nutrition/Meal planning;Glycemic Control;Medication;Monitoring;Healthy Lifestyle;Weight Control    Special Needs None    Preferred Learning Style Auditory    Learning Readiness Change in progress    How often do you need to have someone help you when you read instructions, pamphlets, or other written materials from your doctor or pharmacy? 1 - Never    What is the last grade level you completed in school? high school      Pre-Education Assessment   Patient understands the diabetes disease and treatment process. Needs Instruction    Patient understands incorporating nutritional management into lifestyle. Needs Instruction    Patient undertands incorporating physical activity into lifestyle. Needs Instruction    Patient understands using medications safely. Needs Instruction    Patient understands monitoring blood glucose, interpreting and using results Needs Review    Patient  understands prevention, detection, and treatment of acute complications. Needs Instruction    Patient understands prevention, detection, and treatment of chronic complications. Needs Instruction    Patient understands how to develop strategies to address psychosocial issues. Needs Instruction    Patient understands how to develop strategies to promote health/change behavior. Needs Instruction      Complications   Last HgB A1C per patient/outside source 6.6 %   12/01/2020   How often do you check your blood sugar? 1-2 times/day    Fasting Blood glucose range (mg/dL) 70-129;130-179   Pt reports FBG's 115-140 mg/dL.   Have you had a dilated eye exam in the past 12 months? Yes    Have you had a dental exam in the past 12 months? Yes    Are you checking your feet? No      Dietary Intake   Breakfast eggs (omelet, sandwich with Dave's bread); [place] yogurt, apple and peanut butter    Lunch sometimes skips - left overs    Dinner chicken, occasional beef, pork fish (salmon, tuna, talopia); small potatoes, corn, peas, beans, brown rice, pasta, green beans, zucchini, squash, broccoli, cauliflower, carrots, salad (lettuce, tomaotes, cuccumbers, cheese, onions, olives, artichoke)    Snack (evening) popcorn, pork rinds, nacho chips with cheese, pinepple    Beverage(s) water, coffee, occasional diet soda      Exercise   Exercise Type Light (walking / raking leaves)    How many days per week to  you exercise? 2    How many minutes per day do you exercise? 30    Total minutes per week of exercise 60      Patient Education   Previous Diabetes Education No    Disease state  Definition of diabetes, type 1 and 2, and the diagnosis of diabetes;Factors that contribute to the development of diabetes    Nutrition management  Role of diet in the treatment of diabetes and the relationship between the three main macronutrients and blood glucose level;Food label reading, portion sizes and measuring food.;Reviewed blood  glucose goals for pre and post meals and how to evaluate the patients' food intake on their blood glucose level.;Meal timing in regards to the patients' current diabetes medication.    Physical activity and exercise  Role of exercise on diabetes management, blood pressure control and cardiac health.    Medications Reviewed patients medication for diabetes, action, purpose, timing of dose and side effects.    Monitoring Purpose and frequency of SMBG.;Taught/discussed recording of test results and interpretation of SMBG.;Identified appropriate SMBG and/or A1C goals.    Chronic complications Relationship between chronic complications and blood glucose control    Psychosocial adjustment Role of stress on diabetes;Identified and addressed patients feelings and concerns about diabetes      Individualized Goals (developed by patient)   Reducing Risk Other (comment)   improve blood sugars, decrease medications, prevent diabetes complications, lose weight, lead a healthier lifestyle     Outcomes   Expected Outcomes Demonstrated interest in learning. Expect positive outcomes           Individualized Plan for Diabetes Self-Management Training:   Learning Objective:  Patient will have a greater understanding of diabetes self-management. Patient education plan is to attend individual and/or group sessions per assessed needs and concerns.   Plan:   Patient Instructions  Check blood sugars 1 x day before breakfast or 2 hrs after one meal every day Bring blood sugar records to the next class  Exercise: Continue exercise program   for  20  minutes  2-3 days a week and gradually increase to 30 minutes 5 x week  Eat 3 meals day,  1-2  snacks a day Space meals 4-6 hours apart Don't skip meals  Expected Outcomes:  Demonstrated interest in learning. Expect positive outcomes  Education material provided:  General Meal Planning Guidelines Simple Meal Plan Smart Snacking  If problems or questions,  patient to contact team via:  Johny Drilling, RN, [place] [phone]  Future DSME appointment:  December 29, 2020 for Diabetes Class 1

## 2020-12-29 ENCOUNTER — Encounter: Payer: Medicare Other | Attending: Infectious Diseases | Admitting: Dietician

## 2020-12-29 ENCOUNTER — Encounter: Payer: Self-pay | Admitting: Dietician

## 2020-12-29 ENCOUNTER — Other Ambulatory Visit: Payer: Self-pay

## 2020-12-29 VITALS — Ht 72.0 in | Wt 253.5 lb

## 2020-12-29 DIAGNOSIS — E119 Type 2 diabetes mellitus without complications: Secondary | ICD-10-CM | POA: Diagnosis not present

## 2020-12-29 NOTE — Progress Notes (Signed)

## 2021-01-02 DIAGNOSIS — D3131 Benign neoplasm of right choroid: Secondary | ICD-10-CM | POA: Diagnosis not present

## 2021-01-02 DIAGNOSIS — H353211 Exudative age-related macular degeneration, right eye, with active choroidal neovascularization: Secondary | ICD-10-CM | POA: Diagnosis not present

## 2021-01-02 DIAGNOSIS — H353222 Exudative age-related macular degeneration, left eye, with inactive choroidal neovascularization: Secondary | ICD-10-CM | POA: Diagnosis not present

## 2021-01-05 ENCOUNTER — Other Ambulatory Visit: Payer: Self-pay

## 2021-01-05 ENCOUNTER — Encounter: Payer: Medicare Other | Admitting: *Deleted

## 2021-01-05 ENCOUNTER — Encounter: Payer: Self-pay | Admitting: *Deleted

## 2021-01-05 VITALS — Wt 257.0 lb

## 2021-01-05 DIAGNOSIS — E119 Type 2 diabetes mellitus without complications: Secondary | ICD-10-CM

## 2021-01-05 NOTE — Progress Notes (Signed)

## 2021-01-12 ENCOUNTER — Ambulatory Visit: Payer: Medicare Other

## 2021-01-16 ENCOUNTER — Encounter: Payer: Self-pay | Admitting: Physician Assistant

## 2021-01-16 ENCOUNTER — Other Ambulatory Visit: Payer: Self-pay

## 2021-01-16 ENCOUNTER — Ambulatory Visit (INDEPENDENT_AMBULATORY_CARE_PROVIDER_SITE_OTHER): Payer: Medicare Other | Admitting: Physician Assistant

## 2021-01-16 VITALS — BP 121/69 | HR 73 | Ht 72.0 in | Wt 254.0 lb

## 2021-01-16 DIAGNOSIS — N411 Chronic prostatitis: Secondary | ICD-10-CM

## 2021-01-16 DIAGNOSIS — R399 Unspecified symptoms and signs involving the genitourinary system: Secondary | ICD-10-CM | POA: Diagnosis not present

## 2021-01-16 LAB — URINALYSIS, COMPLETE
Bilirubin, UA: NEGATIVE
Ketones, UA: NEGATIVE
Leukocytes,UA: NEGATIVE
Nitrite, UA: POSITIVE — AB
Protein,UA: NEGATIVE
RBC, UA: NEGATIVE
Specific Gravity, UA: 1.015 (ref 1.005–1.030)
Urobilinogen, Ur: 1 mg/dL (ref 0.2–1.0)
pH, UA: 5 (ref 5.0–7.5)

## 2021-01-16 LAB — BLADDER SCAN AMB NON-IMAGING

## 2021-01-16 LAB — MICROSCOPIC EXAMINATION
Bacteria, UA: NONE SEEN
RBC, Urine: NONE SEEN /hpf (ref 0–2)

## 2021-01-16 MED ORDER — OXYBUTYNIN CHLORIDE ER 10 MG PO TB24: 10.0000 mg | ORAL_TABLET | Freq: Every day | ORAL | 0 refills | Status: DC

## 2021-01-16 MED ORDER — SILODOSIN 8 MG PO CAPS: 8.0000 mg | ORAL_CAPSULE | Freq: Every day | ORAL | 0 refills | Status: DC

## 2021-01-16 NOTE — Patient Instructions (Signed)
Start Rapaflo and Ditropan daily for the next month. If you do not notice any symptomatic improvement within 72 hours of starting these, please contact our office and we will add on an antibiotic.

## 2021-01-16 NOTE — Progress Notes (Signed)
01/16/2021 1:17 PM   Wayne Scott 1951-09-07 [id number]  CC: Chief Complaint  Patient presents with  . Urinary Tract Infection    HPI: Wayne Scott is a 70 y.o. male with PMH erectile dysfunction s/p IPP placement with Dr. Francesca Jewett on 04/08/2020, nephrolithiasis, and prostate nodule with normal PSA who presents today for evaluation of possible UTI.  He is scheduled for annual follow-up with Dr. Erlene Quan next week.  Today, patient reports a month-long history of frequency, urgency, nocturia, difficulty urinating, and weak urinary stream that has acutely worsened in the past 1-2 weeks.  PVR 143 mL. He denies dysuria and states his urgency is exacerbated with sitting.  Per chart review, patient complained of similar symptoms in June 2021 at his postop IPP visit with Dr. Francesca Jewett.  He had developed orthostasis on Flomax and was switched to Rapaflo at that time. Patient reports that his symptoms subsequently resolved before returning 1 month ago. He is no longer taking Rapaflo. On further questioning, he reports similar episodic urinary symptom worsening prior to IPP placement, sometimes with onset after sexual activity. Notably, patient has OSA and is compliant with CPAP.  In-office UA today positive for orange color, 1+ glucose, and nitrites; urine microscopy pan negative.  He has been taking Azo for symptom management  PMH: Past Medical History:  Diagnosis Date  . Allergic rhinitis   . Arthritis   . Diabetes mellitus without complication ([place])   . GERD (gastroesophageal reflux disease)   . Headache   . Heart murmur   . Hypertension   . Macular degeneration   . OSA (obstructive sleep apnea)    uses CPAP  . Psoriasis     Surgical History: Past Surgical History:  Procedure Laterality Date  . CATARACT EXTRACTION    . EXTRACORPOREAL SHOCK WAVE LITHOTRIPSY Left 11/14/2017   Procedure: EXTRACORPOREAL SHOCK WAVE LITHOTRIPSY (ESWL);  Surgeon: Hollice Espy, MD;  Location: [facility]  ORS;  Service: Urology;  Laterality: Left;  . NASAL SINUS SURGERY    . TONSILLECTOMY    . VASECTOMY      Home Medications:  Allergies as of 01/16/2021      Reactions   Atorvastatin Other (See Comments)   Leg cramps    Pravastatin Other (See Comments)   Leg cramps    Rosuvastatin Other (See Comments)      Medication List       Accurate as of January 16, 2021  1:17 PM. If you have any questions, ask your nurse or doctor.        acetic acid-hydrocortisone OTIC solution Commonly known as: VOSOL-HC   AMBIEN PO Take 1 tablet by mouth at bedtime as needed.   amiodarone 200 MG tablet Commonly known as: PACERONE Take 200 mg by mouth daily.   amLODipine 10 MG tablet Commonly known as: NORVASC Take 1 tablet by mouth daily.   blood glucose meter kit and supplies Kit Dispense based on patient and insurance preference. Use once daily as directed. (FOR ICD-10 E11.9).   cholecalciferol 1000 units tablet Commonly known as: VITAMIN D Take 1,000 Units by mouth daily.   COSENTYX [place] Inject 1 application into the skin every 30 (thirty) days.   Fish Oil 1000 MG Caps Take 4 capsules by mouth daily.   fluticasone 50 MCG/ACT nasal spray Commonly known as: FLONASE Place 2 sprays into both nostrils daily.   latanoprost 0.005 % ophthalmic solution Commonly known as: XALATAN SMARTSIG:In Eye(s)   lovastatin 20 MG tablet Commonly known as: MEVACOR  Take 20 mg by mouth daily.   metFORMIN 500 MG tablet Commonly known as: GLUCOPHAGE Take 500 mg by mouth daily.   metoprolol tartrate 50 MG tablet Commonly known as: LOPRESSOR Take 50 mg by mouth in the morning and at bedtime.   multivitamin capsule Take 1 capsule by mouth daily.   NONFORMULARY OR COMPOUNDED ITEM 125 mg.   NONFORMULARY OR COMPOUNDED ITEM SuperTrimix (30/2/20)-(Pap/Phent/PGE) Dosage: Inject 0.5- 1.0 (tirate up as needed)cc per injection Prefilled syringe 41ml syringe Qty10 [place] [phone] Fax [phone]   omeprazole 20 MG capsule Commonly known as: PRILOSEC TAKE 1 CAPSULE DAILY   polyethylene glycol powder 17 GM/SCOOP powder Commonly known as: GLYCOLAX/MIRALAX TAKE 17 G BY MOUTH DAILY AS NEEDED FOR MILD CONSTIPATION.   QC TUMERIC COMPLEX PO Take 2 capsules by mouth daily.   SUPER B COMPLEX PO Take 1 tablet by mouth daily.       Allergies:  Allergies  Allergen Reactions  . Atorvastatin Other (See Comments)    Leg cramps   . Pravastatin Other (See Comments)    Leg cramps   . Rosuvastatin Other (See Comments)    Family History: Family History  Problem Relation Age of Onset  . Neuropathy Mother   . Stroke Mother   . Macular degeneration Mother   . Diabetes Father   . Arthritis Other        Parent  . Hypertension Other        Parent  . Diabetes Other        Parent  . Heart attack Brother   . Kidney cancer Brother   . Bladder Cancer Neg Hx   . Prostate cancer Neg Hx     Social History:   reports that he has never smoked. He has never used smokeless tobacco. He reports current alcohol use of about 2.0 standard drinks of alcohol per week. He reports that he does not use drugs.  Physical Exam: BP 121/69 (BP Location: Left Arm, Patient Position: Sitting, Cuff Size: Large)   Pulse 73   Wt 254 lb (115.2 kg)   BMI 34.45 kg/m   Constitutional:  Alert and oriented, no acute distress, nontoxic appearing HEENT: [place], AT Cardiovascular: No clubbing, cyanosis, or edema Respiratory: Normal respiratory effort, no increased work of breathing Skin: No rashes, bruises or suspicious lesions Neurologic: Grossly intact, no focal deficits, moving all 4 extremities Psychiatric: Normal mood and affect  Laboratory Data: Results for orders placed or performed in visit on 01/16/21  Microscopic Examination   Urine  Result Value Ref Range   WBC, UA 0-5 0 - 5 /hpf   RBC None seen 0 - 2 /hpf   Epithelial Cells (non renal) 0-10 0 - 10 /hpf    Bacteria, UA None seen None seen/Few  Urinalysis, Complete  Result Value Ref Range   Specific Gravity, UA 1.015 1.005 - 1.030   pH, UA 5.0 5.0 - 7.5   Color, UA Orange Yellow   Appearance Ur Clear Clear   Leukocytes,UA Negative Negative   Protein,UA Negative Negative/Trace   Glucose, UA 1+ (A) Negative   Ketones, UA Negative Negative   RBC, UA Negative Negative   Bilirubin, UA Negative Negative   Urobilinogen, Ur 1.0 0.2 - 1.0 mg/dL   Nitrite, UA Positive (A) Negative   Microscopic Examination See below:   Bladder Scan (Post Void Residual) in office  Result Value Ref Range   Scan Result 16mL    Assessment & Plan:   1.  Chronic prostatitis 70 year old male with a history of episodic bothersome urinary symptoms consistent with inflammatory prostatitis.  PVR upper normal today off urinary agents.  Will restart Rapaflo given his history of orthostasis on Flomax and start 1 month of oxybutynin XL 10 mg given bothersome frequency.  I counseled the patient to contact clinic if he does not begin to see symptomatic improvement within 3 days, at which point we will start 1 month of tissue penetrating antibiotics per culture results.  He expressed understanding. - Urinalysis, Complete - CULTURE, URINE COMPREHENSIVE - Bladder Scan (Post Void Residual) in office - silodosin (RAPAFLO) 8 MG CAPS capsule; Take 1 capsule (8 mg total) by mouth daily with breakfast.  Dispense: 30 capsule; Refill: 0 - oxybutynin (DITROPAN-XL) 10 MG 24 hr tablet; Take 1 tablet (10 mg total) by mouth daily.  Dispense: 30 tablet; Refill: 0   Return if symptoms worsen or fail to improve, for Keep follow up w/ Dr. Erlene Quan.  Debroah Loop, PA-C  [facility] [address] [place], [place] [address] [phone]

## 2021-01-18 LAB — CULTURE, URINE COMPREHENSIVE

## 2021-01-19 ENCOUNTER — Telehealth: Payer: Self-pay | Admitting: *Deleted

## 2021-01-19 MED ORDER — SULFAMETHOXAZOLE-TRIMETHOPRIM 800-160 MG PO TABS
1.0000 | ORAL_TABLET | Freq: Two times a day (BID) | ORAL | 0 refills | Status: DC
Start: 2021-01-19 — End: 2021-02-15

## 2021-01-19 NOTE — Telephone Encounter (Signed)
Patient called triage line to discuss ongoing urinary symptoms-denies any new symptoms. Has been taking mediations as prescribed for three days-urinary symptoms have not improved. Would like to try antibiotic as previously discussed at office visit. Please advise.

## 2021-01-19 NOTE — Telephone Encounter (Signed)
Bactrim DS BID x28 days sent to [facility] on [address]. If he remains symptomatic despite pharmacotherapy, recommend cystoscopy for further evaluation. Please notify patient.

## 2021-01-19 NOTE — Telephone Encounter (Signed)
Patient notified, expressed understanding.  

## 2021-01-23 ENCOUNTER — Other Ambulatory Visit: Payer: Self-pay | Admitting: *Deleted

## 2021-01-23 DIAGNOSIS — Z125 Encounter for screening for malignant neoplasm of prostate: Secondary | ICD-10-CM

## 2021-01-23 DIAGNOSIS — N402 Nodular prostate without lower urinary tract symptoms: Secondary | ICD-10-CM

## 2021-01-23 DIAGNOSIS — N411 Chronic prostatitis: Secondary | ICD-10-CM

## 2021-01-24 DIAGNOSIS — L4050 Arthropathic psoriasis, unspecified: Secondary | ICD-10-CM | POA: Diagnosis not present

## 2021-01-24 DIAGNOSIS — Z79899 Other long term (current) drug therapy: Secondary | ICD-10-CM | POA: Diagnosis not present

## 2021-01-24 DIAGNOSIS — L409 Psoriasis, unspecified: Secondary | ICD-10-CM | POA: Diagnosis not present

## 2021-01-26 ENCOUNTER — Other Ambulatory Visit: Payer: Medicare Other

## 2021-01-26 ENCOUNTER — Other Ambulatory Visit: Payer: Self-pay

## 2021-01-26 DIAGNOSIS — N402 Nodular prostate without lower urinary tract symptoms: Secondary | ICD-10-CM

## 2021-01-27 ENCOUNTER — Other Ambulatory Visit: Payer: Self-pay

## 2021-01-27 ENCOUNTER — Ambulatory Visit
Admission: RE | Admit: 2021-01-27 | Discharge: 2021-01-27 | Disposition: A | Discharge status: Home / Self Care | Payer: Medicare Other | Source: Ambulatory Visit | Attending: Physician Assistant | Admitting: Physician Assistant

## 2021-01-27 ENCOUNTER — Encounter: Payer: Self-pay | Admitting: Urology

## 2021-01-27 ENCOUNTER — Ambulatory Visit (INDEPENDENT_AMBULATORY_CARE_PROVIDER_SITE_OTHER): Payer: Medicare Other | Admitting: Physician Assistant

## 2021-01-27 ENCOUNTER — Other Ambulatory Visit: Payer: Self-pay | Admitting: Physician Assistant

## 2021-01-27 ENCOUNTER — Encounter: Payer: Self-pay | Admitting: Physician Assistant

## 2021-01-27 ENCOUNTER — Telehealth: Payer: Self-pay | Admitting: Physician Assistant

## 2021-01-27 VITALS — BP 157/72 | HR 75 | Ht 72.0 in | Wt 250.0 lb

## 2021-01-27 DIAGNOSIS — Z87442 Personal history of urinary calculi: Secondary | ICD-10-CM

## 2021-01-27 DIAGNOSIS — N411 Chronic prostatitis: Secondary | ICD-10-CM | POA: Diagnosis not present

## 2021-01-27 DIAGNOSIS — R3915 Urgency of urination: Secondary | ICD-10-CM

## 2021-01-27 DIAGNOSIS — I878 Other specified disorders of veins: Secondary | ICD-10-CM | POA: Diagnosis not present

## 2021-01-27 DIAGNOSIS — R350 Frequency of micturition: Secondary | ICD-10-CM | POA: Diagnosis not present

## 2021-01-27 DIAGNOSIS — R300 Dysuria: Secondary | ICD-10-CM | POA: Diagnosis not present

## 2021-01-27 LAB — PSA: Prostate Specific Ag, Serum: 0.2 ng/mL (ref 0.0–4.0)

## 2021-01-27 LAB — MICROSCOPIC EXAMINATION: Epithelial Cells (non renal): NONE SEEN /hpf (ref 0–10)

## 2021-01-27 LAB — URINALYSIS, COMPLETE
Bilirubin, UA: NEGATIVE
Glucose, UA: NEGATIVE
Ketones, UA: NEGATIVE
Leukocytes,UA: NEGATIVE
Nitrite, UA: POSITIVE — AB
Protein,UA: NEGATIVE
RBC, UA: NEGATIVE
Specific Gravity, UA: 1.02 (ref 1.005–1.030)
Urobilinogen, Ur: 0.2 mg/dL (ref 0.2–1.0)
pH, UA: 6 (ref 5.0–7.5)

## 2021-01-27 LAB — BLADDER SCAN AMB NON-IMAGING: Scan Result: 47

## 2021-01-27 IMAGING — CR DG ABDOMEN 1V
1 series · 2 of 2 positions shown · non-contrast
Comparison: None.

CLINICAL DATA: Urinary frequency, dysuria, nephrolithiasis

EXAM:
ABDOMEN - 1 VIEW

[Series 1: dg abd 1 view · 0.14mm/px · 2 of 2 slices shown]
[im 1/2]
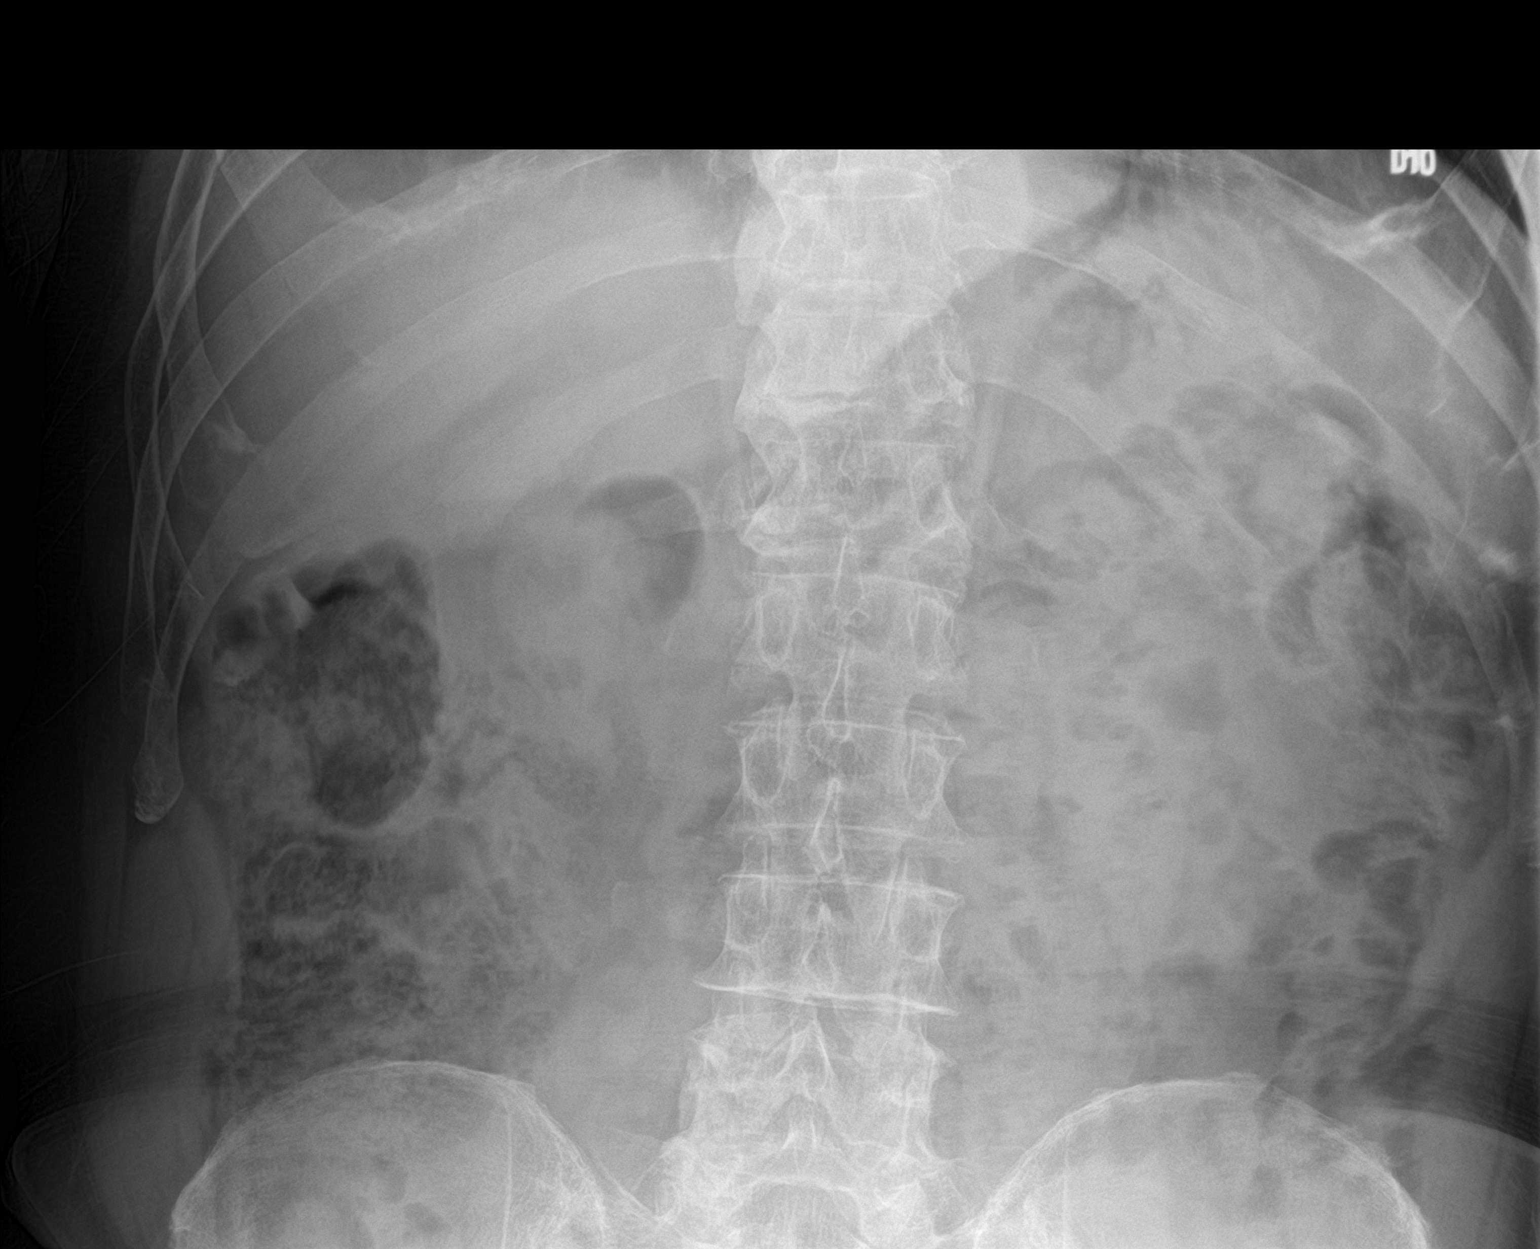
[im 2/2]
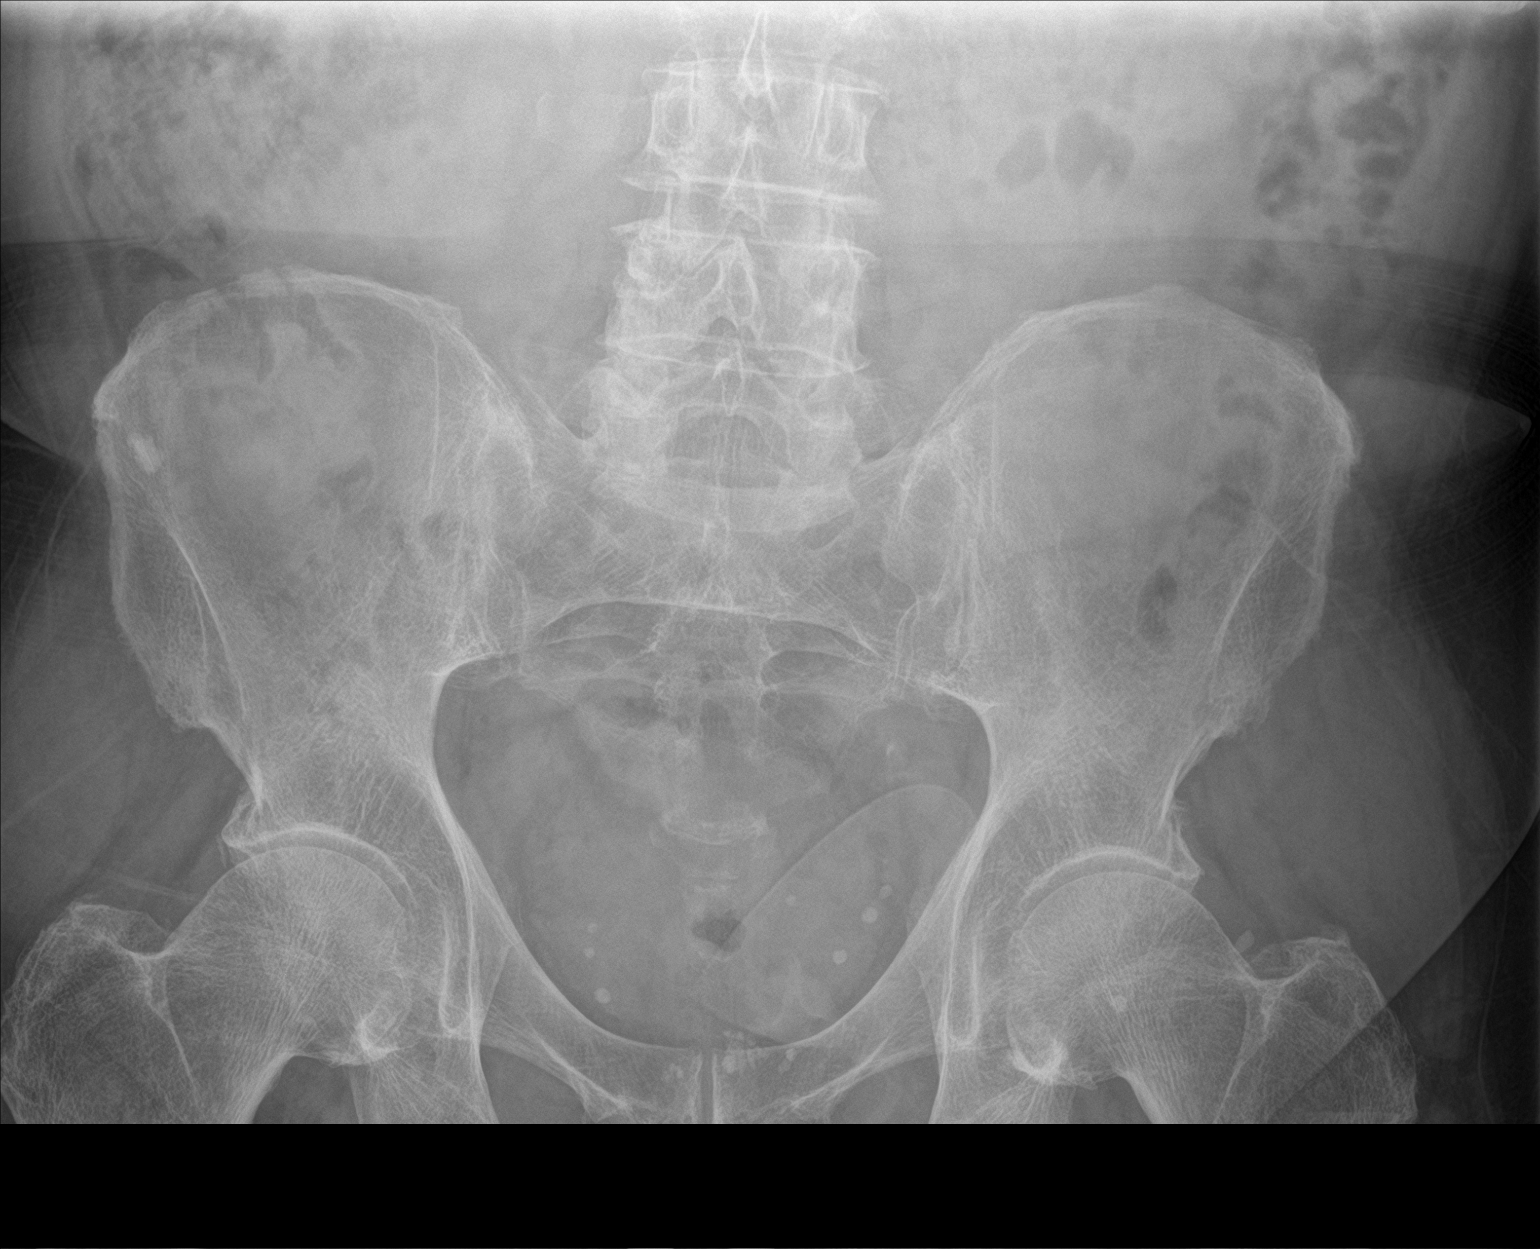

[2 of 2 positions shown; findings below may reference images not displayed]

FINDINGS: No definite nephro or urolithiasis. Moderate stool, however,
overlies the renal silhouettes bilaterally. Normal abdominal gas
hen. No organomegaly. Multiple phleboliths noted within the pelvis.
No acute bone abnormality.
IMPRESSION: No definite nephro or urolithiasis.

## 2021-01-27 MED ORDER — URIBEL 118 MG PO CAPS: 1.0000 | ORAL_CAPSULE | Freq: Four times a day (QID) | ORAL | 3 refills | Status: DC | PRN

## 2021-01-27 NOTE — Telephone Encounter (Signed)
.  left message to have patient return my call.  

## 2021-01-27 NOTE — Patient Instructions (Addendum)
1. Go to the imaging/radiology desk in the [place] on your way out from clinic today to get your abdominal X-ray. I'll call you with your results. 2. Continue Rapaflo, oxybutynin, and Bactrim. I'm prescribed Uribel today to take instead of Azo. 3. We'll plan for annual follow up with Dr. Erlene Quan next week with a cystoscopy as well at our [facility] clinic across from the [facility].   Cystoscopy Cystoscopy is a procedure that is used to help diagnose and sometimes treat conditions that affect the lower urinary tract. The lower urinary tract includes the bladder and the urethra. The urethra is the tube that drains urine from the bladder. Cystoscopy is done using a thin, tube-shaped instrument with a light and camera at the end (cystoscope). The cystoscope may be hard or flexible, depending on the goal of the procedure. The cystoscope is inserted through the urethra, into the bladder. Cystoscopy may be recommended if you have:  Urinary tract infections that keep coming back.  Blood in the urine (hematuria).  An inability to control when you urinate (urinary incontinence) or an overactive bladder.  Unusual cells found in a urine sample.  A blockage in the urethra, such as a urinary stone.  Painful urination.  An abnormality in the bladder found during an intravenous pyelogram (IVP) or CT scan. Cystoscopy may also be done to remove a sample of tissue to be examined under a microscope (biopsy). Tell a health care provider about:  Any allergies you have.  All medicines you are taking, including vitamins, herbs, eye drops, creams, and over-the-counter medicines.  Any problems you or family members have had with anesthetic medicines.  Any blood disorders you have.  Any surgeries you have had.  Any medical conditions you have.  Whether you are pregnant or may be pregnant. What are the risks? Generally, this is a safe procedure. However, problems may occur,  including:  Infection.  Bleeding.  Allergic reactions to medicines.  Damage to other structures or organs. What happens before the procedure? Medicines Ask your health care provider about:  Changing or stopping your regular medicines. This is especially important if you are taking diabetes medicines or blood thinners.  Taking medicines such as aspirin and ibuprofen. These medicines can thin your blood. Do not take these medicines unless your health care provider tells you to take them.  Taking over-the-counter medicines, vitamins, herbs, and supplements. Tests You may have an exam or testing, such as:  X-rays of the bladder, urethra, or kidneys.  CT scan of the abdomen or pelvis.  Urine tests to check for signs of infection. General instructions  Follow instructions from your health care provider about eating or drinking restrictions.  Ask your health care provider what steps will be taken to help prevent infection. These steps may include: ? Washing skin with a germ-killing soap. ? Taking antibiotic medicine.  Plan to have a responsible adult take you home from the hospital or clinic. What happens during the procedure?  You will be given one or more of the following: ? A medicine to help you relax (sedative). ? A medicine to numb the area (local anesthetic).  The area around the opening of your urethra will be cleaned.  The cystoscope will be passed through your urethra into your bladder.  Germ-free (sterile) fluid will flow through the cystoscope to fill your bladder. The fluid will stretch your bladder so that your health care provider can clearly examine your bladder walls.  Your doctor will look at  the urethra and bladder. Your doctor may take a biopsy or remove stones.  The cystoscope will be removed, and your bladder will be emptied. The procedure may vary among health care providers and hospitals.   What can I expect after the procedure? After the  procedure, it is common to have:  Some soreness or pain in your abdomen and urethra.  Urinary symptoms. These include: ? Mild pain or burning when you urinate. Pain should stop within a few minutes after you urinate. This may last for up to 1 week. ? A small amount of blood in your urine for several days. ? Feeling like you need to urinate but producing only a small amount of urine. Follow these instructions at home: Medicines  Take over-the-counter and prescription medicines only as told by your health care provider.  If you were prescribed an antibiotic medicine, take it as told by your health care provider. Do not stop taking the antibiotic even if you start to feel better. General instructions  Return to your normal activities as told by your health care provider. Ask your health care provider what activities are safe for you.  If you were given a sedative during the procedure, it can affect you for several hours. Do not drive or operate machinery until your health care provider says that it is safe.  Watch for any blood in your urine. If the amount of blood in your urine increases, call your health care provider.  Follow instructions from your health care provider about eating or drinking restrictions.  If a tissue sample was removed for testing (biopsy) during your procedure, it is up to you to get your test results. Ask your health care provider, or the department that is doing the test, when your results will be ready.  Drink enough fluid to keep your urine pale yellow.  Keep all follow-up visits. This is important. Contact a health care provider if:  You have pain that gets worse or does not get better with medicine, especially pain when you urinate.  You have trouble urinating.  You have more blood in your urine. Get help right away if:  You have blood clots in your urine.  You have abdominal pain.  You have a fever or chills.  You are unable to  urinate. Summary  Cystoscopy is a procedure that is used to help diagnose and sometimes treat conditions that affect the lower urinary tract.  Cystoscopy is done using a thin, tube-shaped instrument with a light and camera at the end.  After the procedure, it is common to have some soreness or pain in your abdomen and urethra.  Watch for any blood in your urine. If the amount of blood in your urine increases, call your health care provider.  If you were prescribed an antibiotic medicine, take it as told by your health care provider. Do not stop taking the antibiotic even if you start to feel better. This information is not intended to replace advice given to you by your health care provider. Make sure you discuss any questions you have with your health care provider. Document Revised: 06/24/2020 Document Reviewed: 06/24/2020 Elsevier Patient Education  [place].

## 2021-01-27 NOTE — Telephone Encounter (Signed)
Please contact the patient and inform him that I have reviewed his KUB I do not see evidence of an acute stone episode contributing to his symptoms.  I am still awaiting the final radiology report and will contact him if they see something that I do not.  Additionally, his preferred pharmacy is out of stock on the Uribel I have prescribed.  [name] does have this medication in stock and I have sent a prescription to them.  He may use GoodRx online or on his phone to follow a cost savings coupon for use at that pharmacy.

## 2021-01-27 NOTE — Progress Notes (Signed)
01/27/2021 10:45 AM   Wayne Scott 1951/05/27 [id number]  CC: Chief Complaint  Patient presents with  . Acute Visit    Urinary urgency & frequency   HPI: Wayne Scott is a 70 y.o. male with PMH erectile dysfunction s/p IPP placement with Dr. Guy Sandifer on 04/08/2020, nephrolithiasis, and prostate nodule with normal PSA who presents today for evaluation of ongoing/worsening irritative voiding symptoms.  I saw him in clinic most recently on 01/16/2021 for the same.  I started him on Rapaflo and Ditropan XL at that time for management of chronic prostatitis and subsequently added Bactrim DS twice daily x4 weeks when his symptoms did not begin to improve within 3 days.  Urine culture from that visit finalized with no growth.  Today he reports possibly worsening urinary symptoms despite triple pharmacotherapy.  He reports bothersome nocturia, urgency, hesitancy, intermittency, and dysuria.  He has taken Azo, but does not believe it is really helping.  He clarifies today that this constellation of symptoms has happened to him before in the remote past and typically resolved with Bactrim.  He states he went many years without the symptoms but they returned after his recent IPP placement and seem to be worsening with time.  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  In-office UA today positive for nitrites and orange color; urine microscopy pan negative. PVR 39mL.  PMH: Past Medical History:  Diagnosis Date  . Allergic rhinitis   . Arthritis   . Diabetes mellitus without complication (HCC)   . GERD (gastroesophageal reflux disease)   . Headache   . Heart murmur   . Hypertension   . Macular degeneration   . OSA (obstructive sleep apnea)    uses CPAP  . Psoriasis     Surgical History: Past Surgical History:  Procedure Laterality Date  . CATARACT EXTRACTION    . EXTRACORPOREAL SHOCK WAVE LITHOTRIPSY Left 11/14/2017   Procedure:  EXTRACORPOREAL SHOCK WAVE LITHOTRIPSY (ESWL);  Surgeon: Vanna Scotland, MD;  Location: [facility] ORS;  Service: Urology;  Laterality: Left;  . NASAL SINUS SURGERY    . TONSILLECTOMY    . VASECTOMY      Home Medications:  Allergies as of 01/27/2021      Reactions   Atorvastatin Other (See Comments)   Leg cramps    Pravastatin Other (See Comments)   Leg cramps    Rosuvastatin Other (See Comments)      Medication List       Accurate as of January 27, 2021 10:45 AM. If you have any questions, ask your nurse or doctor.        STOP taking these medications   NONFORMULARY OR COMPOUNDED ITEM Stopped by: Carman Ching, PA-C   NONFORMULARY OR COMPOUNDED ITEM Stopped by: Carman Ching, PA-C     TAKE these medications   acetic acid-hydrocortisone OTIC solution Commonly known as: VOSOL-HC   AMBIEN PO Take 1 tablet by mouth at bedtime as needed.   amiodarone 200 MG tablet Commonly known as: PACERONE Take 200 mg by mouth daily.   amLODipine 10 MG tablet Commonly known as: NORVASC Take 1 tablet by mouth daily.   blood glucose meter kit and supplies Kit Dispense based on patient and insurance preference. Use once daily as directed. (FOR ICD-10 E11.9).   cholecalciferol 1000 units tablet Commonly known as: VITAMIN D Take 1,000 Units by mouth daily.   COSENTYX  Inject 1 application into the skin every 30 (  thirty) days.   Fish Oil 1000 MG Caps Take 4 capsules by mouth daily.   fluticasone 50 MCG/ACT nasal spray Commonly known as: FLONASE Place 2 sprays into both nostrils daily.   latanoprost 0.005 % ophthalmic solution Commonly known as: XALATAN SMARTSIG:In Eye(s)   lovastatin 20 MG tablet Commonly known as: MEVACOR Take 20 mg by mouth daily.   metFORMIN 500 MG tablet Commonly known as: GLUCOPHAGE Take 500 mg by mouth daily.   metoprolol tartrate 50 MG tablet Commonly known as: LOPRESSOR Take 50 mg by mouth in the morning and at bedtime.    multivitamin capsule Take 1 capsule by mouth daily.   omeprazole 20 MG capsule Commonly known as: PRILOSEC TAKE 1 CAPSULE DAILY   oxybutynin 10 MG 24 hr tablet Commonly known as: DITROPAN-XL Take 1 tablet (10 mg total) by mouth daily.   polyethylene glycol powder 17 GM/SCOOP powder Commonly known as: GLYCOLAX/MIRALAX TAKE 17 G BY MOUTH DAILY AS NEEDED FOR MILD CONSTIPATION.   QC TUMERIC COMPLEX PO Take 2 capsules by mouth daily.   silodosin 8 MG Caps capsule Commonly known as: RAPAFLO Take 1 capsule (8 mg total) by mouth daily with breakfast.   sulfamethoxazole-trimethoprim 800-160 MG tablet Commonly known as: BACTRIM DS Take 1 tablet by mouth 2 (two) times daily for 28 days.   SUPER B COMPLEX PO Take 1 tablet by mouth daily.       Allergies:  Allergies  Allergen Reactions  . Atorvastatin Other (See Comments)    Leg cramps   . Pravastatin Other (See Comments)    Leg cramps   . Rosuvastatin Other (See Comments)    Family History: Family History  Problem Relation Age of Onset  . Neuropathy Mother   . Stroke Mother   . Macular degeneration Mother   . Diabetes Father   . Arthritis Other        Parent  . Hypertension Other        Parent  . Diabetes Other        Parent  . Heart attack Brother   . Kidney cancer Brother   . Bladder Cancer Neg Hx   . Prostate cancer Neg Hx     Social History:   reports that he has never smoked. He has never used smokeless tobacco. He reports current alcohol use of about 2.0 standard drinks of alcohol per week. He reports that he does not use drugs.  Physical Exam: BP (!) 157/72   Pulse 75   Ht 6' (1.829 m)   Wt 250 lb (113.4 kg)   BMI 33.91 kg/m   Constitutional:  Alert and oriented, no acute distress, nontoxic appearing HEENT: [place], AT Cardiovascular: No clubbing, cyanosis, or edema Respiratory: Normal respiratory effort, no increased work of breathing Skin: No rashes, bruises or suspicious lesions Neurologic:  Grossly intact, no focal deficits, moving all 4 extremities Psychiatric: Normal mood and affect  Laboratory Data: Results for orders placed or performed in visit on 01/27/21  Microscopic Examination   Urine  Result Value Ref Range   WBC, UA 0-5 0 - 5 /hpf   RBC 0-2 0 - 2 /hpf   Epithelial Cells (non renal) None seen 0 - 10 /hpf   Bacteria, UA Few None seen/Few  Urinalysis, Complete  Result Value Ref Range   Specific Gravity, UA 1.020 1.005 - 1.030   pH, UA 6.0 5.0 - 7.5   Color, UA Orange Yellow   Appearance Ur Clear Clear   Leukocytes,UA Negative Negative  Protein,UA Negative Negative/Trace   Glucose, UA Negative Negative   Ketones, UA Negative Negative   RBC, UA Negative Negative   Bilirubin, UA Negative Negative   Urobilinogen, Ur 0.2 0.2 - 1.0 mg/dL   Nitrite, UA Positive (A) Negative   Microscopic Examination See below:   BLADDER SCAN AMB NON-IMAGING  Result Value Ref Range   Scan Result 47 ml    Assessment & Plan:   1. Chronic prostatitis Symptom stability/worsening on oxybutynin XL, silodosin, and Bactrim.  It does appear that he has had episodic irritative voiding symptoms in the past consistent with chronic prostatitis flares.  He does appear to be emptying his bladder better today.  I counseled him to continue triple therapy and start Uribel as needed for management of his symptoms.  Given symptomatic return after recent IPP placement, I recommend cystoscopy to evaluate for eroded implant versus other bladder pathologies.  We will add this onto his upcoming annual follow-up appointment with Dr. Erlene Quan.  Patient is in agreement with this plan. - Urinalysis, Complete - BLADDER SCAN AMB NON-IMAGING - Meth-Hyo-M Bl-Na Phos-Ph Sal (URIBEL) 118 MG CAPS; Take 1 capsule (118 mg total) by mouth 4 (four) times daily as needed.  Dispense: 30 capsule; Refill: 3  2. History of nephrolithiasis Low suspicion for acute stone episode given benign UA without microscopic hematuria,  however given his stone history, will obtain KUB today to rule this out as contributory to his irritative voiding symptoms. - DG Abd 1 View; Future   Return in about 1 week (around 02/03/2021) for Annual visit with Dr. Erlene Quan with DRE + PSA + cysto.  Debroah Loop, PA-C  [facility] [address] [place], [place] [address] [phone]

## 2021-01-30 ENCOUNTER — Encounter: Payer: Self-pay | Admitting: *Deleted

## 2021-01-30 NOTE — Telephone Encounter (Signed)
Pt read [facility] message  Last read by Elissa Hefty at 9:17 AM on 01/30/2021.

## 2021-01-30 NOTE — Telephone Encounter (Signed)
Sent pt [facility] message with results

## 2021-02-02 ENCOUNTER — Ambulatory Visit: Payer: Self-pay | Admitting: Urology

## 2021-02-03 ENCOUNTER — Other Ambulatory Visit: Payer: Self-pay

## 2021-02-03 ENCOUNTER — Ambulatory Visit (INDEPENDENT_AMBULATORY_CARE_PROVIDER_SITE_OTHER): Payer: Medicare Other | Admitting: Urology

## 2021-02-03 ENCOUNTER — Encounter: Payer: Self-pay | Admitting: Urology

## 2021-02-03 ENCOUNTER — Other Ambulatory Visit
Admission: RE | Admit: 2021-02-03 | Discharge: 2021-02-03 | Disposition: A | Discharge status: Home / Self Care | Payer: Medicare Other | Attending: Urology | Admitting: Urology

## 2021-02-03 ENCOUNTER — Other Ambulatory Visit: Payer: Self-pay | Admitting: *Deleted

## 2021-02-03 VITALS — BP 137/72 | HR 75 | Ht 72.0 in | Wt 250.0 lb

## 2021-02-03 DIAGNOSIS — Z87442 Personal history of urinary calculi: Secondary | ICD-10-CM

## 2021-02-03 DIAGNOSIS — N529 Male erectile dysfunction, unspecified: Secondary | ICD-10-CM

## 2021-02-03 DIAGNOSIS — N402 Nodular prostate without lower urinary tract symptoms: Secondary | ICD-10-CM

## 2021-02-03 DIAGNOSIS — N411 Chronic prostatitis: Secondary | ICD-10-CM | POA: Diagnosis not present

## 2021-02-03 LAB — URINALYSIS, COMPLETE (UACMP) WITH MICROSCOPIC
Glucose, UA: NEGATIVE mg/dL
Hgb urine dipstick: NEGATIVE
Ketones, ur: NEGATIVE mg/dL
Leukocytes,Ua: NEGATIVE
Nitrite: NEGATIVE
Protein, ur: NEGATIVE mg/dL
RBC / HPF: NONE SEEN RBC/hpf (ref 0–5)
Specific Gravity, Urine: 1.02 (ref 1.005–1.030)
pH: 5.5 (ref 5.0–8.0)

## 2021-02-05 NOTE — Progress Notes (Signed)
   02/03/21 CC:  Chief Complaint  Patient presents with  . Cysto    HPI: 70 year old male with multiple GU issues who presents today for cystoscopic evaluation.  He has a known history of nephrolithiasis, refractory erectile dysfunction status post IPP with Dr. Alphonzo Severance in 03/2020 as well as a prostate nodule with normal/stable PSA who presents today for further evaluation of irritative voiding symptoms.  He does have a history of chronic intermittent prostatitis.  Please see previous notes for details.  Blood pressure 137/72, pulse 75, height 6' (1.829 m), weight 250 lb (113.4 kg). NED. A&Ox3.   No respiratory distress   Abd soft, NT, ND Normal phallus with bilateral descended testicles  Cystoscopy Procedure Note  Patient identification was confirmed, informed consent was obtained, and patient was prepped using Betadine solution.  Lidocaine jelly was administered per urethral meatus.     Pre-Procedure: - Inspection reveals a normal caliber ureteral meatus.  Procedure: The flexible cystoscope was introduced without difficulty - No urethral strictures/lesions are present although there was some slight angulation at the base of his bulbar urethra prior to reaching the membranous urethra likely secondary to the penile prosthesis without evidence of erosion - Enlarged prostate bilobar coaptation - Minimally elevated bladder neck - Bilateral ureteral orifices identified - Bladder mucosa  reveals no ulcers, tumors, or lesions although visualization was somewhat limited due to the blue discoloration of his urine from Urogesic-Blue - No bladder stones -Mild trabeculation  Unable to visualize bladder neck on retroflexion due to poor visualization   Post-Procedure: - Patient tolerated the procedure well  Assessment/ Plan:  1. Chronic prostatitis Chronic irritative voiding symptoms with underlying BPH  He briefly mention today that he has been doing some reading about UroLift and  wonders if he is a candidate if this would help his symptoms.  We discussed that typically it helps with obstructive urinary symptoms but can also help with irritative voiding symptoms although would not like to pursue this during any sort of acute prostatitis episode.  He like to return for prostate sizing to see if he is a candidate.  He was given literature about the procedure.  2. History of nephrolithiasis Most recent KUB is unremarkable  3. Prostate nodule PSA remains stable/low  4. Erectile dysfunction unresponsive to phosphodiesterase type 5 inhibitor Status post IPP without evidence of erosion or malfunction today   Return for TRUS  Hollice Espy, MD

## 2021-02-06 DIAGNOSIS — H353211 Exudative age-related macular degeneration, right eye, with active choroidal neovascularization: Secondary | ICD-10-CM | POA: Diagnosis not present

## 2021-02-07 ENCOUNTER — Other Ambulatory Visit: Payer: Self-pay | Admitting: Physician Assistant

## 2021-02-07 DIAGNOSIS — Z9889 Other specified postprocedural states: Secondary | ICD-10-CM | POA: Diagnosis not present

## 2021-02-07 DIAGNOSIS — Z8679 Personal history of other diseases of the circulatory system: Secondary | ICD-10-CM | POA: Diagnosis not present

## 2021-02-07 DIAGNOSIS — R931 Abnormal findings on diagnostic imaging of heart and coronary circulation: Secondary | ICD-10-CM | POA: Diagnosis not present

## 2021-02-07 DIAGNOSIS — R918 Other nonspecific abnormal finding of lung field: Secondary | ICD-10-CM | POA: Diagnosis not present

## 2021-02-07 DIAGNOSIS — R42 Dizziness and giddiness: Secondary | ICD-10-CM | POA: Diagnosis not present

## 2021-02-07 DIAGNOSIS — J479 Bronchiectasis, uncomplicated: Secondary | ICD-10-CM | POA: Diagnosis not present

## 2021-02-07 DIAGNOSIS — M25512 Pain in left shoulder: Secondary | ICD-10-CM | POA: Diagnosis not present

## 2021-02-07 DIAGNOSIS — Z48812 Encounter for surgical aftercare following surgery on the circulatory system: Secondary | ICD-10-CM | POA: Diagnosis not present

## 2021-02-07 DIAGNOSIS — I480 Paroxysmal atrial fibrillation: Secondary | ICD-10-CM | POA: Diagnosis not present

## 2021-02-07 DIAGNOSIS — I7101 Dissection of thoracic aorta: Secondary | ICD-10-CM | POA: Diagnosis not present

## 2021-02-07 DIAGNOSIS — N411 Chronic prostatitis: Secondary | ICD-10-CM

## 2021-02-07 DIAGNOSIS — I517 Cardiomegaly: Secondary | ICD-10-CM | POA: Diagnosis not present

## 2021-02-07 DIAGNOSIS — I77810 Thoracic aortic ectasia: Secondary | ICD-10-CM | POA: Diagnosis not present

## 2021-02-09 ENCOUNTER — Other Ambulatory Visit: Payer: Self-pay

## 2021-02-09 ENCOUNTER — Encounter: Payer: Self-pay | Admitting: *Deleted

## 2021-02-09 ENCOUNTER — Encounter: Payer: Medicare Other | Attending: Infectious Diseases | Admitting: *Deleted

## 2021-02-09 ENCOUNTER — Encounter: Payer: Self-pay | Admitting: Urology

## 2021-02-09 VITALS — Wt 258.5 lb

## 2021-02-09 DIAGNOSIS — E119 Type 2 diabetes mellitus without complications: Secondary | ICD-10-CM | POA: Insufficient documentation

## 2021-02-09 NOTE — Progress Notes (Signed)

## 2021-02-10 ENCOUNTER — Encounter: Payer: Self-pay | Admitting: Urology

## 2021-02-11 ENCOUNTER — Encounter: Payer: Self-pay | Admitting: Urology

## 2021-02-12 ENCOUNTER — Other Ambulatory Visit: Payer: Self-pay | Admitting: Physician Assistant

## 2021-02-12 DIAGNOSIS — N411 Chronic prostatitis: Secondary | ICD-10-CM

## 2021-02-14 ENCOUNTER — Encounter: Payer: Self-pay | Admitting: *Deleted

## 2021-02-15 ENCOUNTER — Encounter: Payer: Self-pay | Admitting: Urology

## 2021-02-15 ENCOUNTER — Ambulatory Visit (INDEPENDENT_AMBULATORY_CARE_PROVIDER_SITE_OTHER): Payer: Medicare Other | Admitting: Urology

## 2021-02-15 ENCOUNTER — Other Ambulatory Visit: Payer: Self-pay

## 2021-02-15 VITALS — BP 149/79 | HR 67

## 2021-02-15 DIAGNOSIS — R3911 Hesitancy of micturition: Secondary | ICD-10-CM

## 2021-02-15 DIAGNOSIS — N401 Enlarged prostate with lower urinary tract symptoms: Secondary | ICD-10-CM | POA: Diagnosis not present

## 2021-02-15 NOTE — Progress Notes (Signed)
02/15/21  Chief Complaint  Patient presents with  . TRUS     HPI: 70 year old male with refractory urinary symptoms related to BPH who presents today to the office for prostate sizing and further discussion of possible outlet procedure.  Please see previous note from [facility] on 01/27/2021.  Essentially, the patient has been struggling with urinary symptoms, both obstructive as well as storage related for quite some time but had 2 recent exacerbations, once following his IPP placement and then again at the time of aortic dissection.  He has been trialed on multiple medications including Rapaflo, Ditropan as well as treated for presumed possible prostatitis for 4-week course of antibiotics.  This helped somewhat but his symptoms have not completely resolved.  Primary symptoms include urgency, hesitancy, difficulty starting a stream, weak stream, intermittency and occasional dysuria although he does not endorse this today.  Cystoscopy indicated slight prostamegaly with bilobar coaptation and mildly elevated bladder neck with trabeculation.   Please see previous notes for details.     Blood pressure (!) 149/79, pulse 67. NED. A&Ox3.   No respiratory distress   Abd soft, NT, ND   Prostate transrectal ultrasound sizing   Informed consent was obtained after discussing risks/benefits of the procedure.  A time out was performed to ensure correct patient identity.   Pre-Procedure: -Transrectal probe was placed without difficulty -Transrectal Ultrasound performed revealing a 37.9 gm prostate measuring 2.84 x 4.48 x 5.68 cm (length) -No significant hypoechoic or median lobe noted  although there is a notable component of the prostate noted with an elevated bladder neck but no discrete median lobe.     Assessment/ Plan:  1. Benign prostatic hyperplasia with urinary hesitancy See discussion today about various treatment options, previously tried and failed multiple  medications  He does have bladder trabeculation and a mildly large prostate.  May benefit from consideration of an outlet procedure.  We discussed various options including laser ablation, TURP, and in UroLift.  He is most interested in [place].  He he is already educated himself on the procedure and has watched videos.  Discussed possible risk including failure to improve his urinary symptoms, infection, retention, clip migration or complications, amongst others.  All questions answered.  He like to pursue this.   Hollice Espy, MD   I spent 30 total minutes on the day of the encounter including pre-visit review of the medical record, face-to-face time with the patient, and post visit ordering of labs/imaging/tests.

## 2021-02-15 NOTE — H&P (View-Only) (Signed)
02/15/21  Chief Complaint  Patient presents with  . TRUS     HPI: 70 year old male with refractory urinary symptoms related to BPH who presents today to the office for prostate sizing and further discussion of possible outlet procedure.  Please see previous note from [facility] on 01/27/2021.  Essentially, the patient has been struggling with urinary symptoms, both obstructive as well as storage related for quite some time but had 2 recent exacerbations, once following his IPP placement and then again at the time of aortic dissection.  He has been trialed on multiple medications including Rapaflo, Ditropan as well as treated for presumed possible prostatitis for 4-week course of antibiotics.  This helped somewhat but his symptoms have not completely resolved.  Primary symptoms include urgency, hesitancy, difficulty starting a stream, weak stream, intermittency and occasional dysuria although he does not endorse this today.  Cystoscopy indicated slight prostamegaly with bilobar coaptation and mildly elevated bladder neck with trabeculation.   Please see previous notes for details.     Blood pressure (!) 149/79, pulse 67. NED. A&Ox3.   No respiratory distress   Abd soft, NT, ND   Prostate transrectal ultrasound sizing   Informed consent was obtained after discussing risks/benefits of the procedure.  A time out was performed to ensure correct patient identity.   Pre-Procedure: -Transrectal probe was placed without difficulty -Transrectal Ultrasound performed revealing a 37.9 gm prostate measuring 2.84 x 4.48 x 5.68 cm (length) -No significant hypoechoic or median lobe noted  although there is a notable component of the prostate noted with an elevated bladder neck but no discrete median lobe.     Assessment/ Plan:  1. Benign prostatic hyperplasia with urinary hesitancy See discussion today about various treatment options, previously tried and failed multiple  medications  He does have bladder trabeculation and a mildly large prostate.  May benefit from consideration of an outlet procedure.  We discussed various options including laser ablation, TURP, and in UroLift.  He is most interested in [place].  He he is already educated himself on the procedure and has watched videos.  Discussed possible risk including failure to improve his urinary symptoms, infection, retention, clip migration or complications, amongst others.  All questions answered.  He like to pursue this.   Hollice Espy, MD   I spent 30 total minutes on the day of the encounter including pre-visit review of the medical record, face-to-face time with the patient, and post visit ordering of labs/imaging/tests.

## 2021-02-17 ENCOUNTER — Other Ambulatory Visit: Payer: Self-pay | Admitting: Urology

## 2021-02-17 DIAGNOSIS — N401 Enlarged prostate with lower urinary tract symptoms: Secondary | ICD-10-CM

## 2021-02-23 DIAGNOSIS — H6063 Unspecified chronic otitis externa, bilateral: Secondary | ICD-10-CM | POA: Diagnosis not present

## 2021-02-24 ENCOUNTER — Other Ambulatory Visit: Payer: Self-pay

## 2021-02-24 DIAGNOSIS — N401 Enlarged prostate with lower urinary tract symptoms: Secondary | ICD-10-CM

## 2021-02-24 DIAGNOSIS — Z87442 Personal history of urinary calculi: Secondary | ICD-10-CM

## 2021-02-24 DIAGNOSIS — R3911 Hesitancy of micturition: Secondary | ICD-10-CM

## 2021-03-02 ENCOUNTER — Other Ambulatory Visit: Payer: Self-pay

## 2021-03-02 ENCOUNTER — Other Ambulatory Visit: Payer: Medicare Other

## 2021-03-02 DIAGNOSIS — N401 Enlarged prostate with lower urinary tract symptoms: Secondary | ICD-10-CM

## 2021-03-02 DIAGNOSIS — Z87442 Personal history of urinary calculi: Secondary | ICD-10-CM

## 2021-03-02 DIAGNOSIS — R3911 Hesitancy of micturition: Secondary | ICD-10-CM

## 2021-03-02 LAB — MICROSCOPIC EXAMINATION
Bacteria, UA: NONE SEEN
RBC, Urine: NONE SEEN /hpf (ref 0–2)
WBC, UA: NONE SEEN /hpf (ref 0–5)

## 2021-03-02 LAB — URINALYSIS, COMPLETE
Bilirubin, UA: NEGATIVE
Glucose, UA: NEGATIVE
Ketones, UA: NEGATIVE
Leukocytes,UA: NEGATIVE
Nitrite, UA: NEGATIVE
Protein,UA: NEGATIVE
Specific Gravity, UA: 1.02 (ref 1.005–1.030)
Urobilinogen, Ur: 0.2 mg/dL (ref 0.2–1.0)
pH, UA: 6 (ref 5.0–7.5)

## 2021-03-07 ENCOUNTER — Other Ambulatory Visit: Payer: Self-pay

## 2021-03-07 ENCOUNTER — Other Ambulatory Visit
Admission: RE | Admit: 2021-03-07 | Discharge: 2021-03-07 | Disposition: A | Discharge status: Home / Self Care | Payer: Medicare Other | Source: Ambulatory Visit | Attending: Urology | Admitting: Urology

## 2021-03-07 HISTORY — DX: Personal history of urinary calculi: Z87.442

## 2021-03-07 NOTE — Patient Instructions (Addendum)
Your procedure is scheduled on: Monday March 13, 2021 Report to Day Surgery inside [facility] 2nd floor (stop by admissions desk first before getting on elevator). To find out your arrival time please call [phone] between 1PM - 3PM on Friday March 10, 2021.  Remember: Instructions that are not followed completely may result in serious medical risk,  up to and including death, or upon the discretion of your surgeon and anesthesiologist your  surgery may need to be rescheduled.     _X__ 1. Do not eat food or drink fluids after midnight the night before your procedure.                 No chewing gum or hard candies.   __X__2.  On the morning of surgery brush your teeth with toothpaste and water, you                may rinse your mouth with mouthwash if you wish.  Do not swallow any toothpaste of mouthwash.     _X__ 3.  No Alcohol for 24 hours before or after surgery.   _X__ 4.  Do Not Smoke or use e-cigarettes For 24 Hours Prior to Your Surgery.                 Do not use any chewable tobacco products for at least 6 hours prior to                 Surgery.  _X__  5.  Do not use any recreational drugs (marijuana, cocaine, heroin, ecstasy, MDMA or other)                For at least one week prior to your surgery.  Combination of these drugs with anesthesia                May have life threatening results.  __X__ 6.  Notify your doctor if there is any change in your medical condition      (cold, fever, infections).     Do not wear jewelry, make-up, hairpins, clips or nail polish. Do not wear lotions, powders, or perfumes. You may wear deodorant. Do not shave 48 hours prior to surgery. Men may shave face and neck. Do not bring valuables to the hospital.    [facility] is not responsible for any belongings or valuables.  Contacts, dentures or bridgework may not be worn into surgery. Leave your suitcase in the car. After surgery it may be brought to your  room. For patients admitted to the hospital, discharge time is determined by your treatment team.   Patients discharged the day of surgery will not be allowed to drive home.   Make arrangements for someone to be with you for the first 24 hours of your Same Day Discharge.   __X__ Take these medicines the morning of surgery with A SIP OF WATER:    1. amiodarone (PACERONE) 200 MG   2. amLODipine (NORVASC) 10 MG   3. omeprazole (PRILOSEC) 20 MG   4. metoprolol tartrate (LOPRESSOR) 50 MG   5.  6.  ____ Fleet Enema (as directed)   ____ Use CHG Soap (or wipes) as directed  ____ Use Benzoyl Peroxide Gel as instructed  ____ Use inhalers on the day of surgery  __X__ Stop metFORMIN (GLUCOPHAGE) 500 MG  2 days prior to surgery (last dose will be Friday 03/10/21)    ____ Take 1/2 of usual insulin dose the night before surgery. No  insulin the morning          of surgery.   __X__One week prior to surgery:  Stop anti-inflammatories (NSAIDS) such as Advil, Aleve, Ibuprofen, Motrin, naproxen, Naprosyn and Aspirin based products such as Excedrin, Goody;s BC  Powder- OK TO TAKE TYLENOL IF NEEDED.   ___X_Stop ANY OVER THE COUNTER supplements until after surgery-Turmeric (QC TURMERIC COMPLEX, Omega-3 Fatty Acids (FISH OIL) 1000 MG -  HOWEVER YOU MAY CONTINUE YOUR VITAMIN  D UP TO THE DAY PRIOR TO SURGERY  __X__ Bring C-Pap to the hospital.     Surgery Visitation Policy:  Patients undergoing a surgery or procedure may have one family member or support person with them as long as that person is not COVID-19 positive or experiencing its symptoms.  That person may remain in the waiting area during the procedure.   Inpatient Visitation:   Visiting hours are 7 a.m. to 8 p.m. Inpatients will be allowed two visitors daily. The visitors may change each day during the patient's stay. No visitors under the age of 22. Any visitors under the age of 66 must be accompanied by an adult. The visitors must  pass COVID-19 screening, use hand sanitizer when entering and exiting the patient's room and wear mask at all times, including the patient's room.  Masking is required regardless of vaccination status.  If you have any questions regarding your pre-procedure instructions,  Please call Pre-admit Testing at [phone]

## 2021-03-08 ENCOUNTER — Encounter: Payer: Self-pay | Admitting: Urology

## 2021-03-08 NOTE — Progress Notes (Signed)
Perioperative Services  Pre-Admission/Anesthesia Testing Clinical Review  Date: 03/09/21  Patient Demographics:  Name: Wayne Scott DOB:   1951-08-10 MRN:   [id number]  Planned Surgical Procedure(s):    Case: [id number] Date/Time: 03/13/21 0920   Procedure: CYSTOSCOPY WITH INSERTION OF UROLIFT (N/A )   Anesthesia type: Choice   Pre-op diagnosis: BPH with urinary hesitancy   Location: [facility] OR ROOM 10 / [place] [facility]   Surgeons: Hollice Espy, MD    NOTE: Available PAT nursing documentation and vital signs have been reviewed. Clinical nursing staff has updated patient's PMH/PSHx, current medication list, and drug allergies/intolerances to ensure comprehensive history available to assist in medical decision making as it pertains to the aforementioned surgical procedure and anticipated anesthetic course.   Clinical Discussion:  Wayne Scott is a 70 y.o. male who is submitted for pre-surgical anesthesia review and clearance prior to him undergoing the above procedure. Patient has never been a smoker. Pertinent PMH includes: paroxysmal atrial fibrillation, AAA with dissection, cardiac murmur, HTN, HLD, T2DM, OSAH (requires nocturnal PAP therapy, GERD (on daily PPI), OA, anxiety.  Patient is followed by cardiology Nehemiah Massed, MD). He was last seen in the cardiology clinic on 12/14/2020; notes reviewed.  At the time of his clinic visit, patient noted to be doing well overall from a cardiovascular perspective. He denied chest pain, shortness of breath, orthopnea, PND, palpitations, peripheral edema, vertiginous symptoms, and presyncope/syncope.  PMH (+) for AAA with dissection that occurred in 07/2020.  Patient underwent ascending aortic graft placement at [facility].  He continues to be followed by CVTS Wayne Norman, MD).  Patient also has a history of paroxysmal atrial fibrillation. CHA2DS2-VASc Score = 4 (age, HTN, vascular disease/aortic plaque, T2DM).  Rate and rhythm controlled on  antiarrhythmic and beta-blocker therapies.  Patient currently not chronically anticoagulated; cites cost as constraint.  Last TTE performed on 02/07/2021 revealed normal left ventricular systolic function with moderate LVH, trivial to mild valvular regurgitation, and an ectatic aortic root and proximal aorta; LVEF >55% (see full interpretation of cardiovascular testing below). Patient on GDMT for his HTN diagnosis.  Blood pressure well controlled at 120/70 on currently prescribed CCB and beta-blocker therapies.  Patient was started on a statin for his HLD. T2DM well controlled on currently prescribed regimen; Hgb A1c 6.6% when last checked on 12/01/2020. Functional capacity, as defined by DASI, is documented as being >/= 4 METS.  No changes were made to patient's medication regimen.  Patient to follow-up with outpatient cardiology in 4 months or sooner if needed.  Patient scheduled to undergo a urological procedure on 03/13/2021 with Dr. Hollice Espy.  Given patient's past medical history significant for cardiovascular disease and intervention, presurgical cardiac clearance was sought by the performing surgeon's office and PAT team. Per cardiology, "this patient is optimized for surgery and may proceed with the planned procedural course with a LOW risk stratification".  This patient is on daily antiplatelet therapy.  Performing surgeon is okay with patient continuing his daily low-dose ASA throughout the perioperative period, and patient is aware.  Patient denies previous perioperative complications with anesthesia in the past. In review of the available records, it is noted that patient underwent a general anesthetic course at [facility] ([facility] in 07/2020 without documented complications.   Vitals with BMI 03/07/2021 02/15/2021 02/09/2021  Height $Remov'6\' 0"'AAzlFK$  - -  Weight 250 lbs - 258 lbs 8 oz  BMI 98.3 - 38.25  Systolic - 053 -  Diastolic - 79 -  Pulse -  67 -    Providers/Specialists:   NOTE: Primary  physician provider listed below. Patient may have been seen by APP or partner within same practice.   PROVIDER ROLE / SPECIALTY LAST Lu Duffel, MD Urology (Surgeon)  02/15/2021  Leonel Ramsay, MD Primary Care Provider  12/09/2020  Serafina Royals, MD Cardiology  12/14/2020  Florinda Marker, MD Cardiothoracic Surgery  02/07/2021   Allergies:  Atorvastatin, Pravastatin, and Rosuvastatin  Current Home Medications:   No current facility-administered medications for this encounter.   Marland Kitchen acetaminophen (TYLENOL) 500 MG tablet  . acetic acid-hydrocortisone (VOSOL-HC) OTIC solution  . amiodarone (PACERONE) 200 MG tablet  . amLODipine (NORVASC) 10 MG tablet  . B Complex-C (SUPER B COMPLEX PO)  . cholecalciferol (VITAMIN D) 1000 units tablet  . fluticasone (FLONASE) 50 MCG/ACT nasal spray  . latanoprost (XALATAN) 0.005 % ophthalmic solution  . metFORMIN (GLUCOPHAGE) 500 MG tablet  . Meth-Hyo-M Bl-Na Phos-Ph Sal (URIBEL) 118 MG CAPS  . methotrexate (RHEUMATREX) 2.5 MG tablet  . metoprolol tartrate (LOPRESSOR) 50 MG tablet  . Multiple Vitamin (MULTIVITAMIN) capsule  . Multiple Vitamins-Minerals (PRESERVISION AREDS 2 PO)  . naproxen sodium (ALEVE) 220 MG tablet  . Omega-3 Fatty Acids (FISH OIL) 1000 MG CAPS  . omeprazole (PRILOSEC) 20 MG capsule  . oxybutynin (DITROPAN-XL) 10 MG 24 hr tablet  . polyethylene glycol powder (GLYCOLAX/MIRALAX) powder  . Secukinumab (COSENTYX Orient)  . silodosin (RAPAFLO) 8 MG CAPS capsule  . Turmeric (QC TUMERIC COMPLEX PO)  . zolpidem (AMBIEN) 5 MG tablet  . blood glucose meter kit and supplies KIT   History:   Past Medical History:  Diagnosis Date  . Allergic rhinitis   . Anxiety   . Aortic aneurysm and dissection ([place]) 07/2020  . Arthritis   . Diabetes mellitus without complication ([place])   . GERD (gastroesophageal reflux disease)   . Headache   . Heart murmur   . History of kidney stones   . HLD (hyperlipidemia)   . Hypertension   .  Macular degeneration   . OSA on CPAP   . Paroxysmal atrial fibrillation ([facility])   . Psoriasis    Past Surgical History:  Procedure Laterality Date  . ASCENDING AORTA GRAFT WITH CARDIOPULMONARY BYPASS; VALVE SUSPENSION WITH CORONARY RECONSTRUCTION AND VALVE-SPARING AORTIC ROOT REMODELING (DAVID PROCEDURE, YACOUB PROCEDURE)  08/11/2020   performed at [place]    . EXTRACORPOREAL SHOCK WAVE LITHOTRIPSY Left 11/14/2017   Procedure: EXTRACORPOREAL SHOCK WAVE LITHOTRIPSY (ESWL);  Surgeon: Hollice Espy, MD;  Location: [facility] ORS;  Service: Urology;  Laterality: Left;  . EYE SURGERY    . NASAL SINUS SURGERY  1991  . PENILE PROSTHESIS IMPLANT  02/2020  . TONSILLECTOMY    . VASECTOMY     Family History  Problem Relation Age of Onset  . Neuropathy Mother   . Stroke Mother   . Macular degeneration Mother   . Diabetes Father   . Arthritis Other        Parent  . Hypertension Other        Parent  . Diabetes Other        Parent  . Heart attack Brother   . Kidney cancer Brother   . Bladder Cancer Neg Hx   . Prostate cancer Neg Hx    Social History   Tobacco Use  . Smoking status: Never Smoker  . Smokeless tobacco: Never Used  Substance Use Topics  . Alcohol use: Yes    Alcohol/week:  2.0 standard drinks    Types: 1 Cans of beer, 1 Shots of liquor per week    Comment: once or twice a month  . Drug use: No    Pertinent Clinical Results:  LABS: Labs reviewed: Acceptable for surgery.      No visits with results within 3 Day(s) from this visit.  Latest known visit with results is:  Appointment on 03/02/2021  Component Date Value Ref Range Status  . Urine Culture, Comprehensive 03/02/2021 Final report   Final  . Organism ID, Bacteria 03/02/2021 Comment   Final   Comment: Mixed urogenital flora 10,000-25,000 colony forming units per mL   . Specific Gravity, UA 03/02/2021 1.020  1.005 - 1.030 Final  . pH, UA 03/02/2021 6.0  5.0 - 7.5 Final  . Color,  UA 03/02/2021 Green* Yellow Final  . Appearance Ur 03/02/2021 Clear  Clear Final  . Chalmers Guest 03/02/2021 Negative  Negative Final  . Protein,UA 03/02/2021 Negative  Negative/Trace Final  . Glucose, UA 03/02/2021 Negative  Negative Final  . Ketones, UA 03/02/2021 Negative  Negative Final  . RBC, UA 03/02/2021 Trace* Negative Final  . Bilirubin, UA 03/02/2021 Negative  Negative Final  . Urobilinogen, Ur 03/02/2021 0.2  0.2 - 1.0 mg/dL Final  . Nitrite, UA 03/02/2021 Negative  Negative Final  . Microscopic Examination 03/02/2021 See below:   Final  . WBC, UA 03/02/2021 None seen  0 - 5 /hpf Final  . RBC 03/02/2021 None seen  0 - 2 /hpf Final  . Epithelial Cells (non renal) 03/02/2021 0-10  0 - 10 /hpf Final  . Mucus, UA 03/02/2021 Present  Not Estab. Final  . Bacteria, UA 03/02/2021 None seen  None seen/Few Final    ECG: Date: 03/08/2021 Time ECG obtained: 0904 AM Rate: 70 bpm Rhythm: normal sinus Axis (leads I and aVF): Normal Intervals: PR 196 ms. QRS 108 ms. QTc 442 ms. ST segment and T wave changes: Nonspecific ST and T wave abnormalities  Comparison: Similar to previous tracing obtained on 08/11/2020   IMAGING / PROCEDURES: ECHOCARDIOGRAM performed on 02/07/2021 1. LVEF >55% 2. Normal left ventricular systolic function with moderate LVH 3. Normal LA pressures with normal diastolic function 4. Normal right ventricular systolic function 5. Trivial AR, MR, and TR 6. Mild PR 7. No valvular stenosis 8. 32 mm x 30 cm TERUMO graft in situ within ascending aorta 1-2 above the S-T junction 9. Dilated aortic root and proximal aorta 10. No evidence of pericardial effusion  CT TAA ANGIOGRAM PROTOCOL INCLUDING CTA CHEST ABDOMEN PELVIS WITH/WITHOUT performed on 02/07/2021 1. Prior graft repair of the ascending thoracic aorta with no evidence of complication.  2. Stable extent of residual dissection flap and unchanged dilation of the aortic arch and descending thoracic aorta.   3. Unchanged dilation of the aortic root measuring up to 4.8 cm. 4. Lower lobe reticular opacities with associated mild traction bronchiectasis and bronchial ectasis, findings can be seen in the setting of interstitial lung abnormality. Pulmonary function tests could be performed to assess for interstitial lung disease if there is clinical concern.   TRANSESOPHAGEAL ECHOCARDIOGRAM performed on 08/11/2020 1. Pre TEE:   Normal BiV function, severely dilated ascending aorta with dissection flap extending from above coronary ostia to descending aorta.   False lumen occupies >50% of ascending aorta.   Coronary ostia patent.   Severe AI due to dissection flap impinging on the aortic valve in diastole, trace MR, trace TR, trace PI. No PFO.   2. Post  TEE:   Supracoronary ascending aorta replaced by graft.   Flow noticed in both coronary ostia.   Mild central AI of the native resuspended aortic valve.   Mild-moderate TR with dilated tricuspid annulus as RV mild-moderately dilated, but preserved function - TAPSE 2.3cm on epi 0.02-0.04 mcg/kg/min.   LV hyperdynamic with low LVEDD.   No other changes.    Impression and Plan:  AZAVIER CRESON has been referred for pre-anesthesia review and clearance prior to him undergoing the planned anesthetic and procedural courses. Available labs, pertinent testing, and imaging results were personally reviewed by me. This patient has been appropriately cleared by cardiology with an overall LOW risk of significant perioperative cardiovascular complications.  Based on clinical review performed today (03/09/21), barring any significant acute changes in the patient's overall condition, it is anticipated that he will be able to proceed with the planned surgical intervention. Any acute changes in clinical condition may necessitate his procedure being postponed and/or cancelled. Pre-surgical instructions were reviewed with the patient during his PAT appointment and  questions were fielded by PAT clinical staff.  Honor Loh, MSN, APRN, FNP-C, CEN [facility]  Peri-operative Services Nurse Practitioner Phone: [phone] 03/09/21 9:56 AM  NOTE: This note has been prepared using Dragon dictation software. Despite my best ability to proofread, there is always the potential that unintentional transcriptional errors may still occur from this process.

## 2021-03-09 ENCOUNTER — Other Ambulatory Visit: Payer: Medicare Other

## 2021-03-09 ENCOUNTER — Other Ambulatory Visit
Admission: RE | Admit: 2021-03-09 | Discharge: 2021-03-09 | Disposition: A | Discharge status: Home / Self Care | Payer: Medicare Other | Source: Ambulatory Visit | Attending: Urology | Admitting: Urology

## 2021-03-09 ENCOUNTER — Other Ambulatory Visit: Payer: Self-pay

## 2021-03-09 DIAGNOSIS — I10 Essential (primary) hypertension: Secondary | ICD-10-CM | POA: Diagnosis not present

## 2021-03-09 DIAGNOSIS — Z01818 Encounter for other preprocedural examination: Secondary | ICD-10-CM | POA: Diagnosis not present

## 2021-03-09 DIAGNOSIS — Z01810 Encounter for preprocedural cardiovascular examination: Secondary | ICD-10-CM | POA: Diagnosis not present

## 2021-03-09 DIAGNOSIS — Z20822 Contact with and (suspected) exposure to covid-19: Secondary | ICD-10-CM | POA: Insufficient documentation

## 2021-03-09 LAB — CULTURE, URINE COMPREHENSIVE

## 2021-03-09 LAB — SARS CORONAVIRUS 2 (TAT 6-24 HRS): SARS Coronavirus 2: NEGATIVE

## 2021-03-13 ENCOUNTER — Other Ambulatory Visit: Payer: Self-pay

## 2021-03-13 ENCOUNTER — Encounter
Admission: RE | Disposition: A | Discharge status: Home / Self Care | Payer: Self-pay | Source: Home / Self Care | Attending: Urology

## 2021-03-13 ENCOUNTER — Other Ambulatory Visit: Payer: Self-pay | Admitting: Physician Assistant

## 2021-03-13 ENCOUNTER — Encounter: Payer: Self-pay | Admitting: Urology

## 2021-03-13 ENCOUNTER — Ambulatory Visit
Admission: RE | Admit: 2021-03-13 | Discharge: 2021-03-13 | Disposition: A | Discharge status: Home / Self Care | Payer: Medicare Other | Attending: Urology | Admitting: Urology

## 2021-03-13 ENCOUNTER — Ambulatory Visit: Payer: Medicare Other | Admitting: Urgent Care

## 2021-03-13 DIAGNOSIS — N3289 Other specified disorders of bladder: Secondary | ICD-10-CM | POA: Diagnosis not present

## 2021-03-13 DIAGNOSIS — N401 Enlarged prostate with lower urinary tract symptoms: Secondary | ICD-10-CM | POA: Diagnosis not present

## 2021-03-13 DIAGNOSIS — I10 Essential (primary) hypertension: Secondary | ICD-10-CM | POA: Diagnosis not present

## 2021-03-13 DIAGNOSIS — E119 Type 2 diabetes mellitus without complications: Secondary | ICD-10-CM | POA: Diagnosis not present

## 2021-03-13 DIAGNOSIS — I480 Paroxysmal atrial fibrillation: Secondary | ICD-10-CM | POA: Insufficient documentation

## 2021-03-13 DIAGNOSIS — Z791 Long term (current) use of non-steroidal anti-inflammatories (NSAID): Secondary | ICD-10-CM | POA: Diagnosis not present

## 2021-03-13 DIAGNOSIS — R3911 Hesitancy of micturition: Secondary | ICD-10-CM | POA: Insufficient documentation

## 2021-03-13 DIAGNOSIS — N138 Other obstructive and reflux uropathy: Secondary | ICD-10-CM | POA: Diagnosis not present

## 2021-03-13 DIAGNOSIS — M1990 Unspecified osteoarthritis, unspecified site: Secondary | ICD-10-CM | POA: Insufficient documentation

## 2021-03-13 DIAGNOSIS — N411 Chronic prostatitis: Secondary | ICD-10-CM

## 2021-03-13 DIAGNOSIS — Z79899 Other long term (current) drug therapy: Secondary | ICD-10-CM | POA: Insufficient documentation

## 2021-03-13 DIAGNOSIS — I714 Abdominal aortic aneurysm, without rupture: Secondary | ICD-10-CM | POA: Diagnosis not present

## 2021-03-13 DIAGNOSIS — E785 Hyperlipidemia, unspecified: Secondary | ICD-10-CM | POA: Diagnosis not present

## 2021-03-13 DIAGNOSIS — Z7984 Long term (current) use of oral hypoglycemic drugs: Secondary | ICD-10-CM | POA: Insufficient documentation

## 2021-03-13 DIAGNOSIS — G4733 Obstructive sleep apnea (adult) (pediatric): Secondary | ICD-10-CM | POA: Diagnosis not present

## 2021-03-13 DIAGNOSIS — K219 Gastro-esophageal reflux disease without esophagitis: Secondary | ICD-10-CM | POA: Insufficient documentation

## 2021-03-13 HISTORY — DX: Hyperlipidemia, unspecified: E78.5

## 2021-03-13 HISTORY — DX: Obstructive sleep apnea (adult) (pediatric): G47.33

## 2021-03-13 HISTORY — DX: Obstructive sleep apnea (adult) (pediatric): Z99.89

## 2021-03-13 HISTORY — DX: Anxiety disorder, unspecified: F41.9

## 2021-03-13 HISTORY — DX: Paroxysmal atrial fibrillation: I48.0

## 2021-03-13 HISTORY — PX: CYSTOSCOPY WITH INSERTION OF UROLIFT: SHX6678

## 2021-03-13 LAB — GLUCOSE, CAPILLARY
Glucose-Capillary: 124 mg/dL — ABNORMAL HIGH (ref 70–99)
Glucose-Capillary: 123 mg/dL — ABNORMAL HIGH (ref 70–99)

## 2021-03-13 SURGERY — CYSTOSCOPY WITH INSERTION OF UROLIFT
Anesthesia: General

## 2021-03-13 MED ORDER — ONDANSETRON HCL 4 MG/2ML IJ SOLN
INTRAMUSCULAR | Status: AC
  Filled 2021-03-13: qty 2

## 2021-03-13 MED ORDER — HYDROCODONE-ACETAMINOPHEN 5-325 MG PO TABS: 1.0000 | ORAL_TABLET | Freq: Four times a day (QID) | ORAL | 0 refills | Status: DC | PRN

## 2021-03-13 MED ORDER — SODIUM CHLORIDE 0.9 % IV SOLN: INTRAVENOUS | Status: DC

## 2021-03-13 MED ORDER — CHLORHEXIDINE GLUCONATE 0.12 % MT SOLN: 15.0000 mL | Freq: Once | OROMUCOSAL | Status: DC

## 2021-03-13 MED ORDER — ONDANSETRON HCL 4 MG/2ML IJ SOLN
INTRAMUSCULAR | Status: DC | PRN
  Administered 2021-03-13: 4 mg via INTRAVENOUS

## 2021-03-13 MED ORDER — OXYCODONE HCL 5 MG PO TABS: 5.0000 mg | ORAL_TABLET | ORAL | Status: DC | PRN

## 2021-03-13 MED ORDER — MIDAZOLAM HCL 2 MG/2ML IJ SOLN
INTRAMUSCULAR | Status: DC | PRN
  Administered 2021-03-13 (×2): 1 mg via INTRAVENOUS

## 2021-03-13 MED ORDER — ORAL CARE MOUTH RINSE: 15.0000 mL | Freq: Once | OROMUCOSAL | Status: DC

## 2021-03-13 MED ORDER — FENTANYL CITRATE (PF) 100 MCG/2ML IJ SOLN: 25.0000 ug | INTRAMUSCULAR | Status: DC | PRN

## 2021-03-13 MED ORDER — CEFAZOLIN SODIUM-DEXTROSE 2-4 GM/100ML-% IV SOLN
2.0000 g | INTRAVENOUS | Status: AC
  Administered 2021-03-13: 2 g via INTRAVENOUS

## 2021-03-13 MED ORDER — MIDAZOLAM HCL 2 MG/2ML IJ SOLN
INTRAMUSCULAR | Status: AC
  Filled 2021-03-13: qty 2

## 2021-03-13 MED ORDER — ONDANSETRON HCL 4 MG/2ML IJ SOLN: 4.0000 mg | Freq: Once | INTRAMUSCULAR | Status: DC | PRN

## 2021-03-13 MED ORDER — FENTANYL CITRATE (PF) 100 MCG/2ML IJ SOLN
INTRAMUSCULAR | Status: DC | PRN
  Administered 2021-03-13 (×2): 25 ug via INTRAVENOUS

## 2021-03-13 MED ORDER — PROPOFOL 500 MG/50ML IV EMUL
INTRAVENOUS | Status: DC | PRN
  Administered 2021-03-13: 150 ug/kg/min via INTRAVENOUS
  Administered 2021-03-13: 30 mg via INTRAVENOUS

## 2021-03-13 MED ORDER — CHLORHEXIDINE GLUCONATE 0.12 % MT SOLN
OROMUCOSAL | Status: AC
  Filled 2021-03-13: qty 15

## 2021-03-13 MED ORDER — PROPOFOL 10 MG/ML IV BOLUS
INTRAVENOUS | Status: AC
  Filled 2021-03-13: qty 20

## 2021-03-13 MED ORDER — FENTANYL CITRATE (PF) 100 MCG/2ML IJ SOLN
INTRAMUSCULAR | Status: AC
  Filled 2021-03-13: qty 2

## 2021-03-13 MED ORDER — CEFAZOLIN SODIUM-DEXTROSE 2-4 GM/100ML-% IV SOLN
INTRAVENOUS | Status: AC
  Filled 2021-03-13: qty 100

## 2021-03-13 SURGICAL SUPPLY — 12 items
BAG DRAIN CYSTO-URO LG1000N (MISCELLANEOUS) ×2 IMPLANT
GLOVE SURG ENC MOIS LTX SZ6.5 (GLOVE) ×4 IMPLANT
GOWN STRL REUS W/ TWL LRG LVL3 (GOWN DISPOSABLE) ×2 IMPLANT
GOWN STRL REUS W/TWL LRG LVL3 (GOWN DISPOSABLE) ×4
KIT TURNOVER CYSTO (KITS) ×2 IMPLANT
MANIFOLD NEPTUNE II (INSTRUMENTS) ×2 IMPLANT
PACK CYSTO AR (MISCELLANEOUS) ×2 IMPLANT
SET CYSTO W/LG BORE CLAMP LF (SET/KITS/TRAYS/PACK) ×2 IMPLANT
SURGILUBE 2OZ TUBE FLIPTOP (MISCELLANEOUS) IMPLANT
SYSTEM UROLIFT (Male Continence) ×10 IMPLANT
WATER STERILE IRR 1000ML POUR (IV SOLUTION) ×2 IMPLANT
WATER STERILE IRR 3000ML UROMA (IV SOLUTION) ×2 IMPLANT

## 2021-03-13 NOTE — Anesthesia Preprocedure Evaluation (Signed)
Anesthesia Evaluation  Patient identified by MRN, date of birth, ID band Patient awake    Reviewed: Allergy & Precautions, H&P , NPO status , Patient's Chart, lab work & pertinent test results, reviewed documented beta blocker date and time   Airway Mallampati: III   Neck ROM: full    Dental  (+) Teeth Intact   Pulmonary shortness of breath, sleep apnea and Continuous Positive Airway Pressure Ventilation ,    Pulmonary exam normal        Cardiovascular Exercise Tolerance: Good hypertension, On Medications Normal cardiovascular exam+ Valvular Problems/Murmurs  Rhythm:regular Rate:Normal     Neuro/Psych  Headaches, Anxiety negative psych ROS   GI/Hepatic Neg liver ROS, GERD  Medicated,  Endo/Other  negative endocrine ROSdiabetes  Renal/GU negative Renal ROS  negative genitourinary   Musculoskeletal   Abdominal   Peds  Hematology negative hematology ROS (+)   Anesthesia Other Findings Past Medical History: No date: Allergic rhinitis No date: Anxiety 07/2020: Aortic aneurysm and dissection ([facility]) No date: Arthritis No date: Diabetes mellitus without complication ([facility]CC) No date: GERD (gastroesophageal reflux disease) No date: Headache No date: Heart murmur No date: History of kidney stones No date: HLD (hyperlipidemia) No date: Hypertension No date: Macular degeneration No date: OSA on CPAP No date: Paroxysmal atrial fibrillation ([place]) No date: Psoriasis Past Surgical History: 08/11/2020: ASCENDING AORTA GRAFT WITH CARDIOPULMONARY BYPASS; VALVE  SUSPENSION WITH CORONARY RECONSTRUCTION AND VALVE-SPARING AORTIC ROOT  REMODELING (DAVID PROCEDURE, YACOUB PROCEDURE)     Comment:  performed at [facility] No date: CATARACT EXTRACTION 11/14/2017: [place] LITHOTRIPSY; Left     Comment:  Procedure: EXTRACORPOREAL SHOCK WAVE LITHOTRIPSY (ESWL);              Surgeon: Hollice Espy, MD;  Location:  [facility] ORS;                Service: Urology;  Laterality: Left; No date: EYE SURGERY 1991: NASAL SINUS SURGERY 02/2020: PENILE PROSTHESIS IMPLANT No date: TONSILLECTOMY No date: VASECTOMY BMI    Body Mass Index: 33.91 kg/m     Reproductive/Obstetrics negative OB ROS                             Anesthesia Physical Anesthesia Plan  ASA: III  Anesthesia Plan: General   Post-op Pain Management:    Induction:   PONV Risk Score and Plan: 3  Airway Management Planned:   Additional Equipment:   Intra-op Plan:   Post-operative Plan:   Informed Consent: I have reviewed the patients History and Physical, chart, labs and discussed the procedure including the risks, benefits and alternatives for the proposed anesthesia with the patient or authorized representative who has indicated his/her understanding and acceptance.     Dental Advisory Given  Plan Discussed with: CRNA  Anesthesia Plan Comments:         Anesthesia Quick Evaluation

## 2021-03-13 NOTE — Discharge Instructions (Signed)
Urolift Post-Operative Instructions     Patient Expectations   1. Mild blood in your urine for about 1 week.  2. Urinary buring, frequency, and urgency for 10 days.  3. Mild pelvic pain 1-2 weeks.     Return to Activity     1. Drink water post procedure.  2. Take meds as needed.  Tylenol and/or Motrin is most helpful.  You may also by Pyridium/Azo over-the-counter for urinary burning.  3. No lifting or straining 48hrs.  4. Other activity when they feel up to it.  5. You may wean off taking Uribel, ditropan and rapaflo in about 2 weeks.  Don't stop all medications at once, stop one for a few days, then stop the next and so on.  AMBULATORY SURGERY  DISCHARGE INSTRUCTIONS   1) The drugs that you were given will stay in your system until tomorrow so for the next 24 hours you should not:  A) Drive an automobile B) Make any legal decisions C) Drink any alcoholic beverage   2) You may resume regular meals tomorrow.  Today it is better to start with liquids and gradually work up to solid foods.  You may eat anything you prefer, but it is better to start with liquids, then soup and crackers, and gradually work up to solid foods.   3) Please notify your doctor immediately if you have any unusual bleeding, trouble breathing, redness and pain at the surgery site, drainage, fever, or pain not relieved by medication.    4) Additional Instructions:        Please contact your physician with any problems or Same Day Surgery at [phone], Monday through Friday 6 am to 4 pm, or [place] at [facility] number at [phone]

## 2021-03-13 NOTE — Transfer of Care (Signed)
Immediate Anesthesia Transfer of Care Note  Patient: Sandy Salaam Lipsky  Procedure(s) Performed: CYSTOSCOPY WITH INSERTION OF UROLIFT (N/A )  Patient Location: PACU  Anesthesia Type:MAC  Level of Consciousness: drowsy and patient cooperative  Airway & Oxygen Therapy: Patient Spontanous Breathing and Patient connected to face mask oxygen  Post-op Assessment: Report given to RN and Post -op Vital signs reviewed and stable  Post vital signs: Reviewed and stable  Last Vitals:  Vitals Value Taken Time  BP 104/66 03/13/21 0934  Temp    Pulse 61 03/13/21 0937  Resp 11 03/13/21 0937  SpO2 97 % 03/13/21 0937  Vitals shown include unvalidated device data.  Last Pain:  Vitals:   03/13/21 0800  TempSrc: Oral  PainSc: 2          Complications: No complications documented.

## 2021-03-13 NOTE — Interval H&P Note (Signed)
History and Physical Interval Note:  03/13/2021 8:33 AM  Wayne Scott  has presented today for surgery, with the diagnosis of BPH with urinary hesitancy.  The various methods of treatment have been discussed with the patient and family. After consideration of risks, benefits and other options for treatment, the patient has consented to  Procedure(s): CYSTOSCOPY WITH INSERTION OF UROLIFT (N/A) as a surgical intervention.  The patient's history has been reviewed, patient examined, no change in status, stable for surgery.  I have reviewed the patient's chart and labs.  Questions were answered to the patient's satisfaction.    RRR CTAB   Hollice Espy

## 2021-03-13 NOTE — Op Note (Signed)
  Preoperative diagnosis: BPH with obstructive symptomatology  Postoperative diagnosis: BPH with obstructive symptomatology  Principal procedure: Urolift procedure, with the placement of5implants.  Surgeon:Keyonna Comunale  Anesthesia:MAC  Complications: None  Drains:None  Estimated blood loss: < 5 mL  Indications: 70 year-old male with obstructive/ irritative symptomatology secondary to BPH. The patient's symptoms have progressed, and he has requested further management. Management options including TURP with resection/ablation of the prostate as well as Urolift were discussed. The patient has chosen to have a Urolift procedure. He has been instructed to the procedure as well as risks and complications which include but are not limited to infection, bleeding, and inadequate treatment with the Urolift procedure alone, anesthetic complications, among others. He understands these and desires to proceed.  Findings: Using the 17 French cystoscope, urethra and bladder were inspected. There were no urethral lesions. Prostatic urethra was obstructed secondary to bilobar hypertrophy. The bladder was inspected circumferentially. This revealed trabeculation.  Description of procedure: The patient was properly identified in the holding area. He received preoperative IV antibiotics. He was taken to the operating room where MAC was administed. He is placed in the dorsolithotomy position. Genitalia and perineum were prepped and draped. Proper timeout was performed.  Male sounds were used to dilate the urethral meatus.  A 65F cystoscope was inserted into the bladderwith findings as described above. The 1st pair of implants were placed at the bladder neck~1.5 cmfrom the bladder Neck. The 2nd pair of implants were placed at the level of the verumontanum.  A repeat cysto was performed and another single implant was placed on the left in the middle of the first two implants on  that side.  A final cystoscopy was conducted first to inspect the location and state of each implant and second, to confirm the presence of a continuous anterior channel was present through the prostatic urethra with irrigation flow turned off.  Fiveimplants were delivered in total.  Following this, the scope was removed.After anesthetic reversal he was transportedto the PACU in stable condition. He tolerated the procedure well.  Plan:He will follow-up with me in 4 to 6 weeks for IPSS/PVR.

## 2021-03-14 ENCOUNTER — Other Ambulatory Visit
Admission: RE | Admit: 2021-03-14 | Discharge: 2021-03-14 | Disposition: A | Discharge status: Home / Self Care | Payer: Medicare Other | Attending: Ophthalmology | Admitting: Ophthalmology

## 2021-03-14 DIAGNOSIS — H353211 Exudative age-related macular degeneration, right eye, with active choroidal neovascularization: Secondary | ICD-10-CM | POA: Diagnosis not present

## 2021-03-14 DIAGNOSIS — H53121 Transient visual loss, right eye: Secondary | ICD-10-CM | POA: Insufficient documentation

## 2021-03-14 LAB — CBC WITH DIFFERENTIAL/PLATELET
Abs Immature Granulocytes: 0.04 10*3/uL (ref 0.00–0.07)
Basophils Absolute: 0.1 10*3/uL (ref 0.0–0.1)
Basophils Relative: 1 %
Eosinophils Absolute: 0.1 10*3/uL (ref 0.0–0.5)
Eosinophils Relative: 1 %
HCT: 45.1 % (ref 39.0–52.0)
Hemoglobin: 15.9 g/dL (ref 13.0–17.0)
Immature Granulocytes: 0 %
Lymphocytes Relative: 22 %
Lymphs Abs: 2 10*3/uL (ref 0.7–4.0)
MCH: 31.7 pg (ref 26.0–34.0)
MCHC: 35.3 g/dL (ref 30.0–36.0)
MCV: 89.8 fL (ref 80.0–100.0)
Monocytes Absolute: 0.7 10*3/uL (ref 0.1–1.0)
Monocytes Relative: 8 %
Neutro Abs: 6.3 10*3/uL (ref 1.7–7.7)
Neutrophils Relative %: 68 %
Platelets: 150 10*3/uL (ref 150–400)
RBC: 5.02 MIL/uL (ref 4.22–5.81)
RDW: 13.5 % (ref 11.5–15.5)
WBC: 9.3 10*3/uL (ref 4.0–10.5)
nRBC: 0 % (ref 0.0–0.2)

## 2021-03-14 LAB — SEDIMENTATION RATE: Sed Rate: 3 mm/hr (ref 0–20)

## 2021-03-14 LAB — C-REACTIVE PROTEIN: CRP: 0.5 mg/dL (ref <1.0)

## 2021-03-15 ENCOUNTER — Other Ambulatory Visit (HOSPITAL_COMMUNITY): Payer: Self-pay | Admitting: Ophthalmology

## 2021-03-15 ENCOUNTER — Other Ambulatory Visit: Payer: Self-pay | Admitting: Ophthalmology

## 2021-03-15 DIAGNOSIS — H53129 Transient visual loss, unspecified eye: Secondary | ICD-10-CM

## 2021-03-16 NOTE — Anesthesia Postprocedure Evaluation (Signed)
Anesthesia Post Note  Patient: Wayne Scott  Procedure(s) Performed: CYSTOSCOPY WITH INSERTION OF UROLIFT (N/A )  Patient location during evaluation: PACU Anesthesia Type: General Level of consciousness: awake and alert Pain management: pain level controlled Vital Signs Assessment: post-procedure vital signs reviewed and stable Respiratory status: spontaneous breathing, nonlabored ventilation, respiratory function stable and patient connected to nasal cannula oxygen Cardiovascular status: blood pressure returned to baseline and stable Postop Assessment: no apparent nausea or vomiting Anesthetic complications: no   No complications documented.   Last Vitals:  Vitals:   03/13/21 1030 03/13/21 1140  BP: 122/68 115/79  Pulse: 60 62  Resp: 18 17  Temp:    SpO2: 100% 99%    Last Pain:  Vitals:   03/14/21 0820  TempSrc:   PainSc: 3                  Molli Barrows

## 2021-03-24 ENCOUNTER — Other Ambulatory Visit: Payer: Self-pay

## 2021-03-24 ENCOUNTER — Ambulatory Visit
Admission: RE | Admit: 2021-03-24 | Discharge: 2021-03-24 | Disposition: A | Discharge status: Home / Self Care | Payer: Medicare Other | Source: Ambulatory Visit | Attending: Ophthalmology | Admitting: Ophthalmology

## 2021-03-24 DIAGNOSIS — H53129 Transient visual loss, unspecified eye: Secondary | ICD-10-CM | POA: Insufficient documentation

## 2021-03-24 DIAGNOSIS — H532 Diplopia: Secondary | ICD-10-CM | POA: Diagnosis not present

## 2021-03-24 IMAGING — MR MR HEAD WO/W CM
13 series · 48 of 48 positions shown · IV contrast (10ml Gadavist)
Comparison: None.

CLINICAL DATA: Loss of vision 10 days ago with some persistent
double vision.

EXAM:
MRI HEAD WITHOUT AND WITH CONTRAST
TECHNIQUE: Multiplanar, multiecho pulse sequences of the brain and surrounding
structures were obtained without and with intravenous contrast.
CONTRAST:  10mL GADAVIST GADOBUTROL 1 MMOL/ML IV SOLN

[Series 5: ax dwi_tracew · axial · 3.0mm · 0.65mm/px · z∈[-60,+89]mm · 3 of 48 slices shown]
[im 1/48]
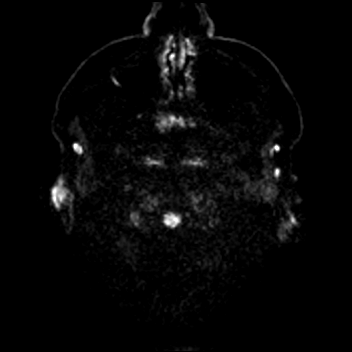
[im 24/48]
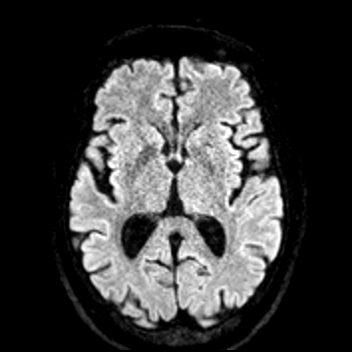
[im 48/48]
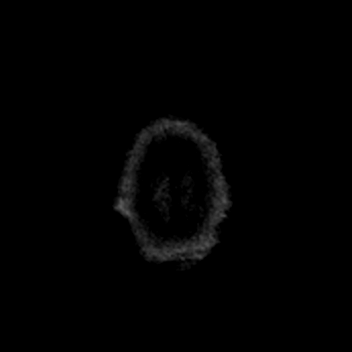

[Series 6: ax dwi_adc · axial · 3.0mm · 0.65mm/px · z∈[-60,+89]mm · 3 of 48 slices shown]
[im 1/48]
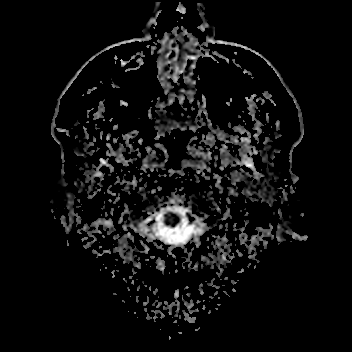
[im 24/48]
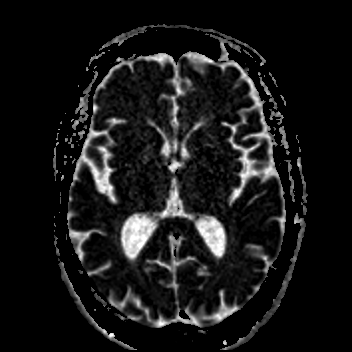
[im 48/48]
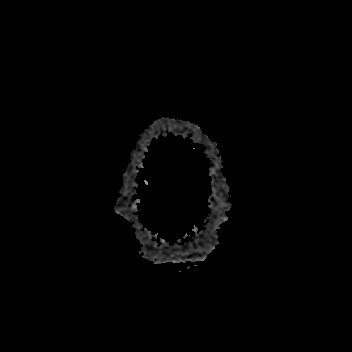

[Series 7: cor dwi_tracew · coronal · 5.0mm · 0.60mm/px · 2 of 38 slices shown]
[im 1/38]
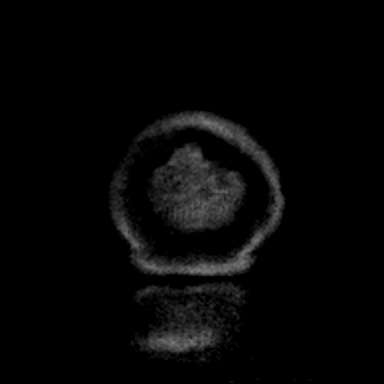
[im 38/38]
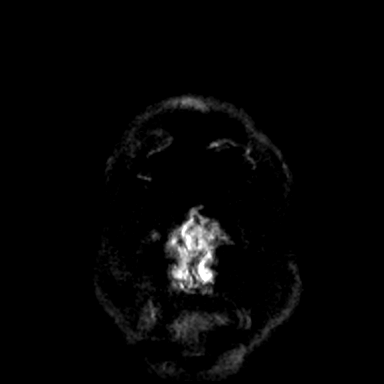

[Series 8: cor dwi_adc · coronal · 5.0mm · 0.60mm/px · 2 of 38 slices shown]
[im 1/38]
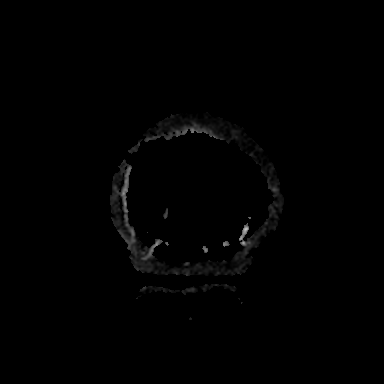
[im 38/38]
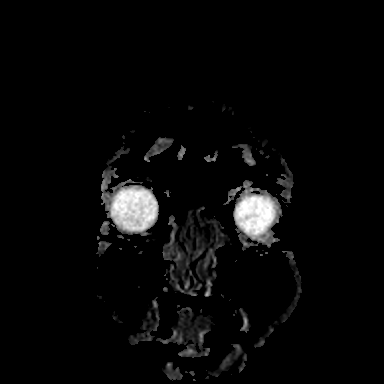

[Series 9: T1 · sagittal · 5.0mm · 0.62mm/px · 1 of 25 slices shown (1 of 2)]
[im 1/25]
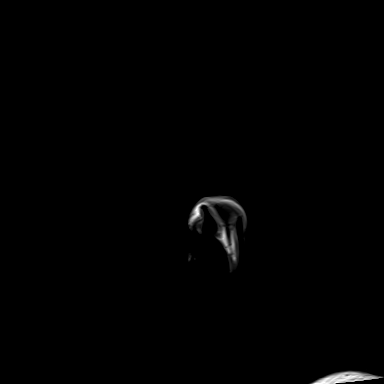

[Series 10: T2 · axial · 5.0mm · 0.53mm/px · 1 of 25 slices shown]
[im 1/25]
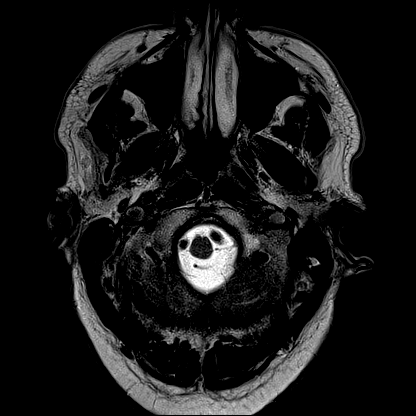

[Series 12: pha_images · axial · 3.0mm · 0.90mm/px · z∈[-69,+92]mm · 3 of 56 slices shown]
[im 1/56]
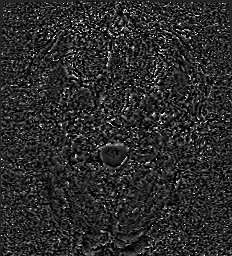
[im 28/56]
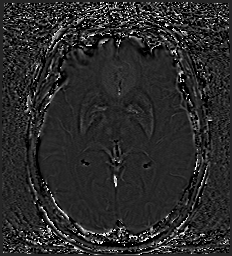
[im 56/56]
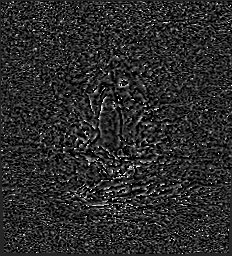

[Series 13: swi_images · axial · 3.0mm · 0.90mm/px · z∈[-69,+101]mm · 4 of 60 slices shown]
[im 1/60]
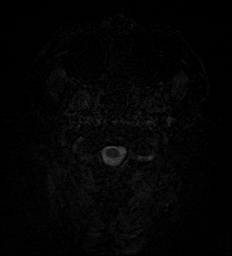
[im 20/60]
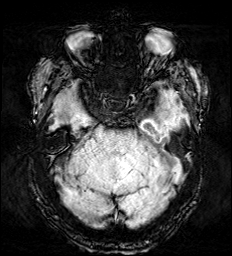
[im 40/60]
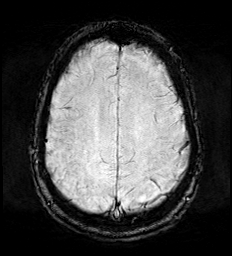
[im 60/60]
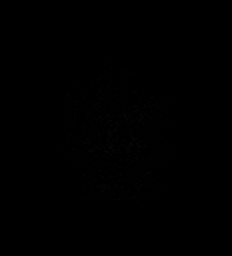

[Series 15: FLAIR · axial · 3.0mm · 0.53mm/px · z∈[-64,+92]mm · 3 of 55 slices shown]
[im 1/55]
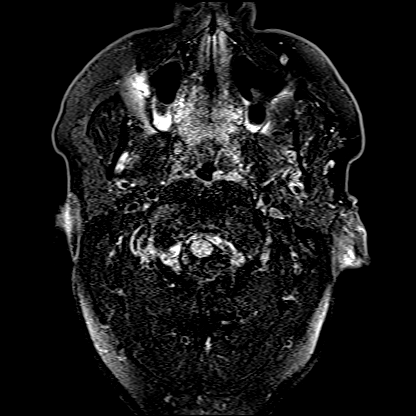
[im 28/55]
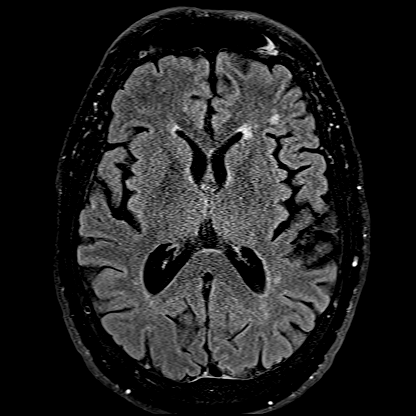
[im 55/55]
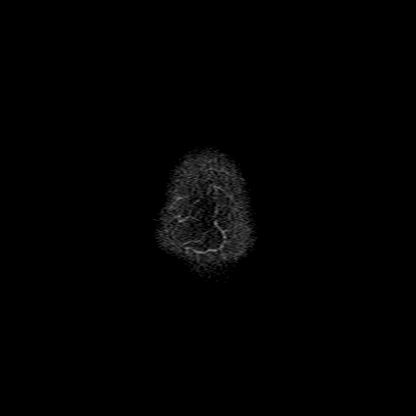

[Series 16: T1 · axial · 1.0mm · 0.98mm/px · z∈[-67,+102]mm · 11 of 176 slices shown (2 of 2)]
[im 1/176]
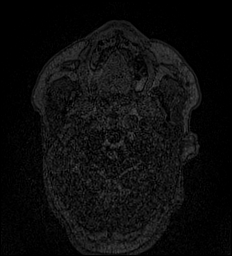
[im 18/176]
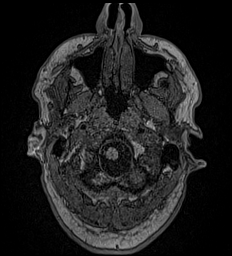
[im 36/176]
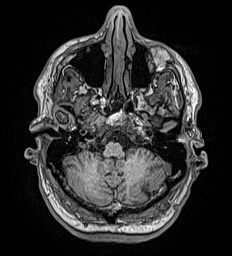
[im 53/176]
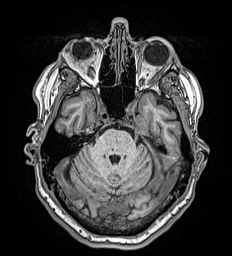
[im 71/176]
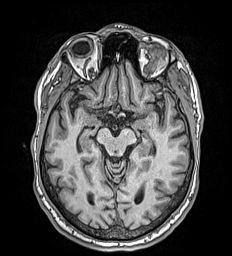
[im 88/176]
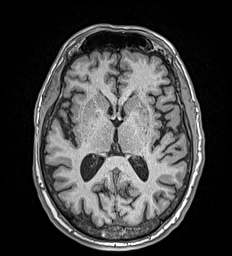
[im 106/176]
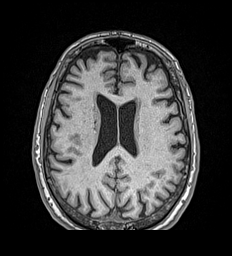
[im 123/176]
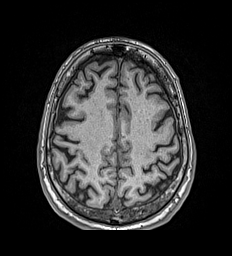
[im 141/176]
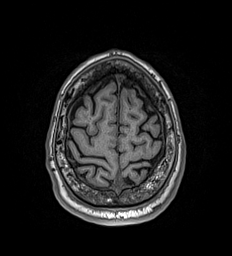
[im 158/176]
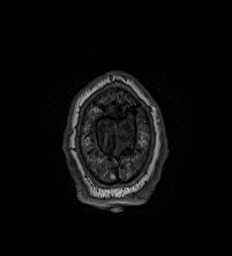
[im 176/176]
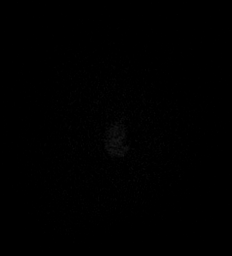

[Series 17: T2 post-contrast · coronal · 5.0mm · 0.57mm/px · 2 of 29 slices shown]
[im 1/29]
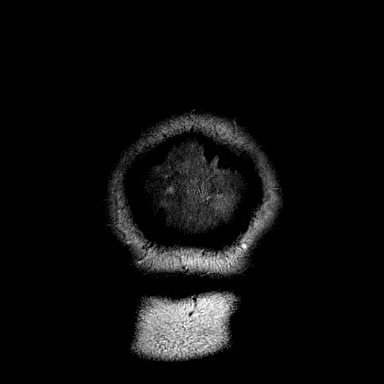
[im 29/29]
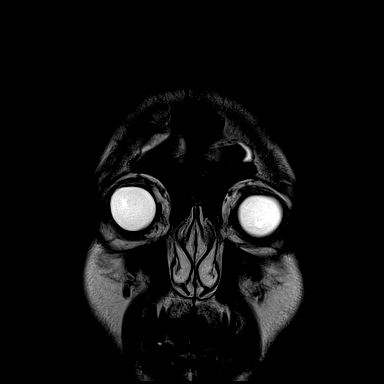

[Series 18: T1 post-contrast · axial · 1.0mm · 0.98mm/px · z∈[-67,+102]mm · 11 of 176 slices shown (1 of 2)]
[im 1/176]
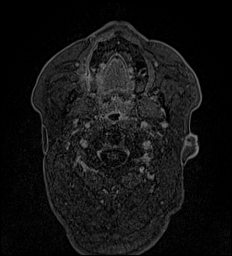
[im 18/176]
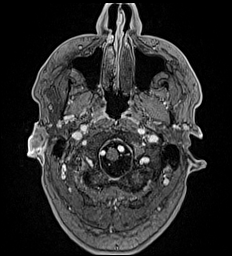
[im 36/176]
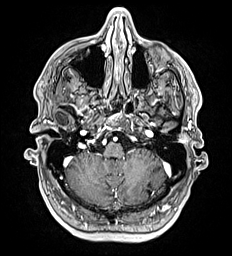
[im 53/176]
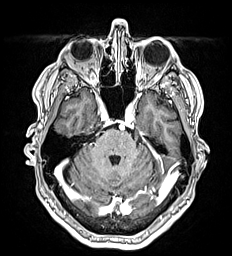
[im 71/176]
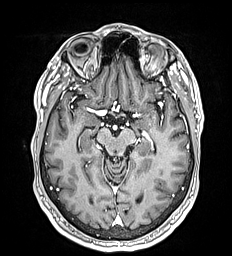
[im 88/176]
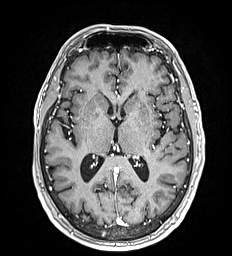
[im 106/176]
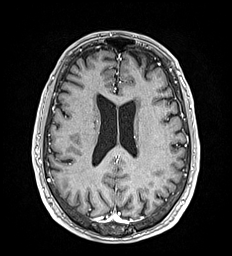
[im 123/176]
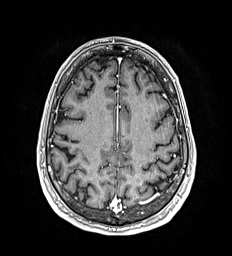
[im 141/176]
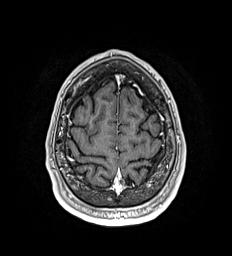
[im 158/176]
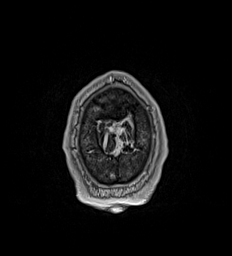
[im 176/176]
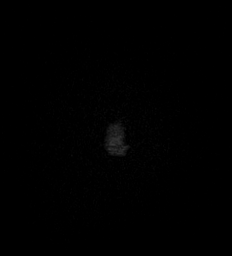

[Series 19: T1 post-contrast · coronal · 5.0mm · 0.57mm/px · 2 of 29 slices shown (2 of 2)]
[im 1/29]
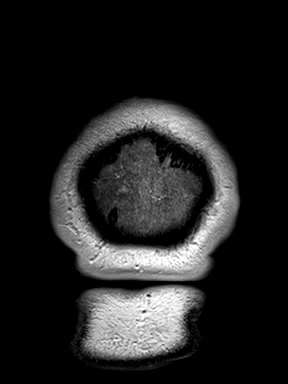
[im 29/29]
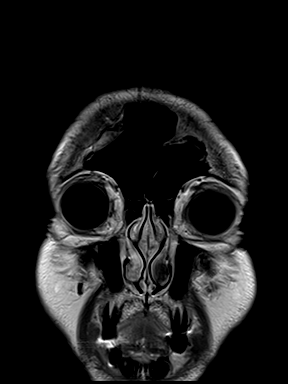

[48 of 48 positions shown; findings below may reference images not displayed]

FINDINGS: Brain: Diffusion imaging does not show any acute or subacute
infarction. Brainstem and cerebellum are normal. Cerebral
hemispheres show mild small vessel change of the white matter, less
than often seen at this age. No cortical or large vessel territory
infarction. No mass lesion, hemorrhage, hydrocephalus or extra-axial
collection.

Vascular: Major vessels at the base of the brain show flow.

Skull and upper cervical spine: Negative

Sinuses/Orbits: Clear/normal

Other: None
IMPRESSION: No abnormality seen to explain the clinical presentation. Minimal
small vessel change of the cerebral hemispheric white matter, less
than often seen at this age. No cortical or large vessel territory
stroke. No recent insult.

## 2021-03-24 MED ORDER — GADOBUTROL 1 MMOL/ML IV SOLN
10.0000 mL | Freq: Once | INTRAVENOUS | Status: AC | PRN
  Administered 2021-03-24: 10 mL via INTRAVENOUS

## 2021-04-03 ENCOUNTER — Encounter: Payer: Self-pay | Admitting: Urology

## 2021-04-03 DIAGNOSIS — E119 Type 2 diabetes mellitus without complications: Secondary | ICD-10-CM | POA: Diagnosis not present

## 2021-04-03 DIAGNOSIS — I4891 Unspecified atrial fibrillation: Secondary | ICD-10-CM | POA: Diagnosis not present

## 2021-04-03 DIAGNOSIS — I10 Essential (primary) hypertension: Secondary | ICD-10-CM | POA: Diagnosis not present

## 2021-04-03 DIAGNOSIS — G4733 Obstructive sleep apnea (adult) (pediatric): Secondary | ICD-10-CM | POA: Diagnosis not present

## 2021-04-03 NOTE — Telephone Encounter (Signed)
Spoke with patient denies any other ongoing symptoms-scheduled appointment for evaluation in office tomorrow.

## 2021-04-03 NOTE — Progress Notes (Signed)
04/04/2021 8:47 AM   Wayne Scott [birth year] [id number]  Referring provider: Mick Sell, MD [address] [place],  [place] [address]  Chief Complaint  Patient presents with  . Other   Urological history: 1. ED -contributing factors of age, anxiety, DM, HLD, HTN and sleep apnea -SHIM 12 -managed with ICI  2. BPH with LU TS -s/p UroLift 03/13/2021  -PVR 106 mL  3. Nodular prostate -PSA 0.2 in 01/2021 -Nodule at the apex today, rubbery, likely BPH nodule  4. Nephrolithiasis -ESWL 2018  HPI: Wayne Scott is a 70 y.o. male who is s/p UroLift experiencing a weak urinary stream, frequency, increased urgency, nocturia and dysuria for the past 4-5 days.  He finished up the uribel about a week ago.   He states after the UroLift procedure his postoperative course was as expected and uneventful.  He stated he had 2 weeks left of the Rapaflo and the Uribel and he completed those medications and then 3 days later he started to experience frequency urgency pain at the base of his penis and then he had intercourse which made the symptoms worse.  Patient denies any modifying or aggravating factors.  Patient denies any gross hematuria, dysuria or suprapubic/flank pain.  Patient denies any fevers, chills, nausea or vomiting.   His UA is bland.  His PVR is 106 mL.    PMH: Past Medical History:  Diagnosis Date  . Allergic rhinitis   . Anxiety   . 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 Arthritis   . Diabetes mellitus without complication (HCC)   . GERD (gastroesophageal reflux disease)   . Headache   . Heart murmur   . History of kidney stones   . HLD (hyperlipidemia)   . Hypertension   . Macular degeneration   . OSA on CPAP   . Paroxysmal atrial fibrillation (HCC)   . Psoriasis     Surgical History: Past Surgical History:  Procedure Laterality Date  . ASCENDING AORTA GRAFT WITH CARDIOPULMONARY BYPASS; VALVE SUSPENSION WITH CORONARY  RECONSTRUCTION AND VALVE-SPARING AORTIC ROOT REMODELING (DAVID PROCEDURE, YACOUB PROCEDURE)  08/11/2020   performed at [facility]  . CATARACT EXTRACTION    . CYSTOSCOPY WITH INSERTION OF UROLIFT N/A 03/13/2021   Procedure: CYSTOSCOPY WITH INSERTION OF UROLIFT;  Surgeon: Vanna Scotland, MD;  Location: [facility] ORS;  Service: Urology;  Laterality: N/A;  . EXTRACORPOREAL SHOCK WAVE LITHOTRIPSY Left 11/14/2017   Procedure: EXTRACORPOREAL SHOCK WAVE LITHOTRIPSY (ESWL);  Surgeon: Vanna Scotland, MD;  Location: [facility] ORS;  Service: Urology;  Laterality: Left;  . EYE SURGERY    . NASAL SINUS SURGERY  1991  . PENILE PROSTHESIS IMPLANT  02/2020  . TONSILLECTOMY    . VASECTOMY      Home Medications:  Allergies as of 04/04/2021      Reactions   Atorvastatin Other (See Comments)   Leg cramps    Pravastatin Other (See Comments)   Leg cramps    Rosuvastatin Other (See Comments)      Medication List       Accurate as of Apr 04, 2021 11:59 PM. If you have any questions, ask your nurse or doctor.        STOP taking these medications   Uribel 118 MG Caps Replaced by: phenazopyridine 100 MG tablet Stopped by: Michiel Cowboy, PA-C     TAKE these medications   acetaminophen 500 MG tablet Commonly known as: TYLENOL Take 1,000 mg by mouth every 6 (six) hours  as needed for moderate pain.   acetic acid-hydrocortisone OTIC solution Commonly known as: VOSOL-HC Place 1 drop into both ears daily as needed (irritation).   amLODipine 10 MG tablet Commonly known as: NORVASC Take 10 mg by mouth daily.   blood glucose meter kit and supplies Kit Dispense based on patient and insurance preference. Use once daily as directed. (FOR ICD-10 E11.9).   cholecalciferol 1000 units tablet Commonly known as: VITAMIN D Take 1,000 Units by mouth daily.   COSENTYX [place] Inject 1 application into the skin every 30 (thirty) days.   Fish Oil 1000 MG Caps Take 4,000 mg by mouth daily.   fluticasone 50 MCG/ACT  nasal spray Commonly known as: FLONASE Place 2 sprays into both nostrils daily. What changed:   when to take this  reasons to take this   HYDROcodone-acetaminophen 5-325 MG tablet Commonly known as: NORCO/VICODIN Take 1-2 tablets by mouth every 6 (six) hours as needed for moderate pain.   latanoprost 0.005 % ophthalmic solution Commonly known as: XALATAN Place 1 drop into both eyes at bedtime.   metFORMIN 500 MG tablet Commonly known as: GLUCOPHAGE Take 500 mg by mouth daily.   methotrexate 2.5 MG tablet Commonly known as: RHEUMATREX Take 12.5 mg by mouth every Tuesday.   metoprolol tartrate 50 MG tablet Commonly known as: LOPRESSOR Take 50 mg by mouth in the morning and at bedtime.   multivitamin capsule Take 1 capsule by mouth daily.   naproxen sodium 220 MG tablet Commonly known as: ALEVE Take 220 mg by mouth 2 (two) times daily as needed (pain).   omeprazole 20 MG capsule Commonly known as: PRILOSEC TAKE 1 CAPSULE DAILY What changed:   how much to take  how to take this  when to take this  additional instructions   oxybutynin 10 MG 24 hr tablet Commonly known as: DITROPAN-XL TAKE 1 TABLET BY MOUTH EVERY DAY What changed: when to take this   phenazopyridine 100 MG tablet Commonly known as: PYRIDIUM Take 1 tablet (100 mg total) by mouth 3 (three) times daily as needed for pain. Replaces: Uribel 118 MG Caps Started by: Zara Council, PA-C   polyethylene glycol powder 17 GM/SCOOP powder Commonly known as: GLYCOLAX/MIRALAX TAKE 17 G BY MOUTH DAILY AS NEEDED FOR MILD CONSTIPATION.   PRESERVISION AREDS 2 PO Take 1 capsule by mouth in the morning and at bedtime.   QC TUMERIC COMPLEX PO Take 2 capsules by mouth daily.   silodosin 8 MG Caps capsule Commonly known as: RAPAFLO Take 1 capsule (8 mg total) by mouth daily with breakfast.   SUPER B COMPLEX PO Take 1 tablet by mouth daily.   zolpidem 5 MG tablet Commonly known as: AMBIEN Take 5 mg  by mouth at bedtime as needed (sleep).       Allergies:  Allergies  Allergen Reactions  . Atorvastatin Other (See Comments)    Leg cramps   . Pravastatin Other (See Comments)    Leg cramps   . Rosuvastatin Other (See Comments)    Family History: Family History  Problem Relation Age of Onset  . Neuropathy Mother   . Stroke Mother   . Macular degeneration Mother   . Diabetes Father   . Arthritis Other        Parent  . Hypertension Other        Parent  . Diabetes Other        Parent  . Heart attack Brother   . Kidney cancer Brother   .  Bladder Cancer Neg Hx   . Prostate cancer Neg Hx     Social History:  reports that he has never smoked. He has never used smokeless tobacco. He reports current alcohol use of about 2.0 standard drinks of alcohol per week. He reports that he does not use drugs.  ROS: Pertinent ROS in HPI  Physical Exam: BP (!) 142/76   Pulse 72   Ht 6' (1.829 m)   Wt 251 lb (113.9 kg)   BMI 34.04 kg/m   Constitutional:  Well nourished. Alert and oriented, No acute distress. HEENT: [place] AT, mask in place.  Trachea midline Cardiovascular: No clubbing, cyanosis, or edema. Respiratory: Normal respiratory effort, no increased work of breathing. Neurologic: Grossly intact, no focal deficits, moving all 4 extremities. Psychiatric: Normal mood and affect.  Laboratory Data: Lab Results  Component Value Date   WBC 9.3 03/14/2021   HGB 15.9 03/14/2021   HCT 45.1 03/14/2021   MCV 89.8 03/14/2021   PLT 150 03/14/2021    Lab Results  Component Value Date   CREATININE 0.79 08/11/2020    Lab Results  Component Value Date   HGBA1C 6.6 (H) 04/26/2020       Component Value Date/Time   CHOL 214 (H) 04/26/2020 1117   CHOL 201 (H) 09/21/2015 0800   HDL 68.30 04/26/2020 1117   HDL 78 09/21/2015 0800   CHOLHDL 3 04/26/2020 1117   VLDL 15.2 04/26/2020 1117   LDLCALC 130 (H) 04/26/2020 1117   LDLCALC 111 (H) 09/21/2015 0800    Lab Results   Component Value Date   AST 26 08/11/2020   Lab Results  Component Value Date   ALT 29 08/11/2020    Urinalysis Component     Latest Ref Rng & Units 04/04/2021  Specific Gravity, UA     1.005 - 1.030 1.015  pH, UA     5.0 - 7.5 7.0  Color, UA     Yellow Yellow  Appearance Ur     Clear Hazy (A)  Leukocytes,UA     Negative Negative  Protein,UA     Negative/Trace Negative  Glucose, UA     Negative Negative  Ketones, UA     Negative Negative  RBC, UA     Negative Negative  Bilirubin, UA     Negative Negative  Urobilinogen, Ur     0.2 - 1.0 mg/dL 1.0  Nitrite, UA     Negative Negative  Microscopic Examination      See below:   Component     Latest Ref Rng & Units 04/04/2021  WBC, UA     0 - 5 /hpf 0-5  RBC     0 - 2 /hpf 0-2  Epithelial Cells (non renal)     0 - 10 /hpf 0-10  Casts     None seen /lpf Present (A)  Cast Type     N/A Granular casts (A)  Crystals     N/A Present (A)  Crystal Type     N/A Amorphous Sediment  Bacteria, UA     None seen/Few None seen  I have reviewed the labs.   Pertinent Imaging: Results for HARVIN, KONICEK (MRN [id number]) as of 04/04/2021 12:40  Ref. Range 04/04/2021 08:52  Scan Result Unknown 106    Assessment & Plan:    1. Dysuria -UA bland, will send for culture to rule out indolent infection -His symptoms are likely due to to normal postoperative symptoms after undergoing a UroLift and patient  is reassured -I will restart his Rapaflo and Uribel at this time -He will follow-up with Dr. Erlene Quan as scheduled  2. BPH with LU TS -s/p UroLift   Return for keep follow up with Dr. Erlene Quan.  These notes generated with voice recognition software. I apologize for typographical errors.  Zara Council, PA-C  [facility] [address]  [place], [place] [address] [phone]

## 2021-04-04 ENCOUNTER — Other Ambulatory Visit: Payer: Self-pay | Admitting: Urology

## 2021-04-04 ENCOUNTER — Other Ambulatory Visit: Payer: Self-pay

## 2021-04-04 ENCOUNTER — Encounter: Payer: Self-pay | Admitting: Urology

## 2021-04-04 ENCOUNTER — Ambulatory Visit (INDEPENDENT_AMBULATORY_CARE_PROVIDER_SITE_OTHER): Payer: Medicare Other | Admitting: Urology

## 2021-04-04 VITALS — BP 142/76 | HR 72 | Ht 72.0 in | Wt 251.0 lb

## 2021-04-04 DIAGNOSIS — R300 Dysuria: Secondary | ICD-10-CM | POA: Diagnosis not present

## 2021-04-04 DIAGNOSIS — N138 Other obstructive and reflux uropathy: Secondary | ICD-10-CM

## 2021-04-04 DIAGNOSIS — N411 Chronic prostatitis: Secondary | ICD-10-CM | POA: Diagnosis not present

## 2021-04-04 DIAGNOSIS — N401 Enlarged prostate with lower urinary tract symptoms: Secondary | ICD-10-CM

## 2021-04-04 LAB — BLADDER SCAN AMB NON-IMAGING: Scan Result: 106

## 2021-04-04 MED ORDER — URIBEL 118 MG PO CAPS: 1.0000 | ORAL_CAPSULE | Freq: Four times a day (QID) | ORAL | 3 refills | Status: DC | PRN

## 2021-04-04 MED ORDER — SILODOSIN 8 MG PO CAPS: 8.0000 mg | ORAL_CAPSULE | Freq: Every day | ORAL | 0 refills | Status: DC

## 2021-04-05 ENCOUNTER — Other Ambulatory Visit: Payer: Self-pay | Admitting: Urology

## 2021-04-05 DIAGNOSIS — E119 Type 2 diabetes mellitus without complications: Secondary | ICD-10-CM | POA: Diagnosis not present

## 2021-04-05 DIAGNOSIS — I7100 Dissection of unspecified site of aorta: Secondary | ICD-10-CM | POA: Diagnosis not present

## 2021-04-05 DIAGNOSIS — I719 Aortic aneurysm of unspecified site, without rupture: Secondary | ICD-10-CM | POA: Diagnosis not present

## 2021-04-05 DIAGNOSIS — N411 Chronic prostatitis: Secondary | ICD-10-CM

## 2021-04-05 DIAGNOSIS — I10 Essential (primary) hypertension: Secondary | ICD-10-CM | POA: Diagnosis not present

## 2021-04-05 DIAGNOSIS — E782 Mixed hyperlipidemia: Secondary | ICD-10-CM | POA: Diagnosis not present

## 2021-04-05 LAB — URINALYSIS, COMPLETE
Bilirubin, UA: NEGATIVE
Glucose, UA: NEGATIVE
Ketones, UA: NEGATIVE
Leukocytes,UA: NEGATIVE
Nitrite, UA: NEGATIVE
Protein,UA: NEGATIVE
RBC, UA: NEGATIVE
Specific Gravity, UA: 1.015 (ref 1.005–1.030)
Urobilinogen, Ur: 1 mg/dL (ref 0.2–1.0)
pH, UA: 7 (ref 5.0–7.5)

## 2021-04-05 LAB — MICROSCOPIC EXAMINATION: Bacteria, UA: NONE SEEN

## 2021-04-05 MED ORDER — PHENAZOPYRIDINE HCL 100 MG PO TABS: 100.0000 mg | ORAL_TABLET | Freq: Three times a day (TID) | ORAL | 3 refills | Status: DC | PRN

## 2021-04-05 NOTE — Progress Notes (Signed)
Prescription sent for pyridium.

## 2021-04-10 DIAGNOSIS — Z1211 Encounter for screening for malignant neoplasm of colon: Secondary | ICD-10-CM | POA: Diagnosis not present

## 2021-04-10 DIAGNOSIS — I10 Essential (primary) hypertension: Secondary | ICD-10-CM | POA: Diagnosis not present

## 2021-04-10 DIAGNOSIS — R413 Other amnesia: Secondary | ICD-10-CM | POA: Diagnosis not present

## 2021-04-10 DIAGNOSIS — L4050 Arthropathic psoriasis, unspecified: Secondary | ICD-10-CM | POA: Diagnosis not present

## 2021-04-10 DIAGNOSIS — E119 Type 2 diabetes mellitus without complications: Secondary | ICD-10-CM | POA: Diagnosis not present

## 2021-04-10 DIAGNOSIS — G4733 Obstructive sleep apnea (adult) (pediatric): Secondary | ICD-10-CM | POA: Diagnosis not present

## 2021-04-10 DIAGNOSIS — Z0000 Encounter for general adult medical examination without abnormal findings: Secondary | ICD-10-CM | POA: Diagnosis not present

## 2021-04-10 DIAGNOSIS — I4891 Unspecified atrial fibrillation: Secondary | ICD-10-CM | POA: Diagnosis not present

## 2021-04-10 DIAGNOSIS — I719 Aortic aneurysm of unspecified site, without rupture: Secondary | ICD-10-CM | POA: Diagnosis not present

## 2021-04-10 DIAGNOSIS — Z1212 Encounter for screening for malignant neoplasm of rectum: Secondary | ICD-10-CM | POA: Diagnosis not present

## 2021-04-10 LAB — CULTURE, URINE COMPREHENSIVE

## 2021-04-11 DIAGNOSIS — H353222 Exudative age-related macular degeneration, left eye, with inactive choroidal neovascularization: Secondary | ICD-10-CM | POA: Diagnosis not present

## 2021-04-11 DIAGNOSIS — H353211 Exudative age-related macular degeneration, right eye, with active choroidal neovascularization: Secondary | ICD-10-CM | POA: Diagnosis not present

## 2021-04-13 DIAGNOSIS — R404 Transient alteration of awareness: Secondary | ICD-10-CM | POA: Diagnosis not present

## 2021-04-13 DIAGNOSIS — I7100 Dissection of unspecified site of aorta: Secondary | ICD-10-CM | POA: Diagnosis not present

## 2021-04-18 ENCOUNTER — Other Ambulatory Visit: Payer: Self-pay | Admitting: Neurology

## 2021-04-18 DIAGNOSIS — I7100 Dissection of unspecified site of aorta: Secondary | ICD-10-CM

## 2021-04-19 ENCOUNTER — Other Ambulatory Visit: Payer: Self-pay | Admitting: Neurology

## 2021-04-19 ENCOUNTER — Ambulatory Visit (INDEPENDENT_AMBULATORY_CARE_PROVIDER_SITE_OTHER): Payer: Medicare Other | Admitting: Urology

## 2021-04-19 ENCOUNTER — Other Ambulatory Visit: Payer: Self-pay

## 2021-04-19 VITALS — BP 149/79 | HR 71 | Ht 72.0 in | Wt 250.0 lb

## 2021-04-19 DIAGNOSIS — N401 Enlarged prostate with lower urinary tract symptoms: Secondary | ICD-10-CM | POA: Diagnosis not present

## 2021-04-19 DIAGNOSIS — Z1212 Encounter for screening for malignant neoplasm of rectum: Secondary | ICD-10-CM | POA: Diagnosis not present

## 2021-04-19 DIAGNOSIS — G459 Transient cerebral ischemic attack, unspecified: Secondary | ICD-10-CM

## 2021-04-19 DIAGNOSIS — Z1211 Encounter for screening for malignant neoplasm of colon: Secondary | ICD-10-CM | POA: Diagnosis not present

## 2021-04-19 DIAGNOSIS — R3911 Hesitancy of micturition: Secondary | ICD-10-CM | POA: Diagnosis not present

## 2021-04-19 LAB — BLADDER SCAN AMB NON-IMAGING: Scan Result: 62

## 2021-04-19 NOTE — Progress Notes (Signed)
04/19/2021 2:07 PM   Wayne Scott 07/11/1951 [id number]  Referring provider: Leonel Ramsay, MD [place],   [address]  Chief Complaint  Patient presents with  . Benign Prostatic Hypertrophy    HPI: 70 year old male with personal history of BPH with irritative voiding symptoms/chronic prostatitis he presents today for follow-up.  He underwent UroLift on 03/13/2021 in the setting of evidence of outlet obstruction.  Immediately postoperatively, he had no issues however developed discomfort/dysuria and was seen and evaluated by Natasha Mead, PA-C.  He was prescribed Pyridium and supportive care.  No evidence of UTI at the time.  Today, he reports that he did pretty well for the first 1 to 2 weeks and then had acute episode of dysuria, urgency frequency.  He was restarted on Rapaflo which he had stopped by Larene Beach and took that for about a week, improved dramatically and then stopped again.  His symptoms started to recur and he actually started taking the medicine again last night.  He reports that he is doing so-so.  He can definitely tell a difference in terms of urinary stream and inability to empty.  He has a lot of urgency and frequency still.  Overall, he thinks that this is improving slowly.  He was getting up 4 times at night to void, this had gone down to 2 after resuming Rapaflo last night.   IPSS    Row Name 04/19/21 1400         International Prostate Symptom Score   How often have you had to urinate less than every two hours? About half the time     How often have you found you stopped and started again several times when you urinated? Less than half the time     How often have you found it difficult to postpone urination? Less than half the time     How often have you had a weak urinary stream? Less than half the time     How often have you had to strain to start urination? Less than half the time     How many times did you typically  get up at night to urinate? 4 Times     Total IPSS Score 15           Quality of Life due to urinary symptoms   If you were to spend the rest of your life with your urinary condition just the way it is now how would you feel about that? Mostly Disatisfied            Score:  1-7 Mild 8-19 Moderate 20-35 Severe    PMH: Past Medical History:  Diagnosis Date  . Allergic rhinitis   . Anxiety   . Aortic aneurysm and dissection ([place]) 07/2020  . Arthritis   . Diabetes mellitus without complication ([place])   . GERD (gastroesophageal reflux disease)   . Headache   . Heart murmur   . History of kidney stones   . HLD (hyperlipidemia)   . Hypertension   . Macular degeneration   . OSA on CPAP   . Paroxysmal atrial fibrillation (HCC)   . Psoriasis     Surgical History: Past Surgical History:  Procedure Laterality Date  . ASCENDING AORTA GRAFT WITH CARDIOPULMONARY BYPASS; VALVE SUSPENSION WITH CORONARY RECONSTRUCTION AND VALVE-SPARING AORTIC ROOT REMODELING (DAVID PROCEDURE, YACOUB PROCEDURE)  08/11/2020   performed at [facility]    . CYSTOSCOPY WITH INSERTION OF UROLIFT  N/A 03/13/2021   Procedure: CYSTOSCOPY WITH INSERTION OF UROLIFT;  Surgeon: Hollice Espy, MD;  Location: [facility] ORS;  Service: Urology;  Laterality: N/A;  . EXTRACORPOREAL SHOCK WAVE LITHOTRIPSY Left 11/14/2017   Procedure: EXTRACORPOREAL SHOCK WAVE LITHOTRIPSY (ESWL);  Surgeon: Hollice Espy, MD;  Location: [facility] ORS;  Service: Urology;  Laterality: Left;  . EYE SURGERY    . NASAL SINUS SURGERY  1991  . PENILE PROSTHESIS IMPLANT  02/2020  . TONSILLECTOMY    . VASECTOMY      Home Medications:  Allergies as of 04/19/2021      Reactions   Atorvastatin Other (See Comments)   Leg cramps    Pravastatin Other (See Comments)   Leg cramps    Rosuvastatin Other (See Comments)      Medication List       Accurate as of Apr 19, 2021 11:59 PM. If you have any questions, ask your nurse or  doctor.        STOP taking these medications   oxybutynin 10 MG 24 hr tablet Commonly known as: DITROPAN-XL Stopped by: Hollice Espy, MD   phenazopyridine 100 MG tablet Commonly known as: PYRIDIUM Stopped by: Hollice Espy, MD     TAKE these medications   acetaminophen 500 MG tablet Commonly known as: TYLENOL Take 1,000 mg by mouth every 6 (six) hours as needed for moderate pain.   acetic acid-hydrocortisone OTIC solution Commonly known as: VOSOL-HC Place 1 drop into both ears daily as needed (irritation).   amLODipine 10 MG tablet Commonly known as: NORVASC Take 10 mg by mouth daily.   blood glucose meter kit and supplies Kit Dispense based on patient and insurance preference. Use once daily as directed. (FOR ICD-10 E11.9).   cholecalciferol 1000 units tablet Commonly known as: VITAMIN D Take 1,000 Units by mouth daily.   clopidogrel 75 MG tablet Commonly known as: PLAVIX Take 1 tablet by mouth daily.   COSENTYX  Inject 1 application into the skin every 30 (thirty) days.   Fish Oil 1000 MG Caps Take 4,000 mg by mouth daily.   fluticasone 50 MCG/ACT nasal spray Commonly known as: FLONASE Place 2 sprays into both nostrils daily. What changed:   when to take this  reasons to take this   folic acid 1 MG tablet Commonly known as: FOLVITE Take 1 mg by mouth daily.   HYDROcodone-acetaminophen 5-325 MG tablet Commonly known as: NORCO/VICODIN Take 1-2 tablets by mouth every 6 (six) hours as needed for moderate pain.   latanoprost 0.005 % ophthalmic solution Commonly known as: XALATAN Place 1 drop into both eyes at bedtime.   metFORMIN 500 MG tablet Commonly known as: GLUCOPHAGE Take 500 mg by mouth daily.   methotrexate 2.5 MG tablet Commonly known as: RHEUMATREX Take 12.5 mg by mouth every Tuesday.   metoprolol tartrate 50 MG tablet Commonly known as: LOPRESSOR Take 50 mg by mouth in the morning and at bedtime.   multivitamin capsule Take 1  capsule by mouth daily.   naproxen sodium 220 MG tablet Commonly known as: ALEVE Take 220 mg by mouth 2 (two) times daily as needed (pain).   omeprazole 20 MG capsule Commonly known as: PRILOSEC TAKE 1 CAPSULE DAILY What changed:   how much to take  how to take this  when to take this  additional instructions   polyethylene glycol powder 17 GM/SCOOP powder Commonly known as: GLYCOLAX/MIRALAX TAKE 17 G BY MOUTH DAILY AS NEEDED FOR MILD CONSTIPATION.   PRESERVISION AREDS 2 PO  Take 1 capsule by mouth in the morning and at bedtime.   QC TUMERIC COMPLEX PO Take 2 capsules by mouth daily.   silodosin 8 MG Caps capsule Commonly known as: RAPAFLO Take 1 capsule (8 mg total) by mouth daily with breakfast.   SUPER B COMPLEX PO Take 1 tablet by mouth daily.   zolpidem 5 MG tablet Commonly known as: AMBIEN Take 5 mg by mouth at bedtime as needed (sleep).       Allergies:  Allergies  Allergen Reactions  . Atorvastatin Other (See Comments)    Leg cramps   . Pravastatin Other (See Comments)    Leg cramps   . Rosuvastatin Other (See Comments)    Family History: Family History  Problem Relation Age of Onset  . Neuropathy Mother   . Stroke Mother   . Macular degeneration Mother   . Diabetes Father   . Arthritis Other        Parent  . Hypertension Other        Parent  . Diabetes Other        Parent  . Heart attack Brother   . Kidney cancer Brother   . Bladder Cancer Neg Hx   . Prostate cancer Neg Hx     Social History:  reports that he has never smoked. He has never used smokeless tobacco. He reports current alcohol use of about 2.0 standard drinks of alcohol per week. He reports that he does not use drugs.   Physical Exam: BP (!) 149/79   Pulse 71   Ht 6' (1.829 m)   Wt 250 lb (113.4 kg)   BMI 33.91 kg/m   Constitutional:  Alert and oriented, No acute distress. HEENT: [place] AT, moist mucus membranes.  Trachea midline, no masses. Cardiovascular: No  clubbing, cyanosis, or edema. Respiratory: Normal respiratory effort, no increased work of breathing. Skin: No rashes, bruises or suspicious lesions. Neurologic: Grossly intact, no focal deficits, moving all 4 extremities. Psychiatric: Normal mood and affect.  Pertinent Imaging: Results for orders placed or performed in visit on 04/19/21  BLADDER SCAN AMB NON-IMAGING  Result Value Ref Range   Scan Result 62 ml     Assessment & Plan:    1. Benign prostatic hyperplasia with urinary hesitancy Status post UroLift with improvement of his obstructive urinary symptoms  He did have a slight setback a few weeks ago which may be related to prostatic inflammation, has a personal history of chronic prostatitis  Okay to continue Rapaflo for the time being but urged him if his symptoms start to improve, another trial without an alpha-blocker  Reassess symptoms in 3 months with IPSS/ PVR  - BLADDER SCAN AMB NON-IMAGING   Hollice Espy, MD  [facility] [address] [place], [place] [address] [phone]

## 2021-04-20 ENCOUNTER — Encounter: Payer: Self-pay | Admitting: Urology

## 2021-04-26 ENCOUNTER — Other Ambulatory Visit: Payer: Self-pay | Admitting: Urology

## 2021-04-26 DIAGNOSIS — N411 Chronic prostatitis: Secondary | ICD-10-CM

## 2021-04-27 DIAGNOSIS — L409 Psoriasis, unspecified: Secondary | ICD-10-CM | POA: Diagnosis not present

## 2021-04-27 DIAGNOSIS — L4050 Arthropathic psoriasis, unspecified: Secondary | ICD-10-CM | POA: Diagnosis not present

## 2021-04-27 DIAGNOSIS — Z79899 Other long term (current) drug therapy: Secondary | ICD-10-CM | POA: Diagnosis not present

## 2021-04-27 LAB — COLOGUARD: COLOGUARD: POSITIVE — AB

## 2021-05-02 ENCOUNTER — Ambulatory Visit: Payer: Medicare Other

## 2021-05-02 ENCOUNTER — Ambulatory Visit
Admission: RE | Admit: 2021-05-02 | Discharge: 2021-05-02 | Disposition: A | Discharge status: Home / Self Care | Payer: Medicare Other | Source: Ambulatory Visit | Attending: Neurology | Admitting: Neurology

## 2021-05-02 ENCOUNTER — Other Ambulatory Visit: Payer: Self-pay

## 2021-05-02 DIAGNOSIS — I671 Cerebral aneurysm, nonruptured: Secondary | ICD-10-CM | POA: Diagnosis not present

## 2021-05-02 DIAGNOSIS — G459 Transient cerebral ischemic attack, unspecified: Secondary | ICD-10-CM | POA: Insufficient documentation

## 2021-05-02 IMAGING — MR MR MRA HEAD W/O CM
2 series · 17 of 48 positions shown · non-contrast
Comparison: Comparison made with previous brain MRI from
[DATE], as well as prior CTA chest from [DATE].
Additionally, comparison made with prior outside chest CTA report
from [DATE].

CLINICAL DATA: Initial evaluation for memory loss, double vision
for 2 months, temporary loss of vision in right eye.

EXAM:
MRA HEAD WITHOUT CONTRAST
MRA NECK WITHOUT CONTRAST
TECHNIQUE: Angiographic images of the Circle of Willis were obtained using MRA
technique without intravenous contrast. Angiographic images of the
neck were obtained using MRA technique without intravenous contrast.
Carotid stenosis measurements (when applicable) are obtained
utilizing NASCET criteria, using the distal internal carotid
diameter as the denominator.

[Series 9: TOF · axial · 0.5mm · 0.41mm/px · z∈[-46,+48]mm · 16 of 205 slices shown]
[im 1/205]
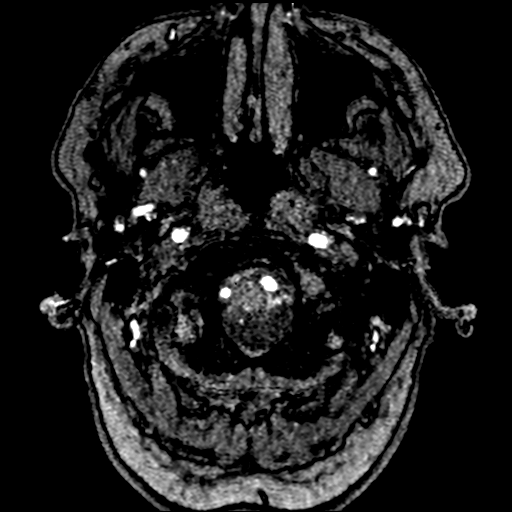
[im 5/205]
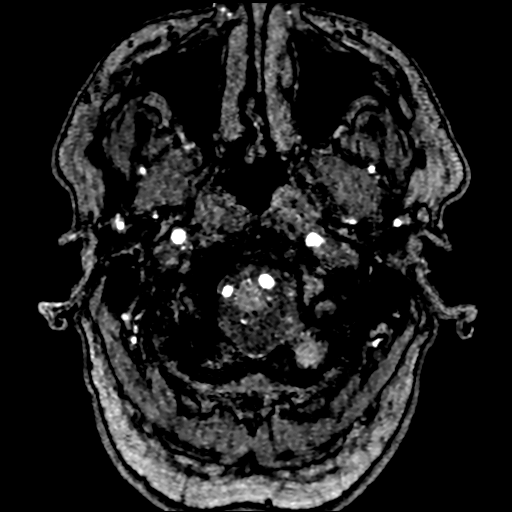
[im 9/205]
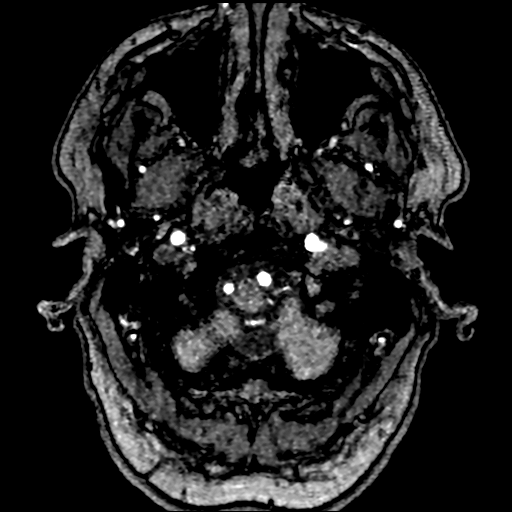
[im 14/205]
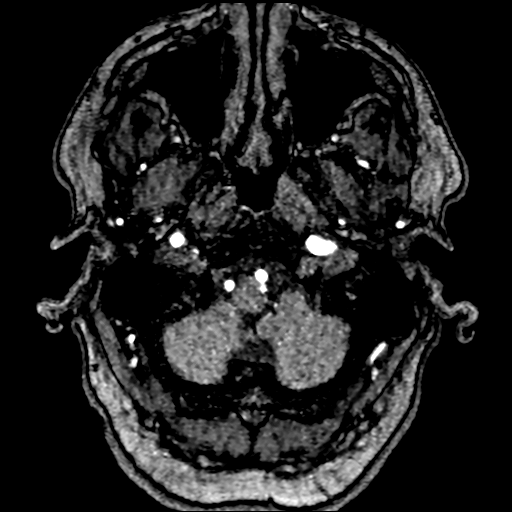
[im 18/205]
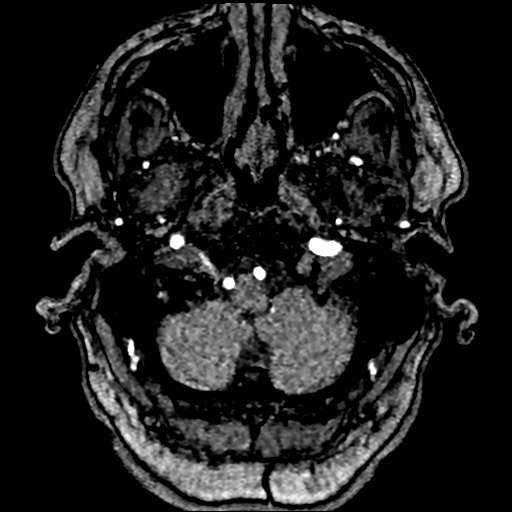
[im 23/205]
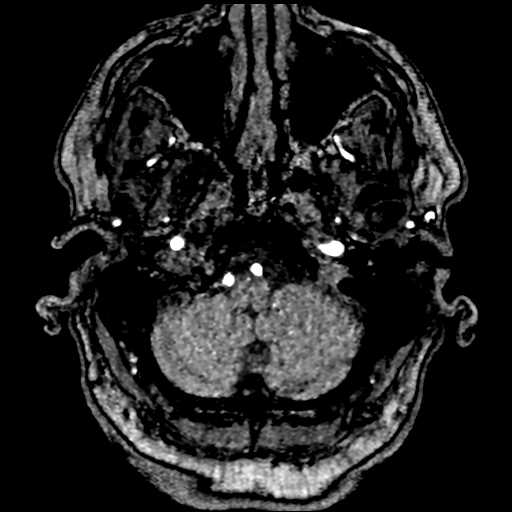
[im 32/205]
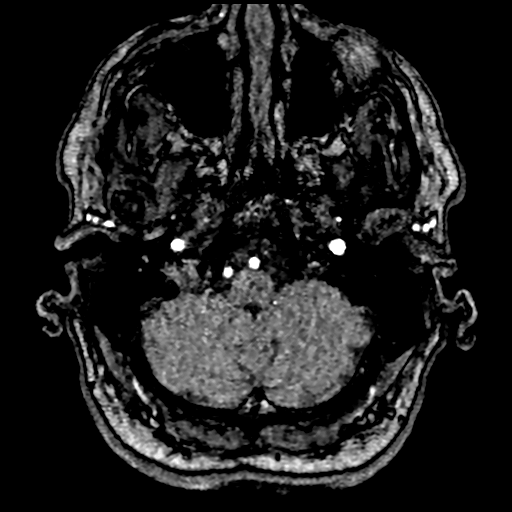
[im 36/205]
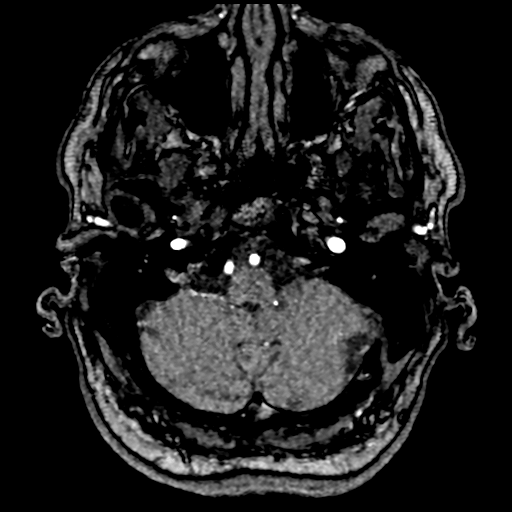
[im 63/205]
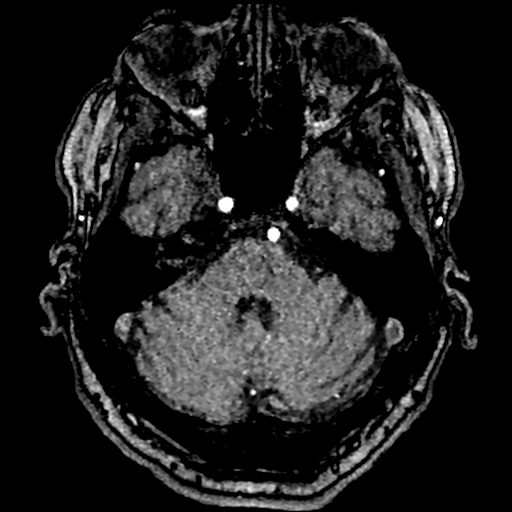
[im 89/205]
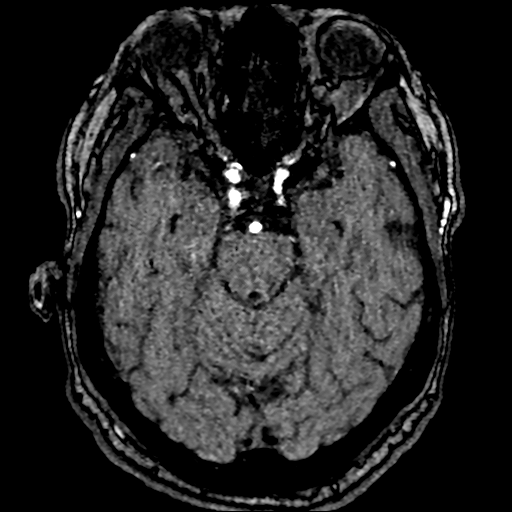
[im 103/205]
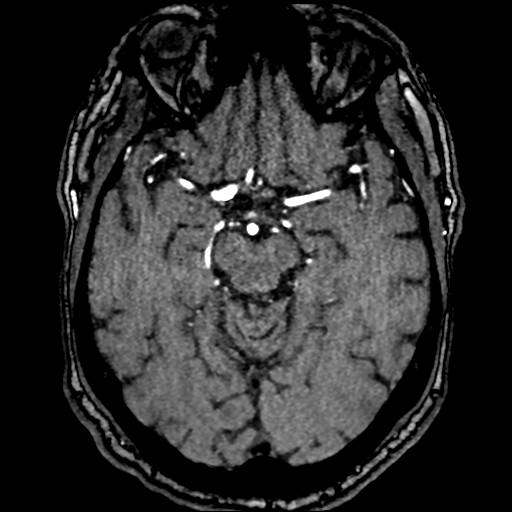
[im 116/205]
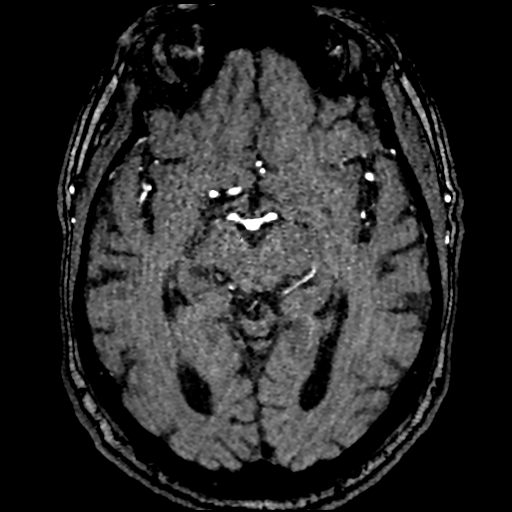
[im 142/205]
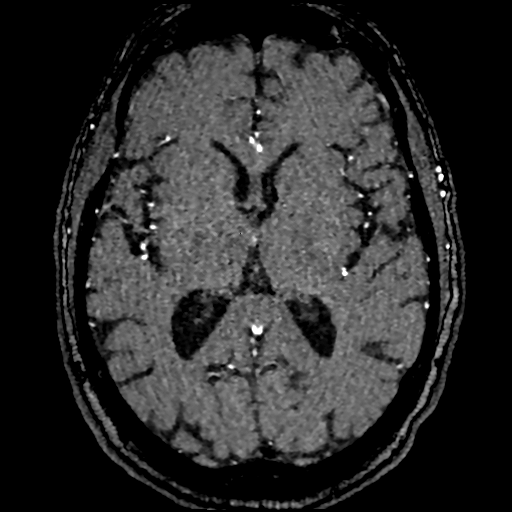
[im 169/205]
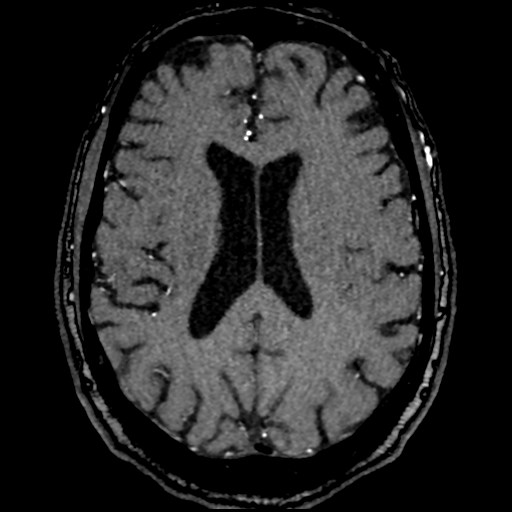
[im 173/205]
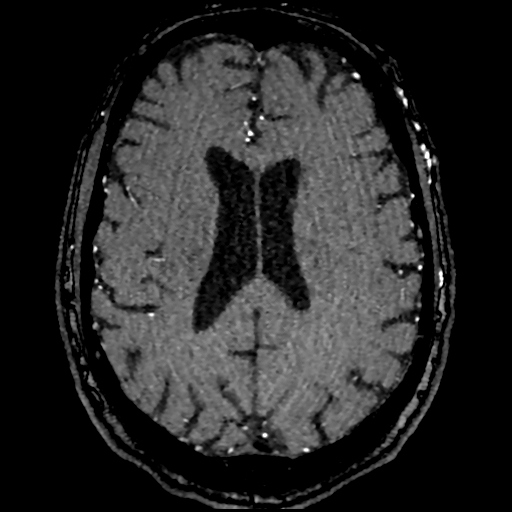
[im 196/205]
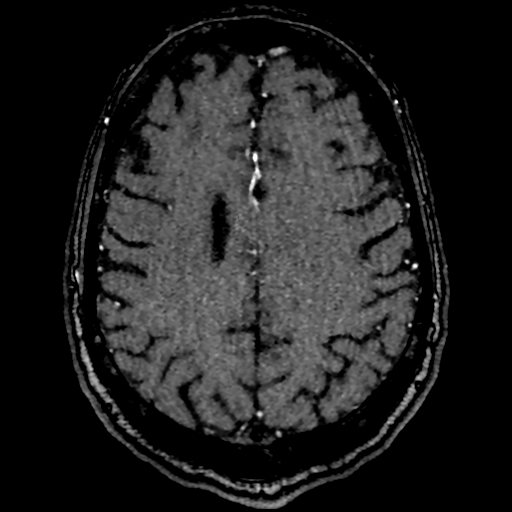

[Series 1045: posterior rotate · axial · 0.6mm · 0.57mm/px · 1 of 1 slices shown]
[im 1/1]
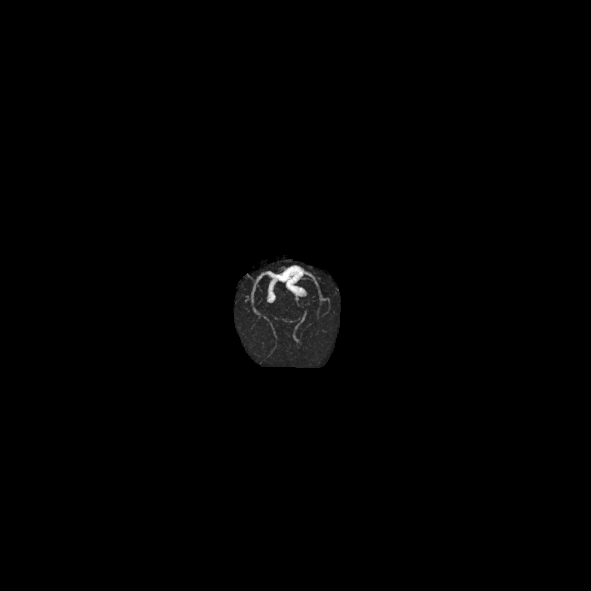

[17 of 48 positions shown; findings below may reference images not displayed]

FINDINGS: MRA HEAD FINDINGS

ANTERIOR CIRCULATION:

Visualized distal cervical segments of both internal carotid
arteries are patent with antegrade flow. Distal cervical left ICA
mildly tortuous. Petrous, cavernous, and supraclinoid segments
widely patent without stenosis or other abnormality. A1 segments
patent bilaterally. Normal anterior communicating artery complex.
Anterior cerebral arteries patent to their distal aspects without
stenosis. No M1 stenosis or occlusion. Normal MCA bifurcations.
Distal MCA branches well perfused and symmetric.

POSTERIOR CIRCULATION:

Both vertebral arteries patent to the vertebrobasilar junction
without stenosis. Both PICA origins patent and normal. Basilar
widely patent to its distal aspect. Superior cerebellar arteries
patent bilaterally. Both PCAs primarily supplied via the basilar
well perfused to their distal aspects.

No intracranial aneurysm or other vascular abnormality.

MRA NECK FINDINGS

AORTIC ARCH: Aneurysmal dilatation of the visualized aortic arch
measuring up to approximately 4.4 cm, similar in comparison with
prior CTA report from [DATE]. Residual/persistent dissection
flap at the level of the aortic arch, with the true lumen
demonstrating slightly higher signal intensity as compared to the
false lumen. The innominate, left common carotid, and left
subclavian arteries appear to largely be supplied via the true
lumen.

RIGHT CAROTID SYSTEM: Right CCA tortuous proximally but widely
patent to the bifurcation. No significant atheromatous narrowing or
irregularity about the right carotid bulb. Right ICA mildly tortuous
but widely patent without stenosis, evidence for dissection, or
occlusion.

LEFT CAROTID SYSTEM: Left CCA tortuous proximally but widely patent
to the bifurcation without stenosis. No significant atheromatous
narrowing or irregularity about the left bifurcation. Left ICA
tortuous but widely patent without stenosis, evidence for dissection
or occlusion.

VERTEBRAL ARTERIES: Both vertebral arteries arise from the
subclavian arteries. Left vertebral artery slightly dominant.
Vertebral arteries patent without stenosis, evidence for dissection
or occlusion.
IMPRESSION: MRA NECK IMPRESSION:

1. Aneurysmal dilatation of the aortic arch with residual dissection
flap. Per history, patient is status post graft repair of this
aortic dissection, better evaluated on outside chest CT from
[DATE]. No propagation of the dissection flap distally into the
major arterial vasculature of the neck.
2. Wide patency of both carotid artery systems and vertebral
arteries in the neck.

MRA HEAD IMPRESSION:

Normal intracranial MRA. No large vessel occlusion, hemodynamically
significant stenosis, or other vascular abnormality. No aneurysm.

## 2021-05-02 IMAGING — MR MR MRA NECK W/O CM
1 series · 36 of 48 positions shown · non-contrast
Comparison: Comparison made with previous brain MRI from
[DATE], as well as prior CTA chest from [DATE].
Additionally, comparison made with prior outside chest CTA report
from [DATE].

CLINICAL DATA: Initial evaluation for memory loss, double vision
for 2 months, temporary loss of vision in right eye.

EXAM:
MRA HEAD WITHOUT CONTRAST
MRA NECK WITHOUT CONTRAST
TECHNIQUE: Angiographic images of the Circle of Willis were obtained using MRA
technique without intravenous contrast. Angiographic images of the
neck were obtained using MRA technique without intravenous contrast.
Carotid stenosis measurements (when applicable) are obtained
utilizing NASCET criteria, using the distal internal carotid
diameter as the denominator.

[Series 9: TOF · axial · 0.6mm · 0.52mm/px · z∈[-280,-80]mm · 36 of 350 slices shown]
[im 1/350]
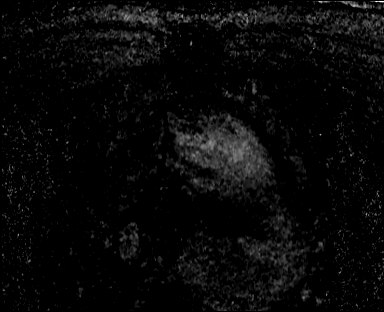
[im 8/350]
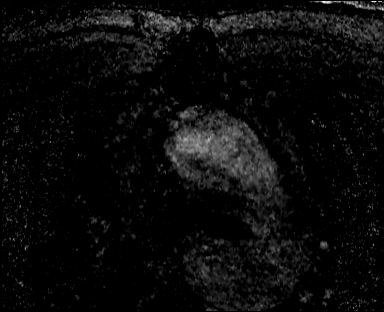
[im 15/350]
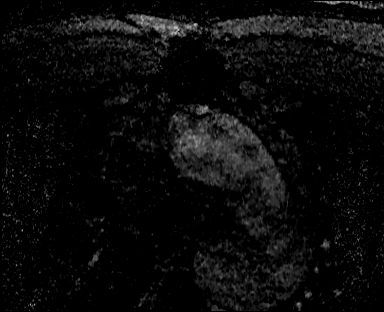
[im 23/350]
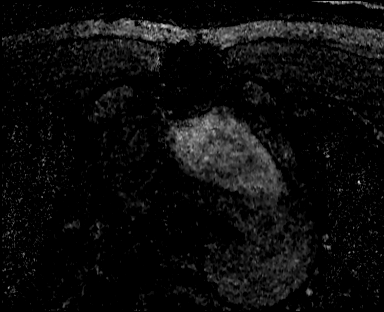
[im 30/350]
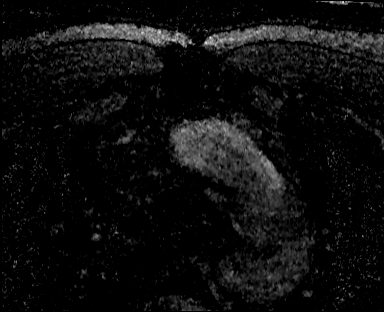
[im 38/350]
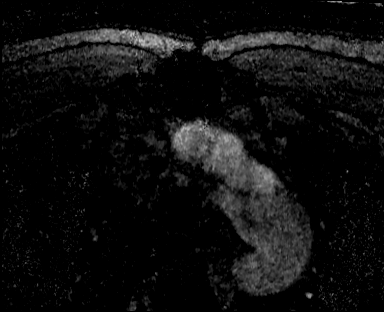
[im 45/350]
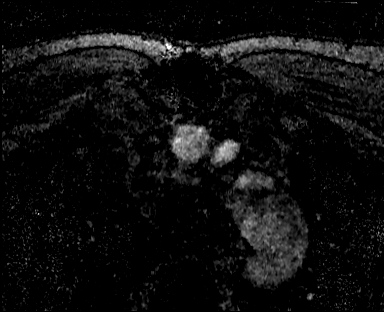
[im 52/350]
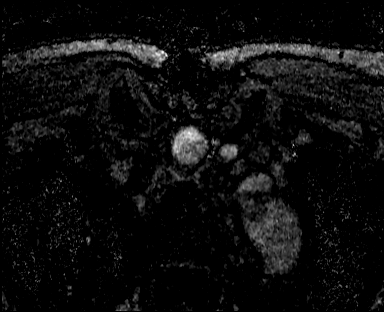
[im 60/350]
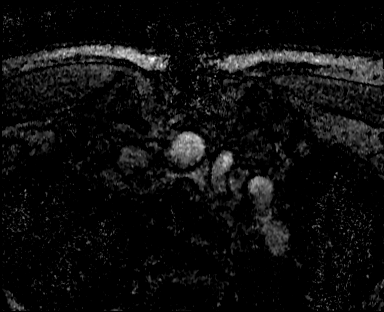
[im 67/350]
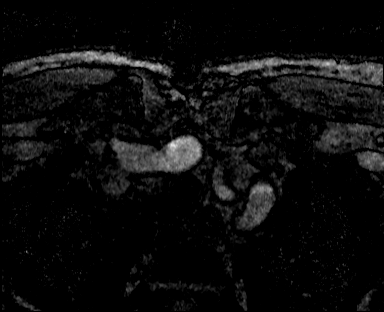
[im 75/350]
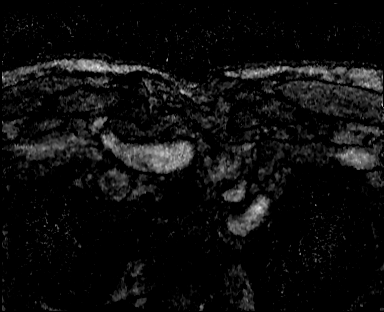
[im 82/350]
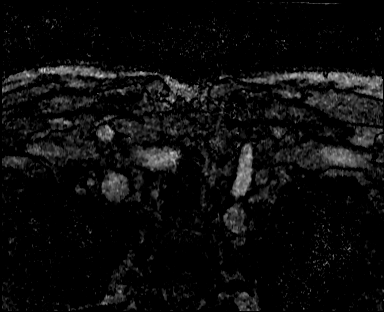
[im 90/350]
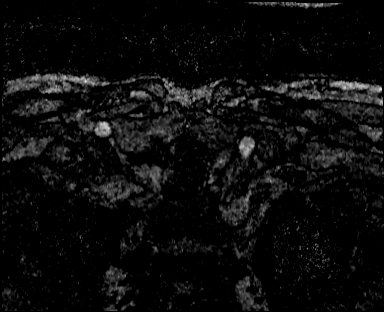
[im 97/350]
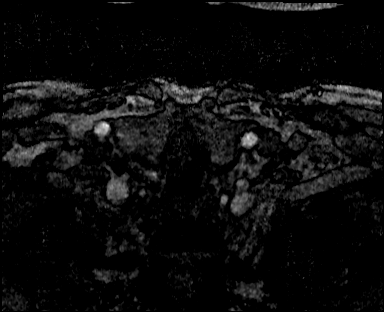
[im 104/350]
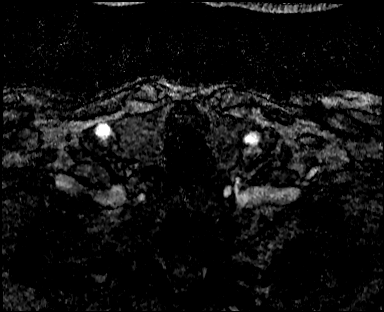
[im 112/350]
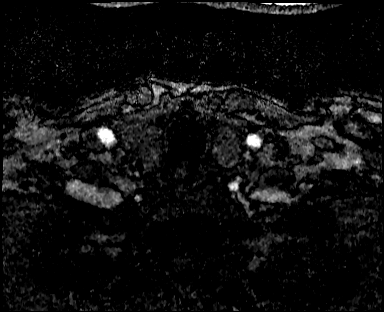
[im 119/350]
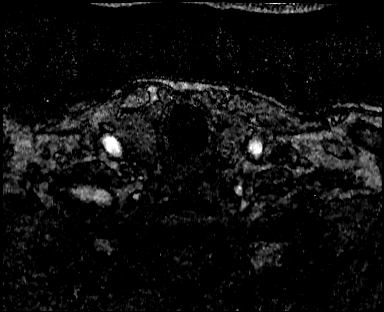
[im 127/350]
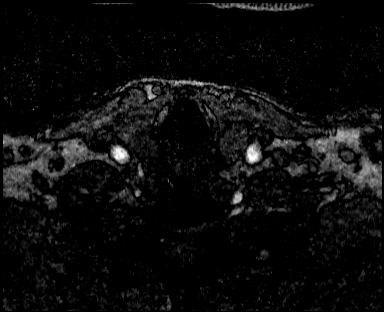
[im 134/350]
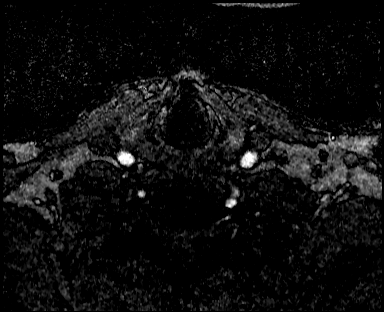
[im 142/350]
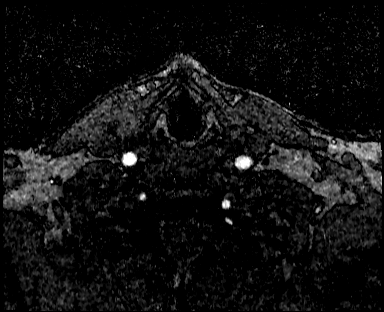
[im 149/350]
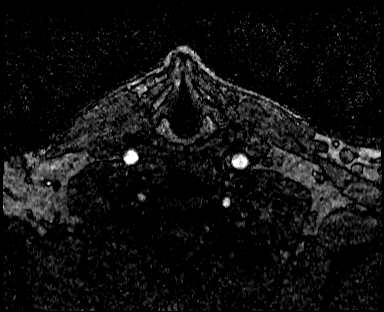
[im 156/350]
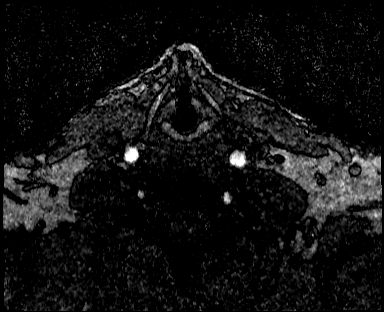
[im 164/350]
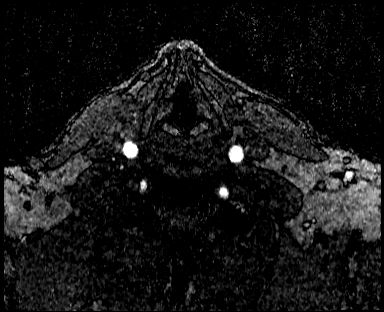
[im 171/350]
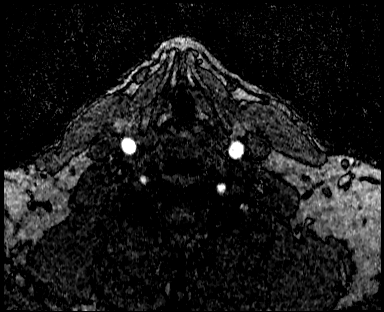
[im 179/350]
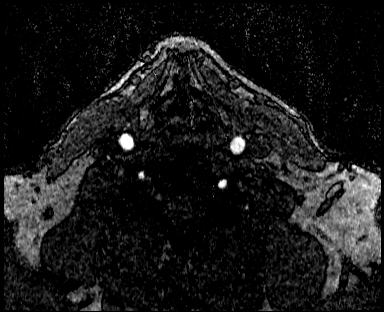
[im 186/350]
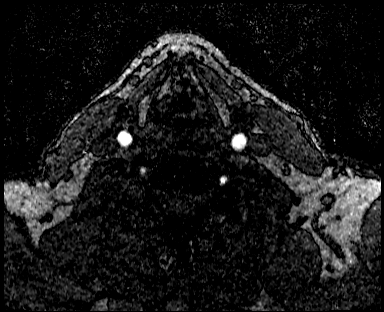
[im 194/350]
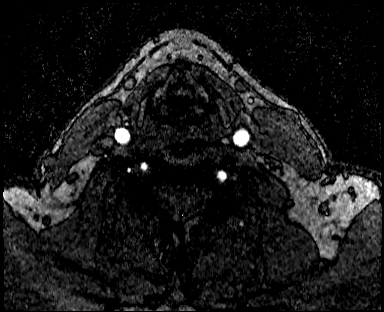
[im 201/350]
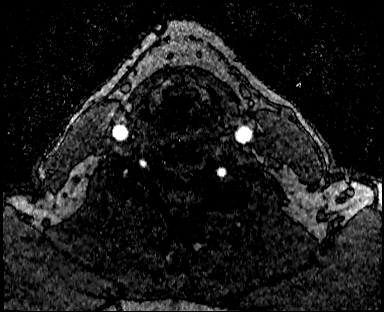
[im 208/350]
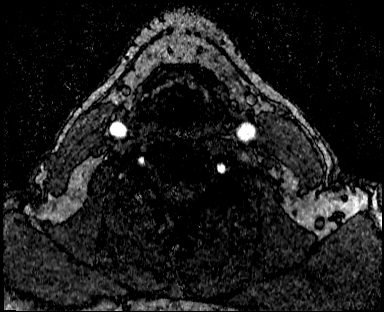
[im 216/350]
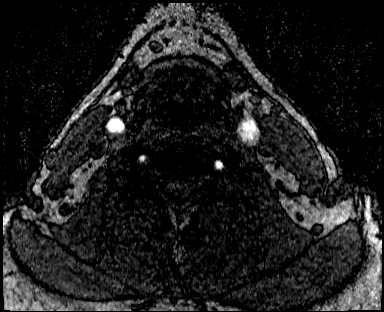
[im 223/350]
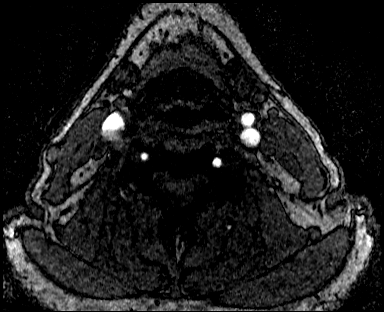
[im 231/350]
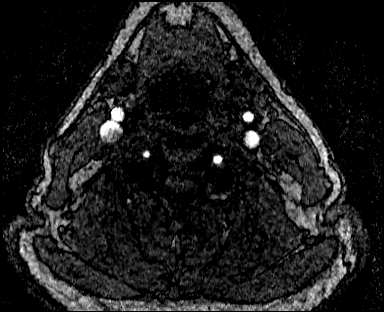
[im 246/350]
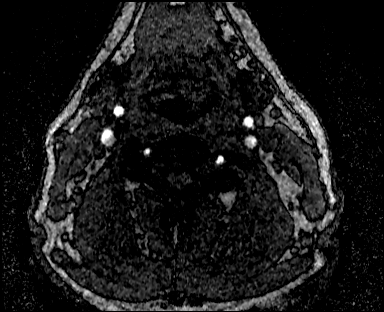
[im 290/350]
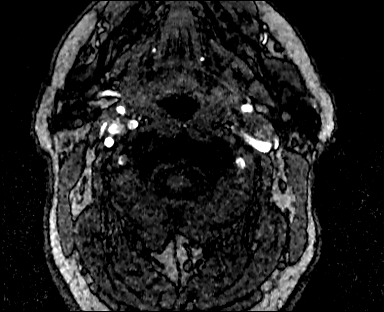
[im 298/350]
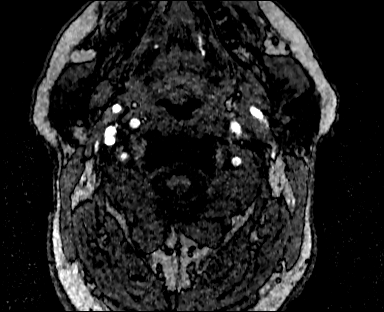
[im 335/350]
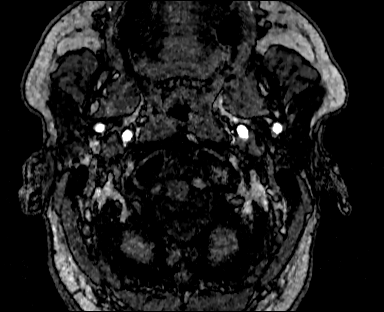

[36 of 48 positions shown; findings below may reference images not displayed]

FINDINGS: MRA HEAD FINDINGS

ANTERIOR CIRCULATION:

Visualized distal cervical segments of both internal carotid
arteries are patent with antegrade flow. Distal cervical left ICA
mildly tortuous. Petrous, cavernous, and supraclinoid segments
widely patent without stenosis or other abnormality. A1 segments
patent bilaterally. Normal anterior communicating artery complex.
Anterior cerebral arteries patent to their distal aspects without
stenosis. No M1 stenosis or occlusion. Normal MCA bifurcations.
Distal MCA branches well perfused and symmetric.

POSTERIOR CIRCULATION:

Both vertebral arteries patent to the vertebrobasilar junction
without stenosis. Both PICA origins patent and normal. Basilar
widely patent to its distal aspect. Superior cerebellar arteries
patent bilaterally. Both PCAs primarily supplied via the basilar
well perfused to their distal aspects.

No intracranial aneurysm or other vascular abnormality.

MRA NECK FINDINGS

AORTIC ARCH: Aneurysmal dilatation of the visualized aortic arch
measuring up to approximately 4.4 cm, similar in comparison with
prior CTA report from [DATE]. Residual/persistent dissection
flap at the level of the aortic arch, with the true lumen
demonstrating slightly higher signal intensity as compared to the
false lumen. The innominate, left common carotid, and left
subclavian arteries appear to largely be supplied via the true
lumen.

RIGHT CAROTID SYSTEM: Right CCA tortuous proximally but widely
patent to the bifurcation. No significant atheromatous narrowing or
irregularity about the right carotid bulb. Right ICA mildly tortuous
but widely patent without stenosis, evidence for dissection, or
occlusion.

LEFT CAROTID SYSTEM: Left CCA tortuous proximally but widely patent
to the bifurcation without stenosis. No significant atheromatous
narrowing or irregularity about the left bifurcation. Left ICA
tortuous but widely patent without stenosis, evidence for dissection
or occlusion.

VERTEBRAL ARTERIES: Both vertebral arteries arise from the
subclavian arteries. Left vertebral artery slightly dominant.
Vertebral arteries patent without stenosis, evidence for dissection
or occlusion.
IMPRESSION: MRA NECK IMPRESSION:

1. Aneurysmal dilatation of the aortic arch with residual dissection
flap. Per history, patient is status post graft repair of this
aortic dissection, better evaluated on outside chest CT from
[DATE]. No propagation of the dissection flap distally into the
major arterial vasculature of the neck.
2. Wide patency of both carotid artery systems and vertebral
arteries in the neck.

MRA HEAD IMPRESSION:

Normal intracranial MRA. No large vessel occlusion, hemodynamically
significant stenosis, or other vascular abnormality. No aneurysm.

## 2021-05-11 ENCOUNTER — Other Ambulatory Visit: Payer: Self-pay | Admitting: Urology

## 2021-05-11 DIAGNOSIS — N411 Chronic prostatitis: Secondary | ICD-10-CM

## 2021-05-16 DIAGNOSIS — H353211 Exudative age-related macular degeneration, right eye, with active choroidal neovascularization: Secondary | ICD-10-CM | POA: Diagnosis not present

## 2021-06-10 ENCOUNTER — Encounter: Payer: Self-pay | Admitting: Urology

## 2021-06-10 DIAGNOSIS — N411 Chronic prostatitis: Secondary | ICD-10-CM

## 2021-06-12 DIAGNOSIS — E782 Mixed hyperlipidemia: Secondary | ICD-10-CM | POA: Diagnosis not present

## 2021-06-12 DIAGNOSIS — E119 Type 2 diabetes mellitus without complications: Secondary | ICD-10-CM | POA: Diagnosis not present

## 2021-06-12 DIAGNOSIS — G4733 Obstructive sleep apnea (adult) (pediatric): Secondary | ICD-10-CM | POA: Diagnosis not present

## 2021-06-12 DIAGNOSIS — E669 Obesity, unspecified: Secondary | ICD-10-CM | POA: Diagnosis not present

## 2021-06-12 DIAGNOSIS — R195 Other fecal abnormalities: Secondary | ICD-10-CM | POA: Diagnosis not present

## 2021-06-12 DIAGNOSIS — I4891 Unspecified atrial fibrillation: Secondary | ICD-10-CM | POA: Diagnosis not present

## 2021-06-12 DIAGNOSIS — I10 Essential (primary) hypertension: Secondary | ICD-10-CM | POA: Diagnosis not present

## 2021-06-12 MED ORDER — SILODOSIN 8 MG PO CAPS: 8.0000 mg | ORAL_CAPSULE | Freq: Every day | ORAL | 1 refills | Status: DC

## 2021-06-13 DIAGNOSIS — I10 Essential (primary) hypertension: Secondary | ICD-10-CM | POA: Diagnosis not present

## 2021-06-13 DIAGNOSIS — G4733 Obstructive sleep apnea (adult) (pediatric): Secondary | ICD-10-CM | POA: Diagnosis not present

## 2021-06-13 DIAGNOSIS — E782 Mixed hyperlipidemia: Secondary | ICD-10-CM | POA: Diagnosis not present

## 2021-06-20 DIAGNOSIS — H353211 Exudative age-related macular degeneration, right eye, with active choroidal neovascularization: Secondary | ICD-10-CM | POA: Diagnosis not present

## 2021-07-04 DIAGNOSIS — G459 Transient cerebral ischemic attack, unspecified: Secondary | ICD-10-CM | POA: Diagnosis not present

## 2021-07-11 DIAGNOSIS — E782 Mixed hyperlipidemia: Secondary | ICD-10-CM | POA: Diagnosis not present

## 2021-07-11 DIAGNOSIS — I7101 Dissection of thoracic aorta: Secondary | ICD-10-CM | POA: Diagnosis not present

## 2021-07-11 DIAGNOSIS — G4733 Obstructive sleep apnea (adult) (pediatric): Secondary | ICD-10-CM | POA: Diagnosis not present

## 2021-07-11 DIAGNOSIS — E669 Obesity, unspecified: Secondary | ICD-10-CM | POA: Diagnosis not present

## 2021-07-11 DIAGNOSIS — R195 Other fecal abnormalities: Secondary | ICD-10-CM | POA: Diagnosis not present

## 2021-07-11 DIAGNOSIS — Z9889 Other specified postprocedural states: Secondary | ICD-10-CM | POA: Diagnosis not present

## 2021-07-11 DIAGNOSIS — Z8679 Personal history of other diseases of the circulatory system: Secondary | ICD-10-CM | POA: Diagnosis not present

## 2021-07-11 DIAGNOSIS — I10 Essential (primary) hypertension: Secondary | ICD-10-CM | POA: Diagnosis not present

## 2021-07-11 DIAGNOSIS — K219 Gastro-esophageal reflux disease without esophagitis: Secondary | ICD-10-CM | POA: Diagnosis not present

## 2021-07-12 ENCOUNTER — Encounter: Payer: Self-pay | Admitting: Emergency Medicine

## 2021-07-13 ENCOUNTER — Ambulatory Visit: Payer: Medicare Other | Admitting: Anesthesiology

## 2021-07-13 ENCOUNTER — Ambulatory Visit
Admission: RE | Admit: 2021-07-13 | Discharge: 2021-07-13 | Disposition: A | Discharge status: Home / Self Care | Payer: Medicare Other | Attending: Gastroenterology | Admitting: Gastroenterology

## 2021-07-13 ENCOUNTER — Encounter: Payer: Self-pay | Admitting: Anesthesiology

## 2021-07-13 ENCOUNTER — Encounter
Admission: RE | Disposition: A | Discharge status: Home / Self Care | Payer: Self-pay | Source: Home / Self Care | Attending: Gastroenterology

## 2021-07-13 DIAGNOSIS — Z79899 Other long term (current) drug therapy: Secondary | ICD-10-CM | POA: Insufficient documentation

## 2021-07-13 DIAGNOSIS — K635 Polyp of colon: Secondary | ICD-10-CM | POA: Insufficient documentation

## 2021-07-13 DIAGNOSIS — R195 Other fecal abnormalities: Secondary | ICD-10-CM | POA: Diagnosis not present

## 2021-07-13 DIAGNOSIS — Z888 Allergy status to other drugs, medicaments and biological substances status: Secondary | ICD-10-CM | POA: Insufficient documentation

## 2021-07-13 DIAGNOSIS — K640 First degree hemorrhoids: Secondary | ICD-10-CM | POA: Diagnosis not present

## 2021-07-13 DIAGNOSIS — D120 Benign neoplasm of cecum: Secondary | ICD-10-CM | POA: Diagnosis not present

## 2021-07-13 DIAGNOSIS — Z7984 Long term (current) use of oral hypoglycemic drugs: Secondary | ICD-10-CM | POA: Insufficient documentation

## 2021-07-13 DIAGNOSIS — K5900 Constipation, unspecified: Secondary | ICD-10-CM | POA: Diagnosis not present

## 2021-07-13 DIAGNOSIS — K5730 Diverticulosis of large intestine without perforation or abscess without bleeding: Secondary | ICD-10-CM | POA: Insufficient documentation

## 2021-07-13 DIAGNOSIS — D123 Benign neoplasm of transverse colon: Secondary | ICD-10-CM | POA: Diagnosis not present

## 2021-07-13 DIAGNOSIS — K649 Unspecified hemorrhoids: Secondary | ICD-10-CM | POA: Diagnosis not present

## 2021-07-13 DIAGNOSIS — K219 Gastro-esophageal reflux disease without esophagitis: Secondary | ICD-10-CM | POA: Diagnosis not present

## 2021-07-13 DIAGNOSIS — K5790 Diverticulosis of intestine, part unspecified, without perforation or abscess without bleeding: Secondary | ICD-10-CM | POA: Diagnosis not present

## 2021-07-13 HISTORY — PX: COLONOSCOPY: SHX5424

## 2021-07-13 LAB — GLUCOSE, CAPILLARY: Glucose-Capillary: 118 mg/dL — ABNORMAL HIGH (ref 70–99)

## 2021-07-13 SURGERY — COLONOSCOPY
Anesthesia: General

## 2021-07-13 MED ORDER — LIDOCAINE HCL (CARDIAC) PF 100 MG/5ML IV SOSY
PREFILLED_SYRINGE | INTRAVENOUS | Status: DC | PRN
  Administered 2021-07-13: 80 mg via INTRAVENOUS

## 2021-07-13 MED ORDER — PHENYLEPHRINE HCL (PRESSORS) 10 MG/ML IV SOLN
INTRAVENOUS | Status: DC | PRN
  Administered 2021-07-13 (×2): 50 ug via INTRAVENOUS
  Administered 2021-07-13 (×2): 100 ug via INTRAVENOUS

## 2021-07-13 MED ORDER — LIDOCAINE HCL (PF) 2 % IJ SOLN
INTRAMUSCULAR | Status: AC
  Filled 2021-07-13: qty 5

## 2021-07-13 MED ORDER — PROPOFOL 500 MG/50ML IV EMUL
INTRAVENOUS | Status: AC
  Filled 2021-07-13: qty 50

## 2021-07-13 MED ORDER — PROPOFOL 500 MG/50ML IV EMUL
INTRAVENOUS | Status: DC | PRN
  Administered 2021-07-13: 150 ug/kg/min via INTRAVENOUS

## 2021-07-13 MED ORDER — SODIUM CHLORIDE 0.9 % IV SOLN
INTRAVENOUS | Status: DC
  Administered 2021-07-13: 1000 mL via INTRAVENOUS

## 2021-07-13 NOTE — Interval H&P Note (Signed)
History and Physical Interval Note: Preprocedure H&P from 07/13/21  was reviewed and there was no interval change after seeing and examining the patient.  Written consent was obtained from the patient after discussion of risks, benefits, and alternatives. Patient has consented to proceed with Colonoscopy with possible intervention   07/13/2021 1:12 PM  Wayne Scott  has presented today for surgery, with the diagnosis of (R19.5) POSITIVE COLORECTAL CANCER SCREENING USING COLOGUARD TEST.  The various methods of treatment have been discussed with the patient and family. After consideration of risks, benefits and other options for treatment, the patient has consented to  Procedure(s): COLONOSCOPY (N/A) as a surgical intervention.  The patient's history has been reviewed, patient examined, no change in status, stable for surgery.  I have reviewed the patient's chart and labs.  Questions were answered to the patient's satisfaction.     Annamaria Helling

## 2021-07-13 NOTE — H&P (Signed)
[facility] Pre-Procedure H&P   Patient ID: Wayne Scott is a 70 y.o. male.  Gastroenterology Provider: Annamaria Helling, DO  Referring Provider: Octavia Bruckner, PA PCP: Wayne Ramsay, MD  Date: 07/13/2021  HPI [name] is a 70 y.o. male who presents today for Colonoscopy for or positive colorectal screening testing (cologuard). Patient has had some constipation, but takes miralax prn which works well for him. Otherwise, denies melena, hematochezia, other changes in bowel habits, weight loss, abdominal pain, fevers/chills/nightsweats. No longer taking plavix. Normal abdomen ct march 2022; hgb 16.1 most recently No fhx of crc or advanced polyps Last colonoscopy in [place] >15 y ago. No other acute gi complaints. Cleared for endoscopy by his cardiologist per notation  Past Medical History:  Diagnosis Date   Allergic rhinitis    Anxiety    Aortic aneurysm and dissection ([facility]) 07/2020   Arthritis    Diabetes mellitus without complication (HCC)    GERD (gastroesophageal reflux disease)    Headache    Heart murmur    History of kidney stones    HLD (hyperlipidemia)    Hypertension    Macular degeneration    OSA on CPAP    Paroxysmal atrial fibrillation (HCC)    Psoriasis     Past Surgical History:  Procedure Laterality Date   ASCENDING AORTA GRAFT WITH CARDIOPULMONARY BYPASS; VALVE SUSPENSION WITH CORONARY RECONSTRUCTION AND VALVE-SPARING AORTIC ROOT REMODELING (DAVID PROCEDURE, YACOUB PROCEDURE)  08/11/2020   performed at [place] N/A 03/13/2021   Procedure: [place];  Surgeon: Hollice Espy, MD;  Location: [facility] OR[facility];  Service: Urology;  Laterality: N/A;   EXTRACORPOREAL SHOCK WAVE LITHOTRIPSY Left 11/14/2017   Procedure: EXTRACORPOREAL SHOCK WAVE LITHOTRIPSY (ESWL);  Surgeon: Hollice Espy, MD;  Location: [facility];  Service: Urology;   Laterality: Left;   EYE SURGERY     NASAL SINUS SURGERY  1991   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  HENT:  Negative for trouble swallowing and voice change.   Respiratory:  Negative for shortness of breath.   Cardiovascular:  Negative for chest pain and palpitations.  Gastrointestinal:  Negative for abdominal distention, abdominal pain, anal bleeding, blood in stool, constipation, diarrhea, nausea and vomiting.  Musculoskeletal:  Negative for arthralgias and myalgias.  Skin:  Negative for color change and pallor.  Neurological:  Negative for dizziness, syncope and weakness.  Psychiatric/Behavioral:  Negative for confusion. The patient is not nervous/anxious.   All other systems reviewed and are negative.   Medications No current facility-administered medications on file prior to encounter.   Current Outpatient Medications on File Prior to Encounter  Medication Sig Dispense Refill   ezetimibe (ZETIA) 10 MG tablet Take 10 mg by mouth daily.     acetaminophen (TYLENOL) 500 MG tablet Take 1,000 mg by mouth every 6 (six) hours as needed for moderate pain.     acetic acid-hydrocortisone (VOSOL-HC) OTIC solution Place 1 drop into both ears daily as needed (irritation).     amLODipine (NORVASC) 10 MG tablet Take 10 mg by mouth daily.     B Complex-C (SUPER B COMPLEX PO) Take 1 tablet by mouth daily.  blood glucose meter kit and supplies KIT Dispense based on patient and insurance preference. Use once daily as directed. (FOR ICD-10 E11.9). 1 each 0   cholecalciferol (VITAMIN D) 1000 units tablet Take 1,000 Units by mouth daily.     clopidogrel (PLAVIX) 75 MG tablet Take 1 tablet by mouth daily. (Patient not taking: Reported on 07/13/2021)     fluticasone (FLONASE) 50  MCG/ACT nasal spray Place 2 sprays into both nostrils daily. (Patient taking differently: Place 2 sprays into both nostrils daily as needed for allergies.) 16 g 6   folic acid (FOLVITE) 1 MG tablet Take 1 mg by mouth daily.     HYDROcodone-acetaminophen (NORCO/VICODIN) 5-325 MG tablet Take 1-2 tablets by mouth every 6 (six) hours as needed for moderate pain. 6 tablet 0   latanoprost (XALATAN) 0.005 % ophthalmic solution Place 1 drop into both eyes at bedtime.     metFORMIN (GLUCOPHAGE) 500 MG tablet Take 500 mg by mouth daily.     methotrexate (RHEUMATREX) 2.5 MG tablet Take 12.5 mg by mouth every Tuesday.     metoprolol tartrate (LOPRESSOR) 50 MG tablet Take 50 mg by mouth in the morning and at bedtime.     Multiple Vitamin (MULTIVITAMIN) capsule Take 1 capsule by mouth daily.     Multiple Vitamins-Minerals (PRESERVISION AREDS 2 PO) Take 1 capsule by mouth in the morning and at bedtime.     naproxen sodium (ALEVE) 220 MG tablet Take 220 mg by mouth 2 (two) times daily as needed (pain).     Omega-3 Fatty Acids (FISH OIL) 1000 MG CAPS Take 4,000 mg by mouth daily.     omeprazole (PRILOSEC) 20 MG capsule TAKE 1 CAPSULE DAILY (Patient taking differently: Take 20 mg by mouth daily.) 90 capsule 1   polyethylene glycol powder (GLYCOLAX/MIRALAX) powder TAKE 17 G BY MOUTH DAILY AS NEEDED FOR MILD CONSTIPATION. 527 g 1   Secukinumab (COSENTYX [place]) Inject 1 application into the skin every 30 (thirty) days.     silodosin (RAPAFLO) 8 MG CAPS capsule Take 1 capsule (8 mg total) by mouth daily with breakfast. 90 capsule 1   Turmeric (QC TUMERIC COMPLEX PO) Take 2 capsules by mouth daily.     zolpidem (AMBIEN) 5 MG tablet Take 5 mg by mouth at bedtime as needed (sleep).      Pertinent medications related to GI and procedure were reviewed by me with the patient prior to the procedure   Current Facility-Administered Medications:    0.9 %  sodium chloride infusion, , Intravenous, Continuous, Annamaria Helling, DO, Last Rate: 20 mL/hr at 07/13/21 1245, 1,000 mL at 07/13/21 1245  sodium chloride 1,000 mL (07/13/21 1245)       Allergies  Allergen Reactions   Atorvastatin Other (See Comments)    Leg cramps    Pravastatin Other (See Comments)    Leg cramps    Fluvastatin    Rosuvastatin Other (See Comments)   Allergies were reviewed by me prior to the procedure  Objective    Vitals:   07/13/21 1211  BP: (!) 152/82  Pulse: 68  Resp: 16  Temp: 97.9 F (36.6 C)  TempSrc: Temporal  SpO2: 99%  Weight: 115.7 kg  Height: 6' (1.829 m)   Physical Exam Vitals and nursing note reviewed.  Constitutional:      General: He is not in acute distress.    Appearance: Normal appearance. He is obese. He is not ill-appearing, toxic-appearing or diaphoretic.  HENT:  Head: Normocephalic and atraumatic.     Nose: Nose normal.     Mouth/Throat:     Mouth: Mucous membranes are moist.     Pharynx: Oropharynx is clear.  Eyes:     General: No scleral icterus.    Extraocular Movements: Extraocular movements intact.  Cardiovascular:     Rate and Rhythm: Normal rate and regular rhythm.     Heart sounds: Normal heart sounds. No murmur heard.   No friction rub. No gallop.  Pulmonary:     Effort: Pulmonary effort is normal. No respiratory distress.     Breath sounds: Normal breath sounds. No wheezing, rhonchi or rales.  Abdominal:     General: Abdomen is flat. Bowel sounds are normal. There is no distension.     Palpations: Abdomen is soft.     Tenderness: no abdominal tenderness There is no guarding or rebound.  Musculoskeletal:     Cervical back: Neck supple.     Right lower leg: No edema.     Left lower leg: No edema.  Skin:    General: Skin is warm and dry.     Coloration: Skin is not jaundiced or pale.  Neurological:     General: No focal deficit present.     Mental Status: He is alert and oriented to person, place, and time. Mental status is at baseline.  Psychiatric:         Mood and Affect: Mood normal.        Behavior: Behavior normal.        Thought Content: Thought content normal.        Judgment: Judgment normal.     Assessment:  [name] is a 70 y.o. male  who presents today for Colonoscopy for positive colorectal screening testing (cologuard).  Plan:  Colonoscopy with possible intervention today  Colonoscopy with possible biopsy, control of bleeding, polypectomy, and interventions as necessary has been discussed with the patient/patient representative. Informed consent was obtained from the patient/patient representative after explaining the indication, nature, and risks of the procedure including but not limited to death, bleeding, perforation, missed neoplasm/lesions, cardiorespiratory compromise, and reaction to medications. Opportunity for questions was given and appropriate answers were provided. Patient/patient representative has verbalized understanding is amenable to undergoing the procedure.  Annamaria Helling, DO  [facility]  Portions of the record may have been created with voice recognition software. Occasional wrong-word or 'sound-a-like' substitutions may have occurred due to the inherent limitations of voice recognition software.  Read the chart carefully and recognize, using context, where substitutions may have occurred.

## 2021-07-13 NOTE — Transfer of Care (Signed)
Immediate Anesthesia Transfer of Care Note  Patient: Wayne Scott  Procedure(s) Performed: COLONOSCOPY  Patient Location: PACU  Anesthesia Type:General  Level of Consciousness: awake and sedated  Airway & Oxygen Therapy: Patient Spontanous Breathing and Patient connected to nasal cannula oxygen  Post-op Assessment: Report given to RN and Post -op Vital signs reviewed and stable  Post vital signs: Reviewed and stable  Last Vitals:  Vitals Value Taken Time  BP    Temp    Pulse    Resp    SpO2      Last Pain:  Vitals:   07/13/21 1211  TempSrc: Temporal  PainSc: 0-No pain         Complications: No notable events documented.

## 2021-07-13 NOTE — Anesthesia Procedure Notes (Signed)
Date/Time: 07/13/2021 1:35 PM Performed by: Vaughan Sine Pre-anesthesia Checklist: Patient identified, Emergency Drugs available, Suction available, Patient being monitored and Timeout performed Patient Re-evaluated:Patient Re-evaluated prior to induction Oxygen Delivery Method: Simple face mask Preoxygenation: Pre-oxygenation with 100% oxygen Induction Type: IV induction Ventilation: Oral airway inserted - appropriate to patient size Placement Confirmation: positive ETCO2 and CO2 detector

## 2021-07-13 NOTE — Op Note (Signed)
[facility] Gastroenterology Patient Name: Wayne Scott Procedure Date: 07/13/2021 12:46 PM MRN: [id number] Account #: [id number] Date of Birth: 11-08-1951 Admit Type: Outpatient Age: 70 Room: [facility] ENDO ROOM 1 Gender: Male Note Status: Finalized Procedure:             Colonoscopy Indications:           Positive Cologuard test Providers:             Rueben Bash, DO Referring MD:          Adrian Prows (Referring MD) Medicines:             Monitored Anesthesia Care Complications:         No immediate complications. Estimated blood loss:                         Minimal. Procedure:             Pre-Anesthesia Assessment:                        - Prior to the procedure, a History and Physical was                         performed, and patient medications and allergies were                         reviewed. The patient is competent. The risks and                         benefits of the procedure and the sedation options and                         risks were discussed with the patient. All questions                         were answered and informed consent was obtained.                         Patient identification and proposed procedure were                         verified by the physician, the nurse, the anesthetist                         and the technician in the endoscopy suite. Mental                         Status Examination: alert and oriented. Airway                         Examination: normal oropharyngeal airway and neck                         mobility. Respiratory Examination: clear to                         auscultation. CV Examination: RRR, no murmurs, no S3  or S4. Prophylactic Antibiotics: The patient does not                         require prophylactic antibiotics. Prior                         Anticoagulants: The patient has taken no previous                         anticoagulant or antiplatelet agents.  ASA Grade                         Assessment: III - A patient with severe systemic                         disease. After reviewing the risks and benefits, the                         patient was deemed in satisfactory condition to                         undergo the procedure. The anesthesia plan was to use                         monitored anesthesia care (MAC). Immediately prior to                         administration of medications, the patient was                         re-assessed for adequacy to receive sedatives. The                         heart rate, respiratory rate, oxygen saturations,                         blood pressure, adequacy of pulmonary ventilation, and                         response to care were monitored throughout the                         procedure. The physical status of the patient was                         re-assessed after the procedure.                        After obtaining informed consent, the colonoscope was                         passed under direct vision. Throughout the procedure,                         the patient's blood pressure, pulse, and oxygen                         saturations were monitored continuously. The  Colonoscope was introduced through the anus and                         advanced to the the terminal ileum, with                         identification of the appendiceal orifice and IC                         valve. The colonoscopy was performed without                         difficulty. The patient tolerated the procedure well.                         The quality of the bowel preparation was evaluated                         using the BBPS [facility] Bowel Preparation Scale) with                         scores of: Right Colon = 2 (minor amount of residual                         staining, small fragments of stool and/or opaque                         liquid, but mucosa seen well), Transverse Colon = 3                          (entire mucosa seen well with no residual staining,                         small fragments of stool or opaque liquid) and Left                         Colon = 3 (entire mucosa seen well with no residual                         staining, small fragments of stool or opaque liquid).                         The total BBPS score equals 8. The quality of the                         bowel preparation was excellent. The terminal ileum,                         ileocecal valve, appendiceal orifice, and rectum were                         photographed. Findings:      The perianal and digital rectal examinations were normal. Pertinent       negatives include normal sphincter tone.      The terminal ileum appeared normal. Estimated blood loss: none.      A few small-mouthed diverticula were found in the sigmoid colon.  Estimated blood loss: none.      Non-bleeding internal hemorrhoids were found during retroflexion. The       hemorrhoids were Grade I (internal hemorrhoids that do not prolapse).      A 8 mm polyp was found in the cecum. The polyp was sessile. The polyp       was removed with a cold snare. Resection and retrieval were complete.       Estimated blood loss was minimal.      Two sessile polyps were found in the transverse colon. The polyps were 2       to 3 mm in size. These polyps were removed with a cold biopsy forceps.       Resection and retrieval were complete. Estimated blood loss was minimal.      A 6 mm polyp was found in the transverse colon. The polyp was       semi-pedunculated. The polyp was removed with a cold snare. Resection       and retrieval were complete. Estimated blood loss was minimal.      A 2 to 3 mm polyp was found in the descending colon. The polyp was       sessile. The polyp was removed with a cold biopsy forceps. Resection and       retrieval were complete. Estimated blood loss was minimal.      The exam was otherwise without  abnormality on direct and retroflexion       views. No obvious malignancy or advanced adenoma was appreciated Impression:            - The examined portion of the ileum was normal.                        - Diverticulosis in the sigmoid colon.                        - Non-bleeding internal hemorrhoids.                        - One 8 mm polyp in the cecum, removed with a cold                         snare. Resected and retrieved.                        - Two 2 to 3 mm polyps in the transverse colon,                         removed with a cold biopsy forceps. Resected and                         retrieved.                        - One 6 mm polyp in the transverse colon, removed with                         a cold snare. Resected and retrieved.                        - One 2 to 3 mm polyp in the descending colon, removed  with a cold biopsy forceps. Resected and retrieved.                        - The examination was otherwise normal on direct and                         retroflexion views. Recommendation:        - Patient has a contact number available for                         emergencies. The signs and symptoms of potential                         delayed complications were discussed with the patient.                         Return to normal activities tomorrow. Written                         discharge instructions were provided to the patient.                        - Resume previous diet.                        - Continue present medications.                        - Await pathology results.                        - Repeat colonoscopy for surveillance based on                         pathology results.                        - Return to referring physician as previously                         scheduled. Procedure Code(s):     --- Professional ---                        [id number], Colonoscopy, flexible; with removal of                         tumor(s), polyp(s),  or other lesion(s) by snare                         technique                        45380, 59, Colonoscopy, flexible; with biopsy, single                         or multiple Diagnosis Code(s):     --- Professional ---                        K64.0, First degree hemorrhoids  K63.5, Polyp of colon                        R19.5, Other fecal abnormalities                        K57.30, Diverticulosis of large intestine without                         perforation or abscess without bleeding CPT copyright 2019 American Medical Association. All rights reserved. The codes documented in this report are preliminary and upon coder review may  be revised to meet current compliance requirements. Attending Participation:      I personally performed the entire procedure. Volney American, DO Annamaria Helling DO, DO 07/13/2021 2:16:57 PM This report has been signed electronically. Number of Addenda: 0 Note Initiated On: 07/13/2021 12:46 PM Scope Withdrawal Time: 0 hours 30 minutes 36 seconds  Total Procedure Duration: 0 hours 36 minutes 8 seconds  Estimated Blood Loss:  Estimated blood loss was minimal.      [facility]

## 2021-07-13 NOTE — Anesthesia Preprocedure Evaluation (Addendum)
Anesthesia Evaluation  Patient identified by MRN, date of birth, ID band Patient awake    Reviewed: Allergy & Precautions, H&P , NPO status , Patient's Chart, lab work & pertinent test results, reviewed documented beta blocker date and time   Airway Mallampati: III   Neck ROM: full    Dental  (+) Teeth Intact   Pulmonary shortness of breath, sleep apnea and Continuous Positive Airway Pressure Ventilation ,    Pulmonary exam normal        Cardiovascular Exercise Tolerance: Good hypertension, On Medications, Pt. on medications and Pt. on home beta blockers Normal cardiovascular exam+ Valvular Problems/Murmurs (repair of dissecting thoracic aortic aneurysm, [place] type A 08/24/2020 )  Rhythm:regular Rate:Normal     Neuro/Psych  Headaches, Anxiety negative psych ROS   GI/Hepatic Neg liver ROS, GERD  Medicated,  Endo/Other  negative endocrine ROSdiabetes, Oral Hypoglycemic Agents  Renal/GU negative Renal ROS  negative genitourinary   Musculoskeletal  (+) Arthritis , Osteoarthritis,    Abdominal   Peds  Hematology negative hematology ROS (+)   Anesthesia Other Findings . Arthritis  . Diabetes mellitus without complication (CMS-HCC)  . Glaucoma (increased eye pressure)  . 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 Hx of repair of dissecting thoracic aortic aneurysm, [place] type A 08/24/2020  Ascending aortic dissection, hemi-arch repair of Type A Dissection on 08/11/2020  . Hypertension  . Kidney stone  . Macular degeneration  . OSA (obstructive sleep apnea) 08/11/2020  . Prediabetes  . Psoriasis  . Sleep apnea     Reproductive/Obstetrics negative OB ROS                             Anesthesia Physical  Anesthesia Plan  ASA: 3  Anesthesia Plan: General   Post-op Pain Management:    Induction: Intravenous  PONV Risk Score and Plan: 3 and Propofol infusion  and TIVA  Airway Management Planned: Natural Airway and Nasal Cannula  Additional Equipment:   Intra-op Plan:   Post-operative Plan:   Informed Consent: I have reviewed the patients History and Physical, chart, labs and discussed the procedure including the risks, benefits and alternatives for the proposed anesthesia with the patient or authorized representative who has indicated his/her understanding and acceptance.     Dental Advisory Given  Plan Discussed with: CRNA  Anesthesia Plan Comments:        Anesthesia Quick Evaluation

## 2021-07-14 NOTE — Anesthesia Postprocedure Evaluation (Signed)
Anesthesia Post Note  Patient: Wayne Scott  Procedure(s) Performed: COLONOSCOPY  Patient location during evaluation: Phase II Anesthesia Type: General Level of consciousness: awake and alert, awake and oriented Pain management: pain level controlled Vital Signs Assessment: post-procedure vital signs reviewed and stable Respiratory status: spontaneous breathing, nonlabored ventilation and respiratory function stable Cardiovascular status: blood pressure returned to baseline and stable Postop Assessment: no apparent nausea or vomiting Anesthetic complications: no   No notable events documented.   Last Vitals:  Vitals:   07/13/21 1429 07/13/21 1438  BP: 127/75 129/74  Pulse: 74 65  Resp: 14 15  Temp:    SpO2: 97% 99%    Last Pain:  Vitals:   07/13/21 1438  TempSrc:   PainSc: 0-No pain                 Phill Mutter

## 2021-07-17 LAB — SURGICAL PATHOLOGY

## 2021-07-18 DIAGNOSIS — H353211 Exudative age-related macular degeneration, right eye, with active choroidal neovascularization: Secondary | ICD-10-CM | POA: Diagnosis not present

## 2021-07-18 DIAGNOSIS — H353222 Exudative age-related macular degeneration, left eye, with inactive choroidal neovascularization: Secondary | ICD-10-CM | POA: Diagnosis not present

## 2021-07-24 NOTE — Progress Notes (Signed)
07/25/21 3:01 PM   Wayne Scott Apr 12, 1951 [id number]  Referring provider:  Leonel Ramsay, MD [place],  [place] [address] Chief Complaint  Patient presents with   Benign Prostatic Hypertrophy     HPI: Wayne Scott is a 70 y.o.male with a personal history of BPH with irritative voiding symptoms/chronic prostatitis who presents today for follow--up.   On 03/13/2021 he underwent UroLift in the setting of evidence of outlet obstruction. He was last seen in may and was primarily having urinary frequency.  He states today that after UroLift he experiences urgency and nocturia if he ceases rapaflo. He states he believes that UroLift has had some improvement on his symptoms.   He states the during sexual intercourse he continues to have retrograde ejaculation.  No dysuria gross materia.  IPSS as below.   IPSS     Row Name 07/25/21 1400         International Prostate Symptom Score   How often have you had the sensation of not emptying your bladder? Less than 1 in 5     How often have you had to urinate less than every two hours? Less than 1 in 5 times     How often have you found you stopped and started again several times when you urinated? Not at All     How often have you found it difficult to postpone urination? Not at All     How often have you had a weak urinary stream? Not at All     How often have you had to strain to start urination? Not at All     How many times did you typically get up at night to urinate? 1 Time     Total IPSS Score 3           Quality of Life due to urinary symptoms   If you were to spend the rest of your life with your urinary condition just the way it is now how would you feel about that? Mixed              Score:  1-7 Mild 8-19 Moderate 20-35 Severe       PMH: Past Medical History:  Diagnosis Date   Allergic rhinitis    Anxiety    Aortic aneurysm and dissection ([place]) 07/2020   Arthritis     Diabetes mellitus without complication (HCC)    GERD (gastroesophageal reflux disease)    Headache    Heart murmur    History of kidney stones    HLD (hyperlipidemia)    Hypertension    Macular degeneration    OSA on CPAP    Paroxysmal atrial fibrillation ([place])    Psoriasis     Surgical History: Past Surgical History:  Procedure Laterality Date   ASCENDING AORTA GRAFT WITH CARDIOPULMONARY BYPASS; VALVE SUSPENSION WITH CORONARY RECONSTRUCTION AND VALVE-SPARING AORTIC ROOT REMODELING (DAVID PROCEDURE, YACOUB PROCEDURE)  08/11/2020   performed at [place] 07/13/2021   Procedure: COLONOSCOPY;  Surgeon: Annamaria Helling, DO;  Location: [facility];  Service: Gastroenterology;  Laterality: N/A;   CYSTOSCOPY WITH INSERTION OF UROLIFT N/A 03/13/2021   Procedure: CYSTOSCOPY WITH INSERTION OF UROLIFT;  Surgeon: Hollice Espy, MD;  Location: [facility] ORS;  Service: Urology;  Laterality: N/A;   EXTRACORPOREAL SHOCK WAVE LITHOTRIPSY Left 11/14/2017   Procedure: EXTRACORPOREAL SHOCK WAVE LITHOTRIPSY (ESWL);  Surgeon: Hollice Espy, MD;  [facility]  ORS;  Service: Urology;  Laterality: Left;   EYE SURGERY     NASAL SINUS SURGERY  1991   PENILE PROSTHESIS IMPLANT  02/2020   TONSILLECTOMY     VASECTOMY      Home Medications:  Allergies as of 07/25/2021       Reactions   Atorvastatin Other (See Comments)   Leg cramps    Pravastatin Other (See Comments)   Leg cramps    Fluvastatin    Other reaction(s): Muscle Pain   Rosuvastatin Other (See Comments)        Medication List        Accurate as of July 25, 2021  3:01 PM. If you have any questions, ask your nurse or doctor.          STOP taking these medications    clopidogrel 75 MG tablet Commonly known as: PLAVIX Stopped by: Hollice Espy, MD       TAKE these medications    acetaminophen 500 MG tablet Commonly known as: TYLENOL Take 1,000 mg by mouth every 6  (six) hours as needed for moderate pain.   acetic acid-hydrocortisone OTIC solution Commonly known as: VOSOL-HC Place 1 drop into both ears daily as needed (irritation).   amLODipine 10 MG tablet Commonly known as: NORVASC Take 10 mg by mouth daily.   blood glucose meter kit and supplies Kit Dispense based on patient and insurance preference. Use once daily as directed. (FOR ICD-10 E11.9).   cholecalciferol 1000 units tablet Commonly known as: VITAMIN D Take 1,000 Units by mouth daily.   COSENTYX [place] Inject 1 application into the skin every 30 (thirty) days.   ezetimibe 10 MG tablet Commonly known as: ZETIA Take 10 mg by mouth daily.   Fish Oil 1000 MG Caps Take 4,000 mg by mouth daily.   fluticasone 50 MCG/ACT nasal spray Commonly known as: FLONASE Place 2 sprays into both nostrils daily. What changed:  when to take this reasons to take this   folic acid 1 MG tablet Commonly known as: FOLVITE Take 1 mg by mouth daily.   HYDROcodone-acetaminophen 5-325 MG tablet Commonly known as: NORCO/VICODIN Take 1-2 tablets by mouth every 6 (six) hours as needed for moderate pain.   latanoprost 0.005 % ophthalmic solution Commonly known as: XALATAN Place 1 drop into both eyes at bedtime.   metFORMIN 500 MG tablet Commonly known as: GLUCOPHAGE Take 500 mg by mouth daily.   methotrexate 2.5 MG tablet Commonly known as: RHEUMATREX Take 12.5 mg by mouth every Tuesday.   metoprolol tartrate 50 MG tablet Commonly known as: LOPRESSOR Take 50 mg by mouth in the morning and at bedtime.   multivitamin capsule Take 1 capsule by mouth daily.   naproxen sodium 220 MG tablet Commonly known as: ALEVE Take 220 mg by mouth 2 (two) times daily as needed (pain).   omeprazole 20 MG capsule Commonly known as: PRILOSEC TAKE 1 CAPSULE DAILY What changed:  how much to take how to take this when to take this additional instructions   polyethylene glycol powder 17 GM/SCOOP  powder Commonly known as: GLYCOLAX/MIRALAX TAKE 17 G BY MOUTH DAILY AS NEEDED FOR MILD CONSTIPATION.   PRESERVISION AREDS 2 PO Take 1 capsule by mouth in the morning and at bedtime.   QC TUMERIC COMPLEX PO Take 2 capsules by mouth daily.   silodosin 8 MG Caps capsule Commonly known as: RAPAFLO Take 1 capsule (8 mg total) by mouth daily with breakfast.   SUPER B COMPLEX PO  Take 1 tablet by mouth daily.   zolpidem 5 MG tablet Commonly known as: AMBIEN Take 5 mg by mouth at bedtime as needed (sleep).        Allergies:  Allergies  Allergen Reactions   Atorvastatin Other (See Comments)    Leg cramps    Pravastatin Other (See Comments)    Leg cramps    Fluvastatin     Other reaction(s): Muscle Pain   Rosuvastatin Other (See Comments)    Family History: Family History  Problem Relation Age of Onset   Neuropathy Mother    Stroke Mother    Macular degeneration Mother    Diabetes Father    Arthritis Other        Parent   Hypertension Other        Parent   Diabetes Other        Parent   Heart attack Brother    Kidney cancer Brother    Bladder Cancer Neg Hx    Prostate cancer Neg Hx     Social History:  reports that he has never smoked. He has never used smokeless tobacco. He reports current alcohol use of about 2.0 standard drinks per week. He reports that he does not use drugs.   Physical Exam: BP 138/76   Pulse 66   Ht 6' (1.829 m)   Wt 255 lb (115.7 kg)   BMI 34.58 kg/m   Constitutional:  Alert and oriented, No acute distress. HEENT: [place] AT, moist mucus membranes.  Trachea midline, no masses. Cardiovascular: No clubbing, cyanosis, or edema. Respiratory: Normal respiratory effort, no increased work of breathing. Skin: No rashes, bruises or suspicious lesions. Neurologic: Grossly intact, no focal deficits, moving all 4 extremities. Psychiatric: Normal mood and affect.  Laboratory Data:  Lab Results  Component Value Date   CREATININE 0.79 08/11/2020     Lab Results  Component Value Date   HGBA1C 6.6 (H) 04/26/2020   Pertinent Imaging: Results for orders placed or performed in visit on 07/25/21  BLADDER SCAN AMB NON-IMAGING  Result Value Ref Range   Scan Result 129 ml      Assessment & Plan:    Benign prostatic hyperplasia with urinary hesitancy  - IPSS is 3 today which seems to be overall improved but still requires Rapaflo -We discussed either continuation of this medication or perhaps even cutting back to every other day as an alternative.  He may experiment with this little bit more but will likely need to continue this due to his waxing waning intermittent urinary symptoms - PVR is mildly elevated at 129 mL  - s/p UroLift and has seen mild improvement    Return in 1 year for follow-up for IPSS/PVR/DRE/PSA  Gevena Mart Littlejohn,acting as a scribe for Hollice Espy, MD.,have documented all relevant documentation on the behalf of Hollice Espy, MD,as directed by  Hollice Espy, MD while in the presence of Hollice Espy, MD.  I have reviewed the above documentation for accuracy and completeness, and I agree with the above.   Hollice Espy, MD   [facility] [address], [place], [place] [address] [phone]

## 2021-07-25 ENCOUNTER — Ambulatory Visit (INDEPENDENT_AMBULATORY_CARE_PROVIDER_SITE_OTHER): Payer: Medicare Other | Admitting: Urology

## 2021-07-25 ENCOUNTER — Other Ambulatory Visit: Payer: Self-pay

## 2021-07-25 VITALS — BP 138/76 | HR 66 | Ht 72.0 in | Wt 255.0 lb

## 2021-07-25 DIAGNOSIS — R3911 Hesitancy of micturition: Secondary | ICD-10-CM

## 2021-07-25 DIAGNOSIS — N401 Enlarged prostate with lower urinary tract symptoms: Secondary | ICD-10-CM | POA: Diagnosis not present

## 2021-07-25 LAB — BLADDER SCAN AMB NON-IMAGING: Scan Result: 129

## 2021-07-27 DIAGNOSIS — I480 Paroxysmal atrial fibrillation: Secondary | ICD-10-CM | POA: Diagnosis not present

## 2021-08-04 DIAGNOSIS — I480 Paroxysmal atrial fibrillation: Secondary | ICD-10-CM | POA: Diagnosis not present

## 2021-08-04 DIAGNOSIS — G459 Transient cerebral ischemic attack, unspecified: Secondary | ICD-10-CM | POA: Diagnosis not present

## 2021-08-04 DIAGNOSIS — G4733 Obstructive sleep apnea (adult) (pediatric): Secondary | ICD-10-CM | POA: Diagnosis not present

## 2021-08-04 DIAGNOSIS — E782 Mixed hyperlipidemia: Secondary | ICD-10-CM | POA: Diagnosis not present

## 2021-08-04 DIAGNOSIS — E119 Type 2 diabetes mellitus without complications: Secondary | ICD-10-CM | POA: Diagnosis not present

## 2021-08-07 ENCOUNTER — Emergency Department: Payer: Medicare Other

## 2021-08-07 ENCOUNTER — Other Ambulatory Visit: Payer: Self-pay

## 2021-08-07 ENCOUNTER — Observation Stay
Admission: EM | Admit: 2021-08-07 | Discharge: 2021-08-08 | Disposition: A | Discharge status: Home / Self Care | Payer: Medicare Other | Attending: Internal Medicine | Admitting: Internal Medicine

## 2021-08-07 ENCOUNTER — Observation Stay: Payer: Medicare Other

## 2021-08-07 DIAGNOSIS — I480 Paroxysmal atrial fibrillation: Secondary | ICD-10-CM

## 2021-08-07 DIAGNOSIS — I10 Essential (primary) hypertension: Secondary | ICD-10-CM | POA: Diagnosis present

## 2021-08-07 DIAGNOSIS — G4733 Obstructive sleep apnea (adult) (pediatric): Secondary | ICD-10-CM | POA: Diagnosis present

## 2021-08-07 DIAGNOSIS — Y900 Blood alcohol level of less than 20 mg/100 ml: Secondary | ICD-10-CM | POA: Insufficient documentation

## 2021-08-07 DIAGNOSIS — N400 Enlarged prostate without lower urinary tract symptoms: Secondary | ICD-10-CM | POA: Diagnosis not present

## 2021-08-07 DIAGNOSIS — G454 Transient global amnesia: Principal | ICD-10-CM | POA: Insufficient documentation

## 2021-08-07 DIAGNOSIS — Z8679 Personal history of other diseases of the circulatory system: Secondary | ICD-10-CM | POA: Diagnosis not present

## 2021-08-07 DIAGNOSIS — H532 Diplopia: Secondary | ICD-10-CM | POA: Diagnosis present

## 2021-08-07 DIAGNOSIS — R413 Other amnesia: Secondary | ICD-10-CM

## 2021-08-07 DIAGNOSIS — Z7901 Long term (current) use of anticoagulants: Secondary | ICD-10-CM | POA: Insufficient documentation

## 2021-08-07 DIAGNOSIS — R410 Disorientation, unspecified: Secondary | ICD-10-CM | POA: Diagnosis not present

## 2021-08-07 DIAGNOSIS — Z7984 Long term (current) use of oral hypoglycemic drugs: Secondary | ICD-10-CM | POA: Insufficient documentation

## 2021-08-07 DIAGNOSIS — E669 Obesity, unspecified: Secondary | ICD-10-CM | POA: Diagnosis not present

## 2021-08-07 DIAGNOSIS — Z20822 Contact with and (suspected) exposure to covid-19: Secondary | ICD-10-CM | POA: Insufficient documentation

## 2021-08-07 DIAGNOSIS — Z79899 Other long term (current) drug therapy: Secondary | ICD-10-CM | POA: Insufficient documentation

## 2021-08-07 DIAGNOSIS — R29898 Other symptoms and signs involving the musculoskeletal system: Secondary | ICD-10-CM | POA: Diagnosis not present

## 2021-08-07 DIAGNOSIS — R4182 Altered mental status, unspecified: Secondary | ICD-10-CM | POA: Diagnosis not present

## 2021-08-07 DIAGNOSIS — E785 Hyperlipidemia, unspecified: Secondary | ICD-10-CM | POA: Diagnosis present

## 2021-08-07 DIAGNOSIS — L4050 Arthropathic psoriasis, unspecified: Secondary | ICD-10-CM | POA: Diagnosis not present

## 2021-08-07 DIAGNOSIS — E119 Type 2 diabetes mellitus without complications: Secondary | ICD-10-CM | POA: Diagnosis not present

## 2021-08-07 DIAGNOSIS — Z8673 Personal history of transient ischemic attack (TIA), and cerebral infarction without residual deficits: Secondary | ICD-10-CM | POA: Insufficient documentation

## 2021-08-07 DIAGNOSIS — F04 Amnestic disorder due to known physiological condition: Secondary | ICD-10-CM | POA: Diagnosis not present

## 2021-08-07 LAB — CBC
HCT: 48 % (ref 39.0–52.0)
HCT: 45.4 % (ref 39.0–52.0)
Hemoglobin: 17.3 g/dL — ABNORMAL HIGH (ref 13.0–17.0)
Hemoglobin: 16.5 g/dL (ref 13.0–17.0)
MCH: 32.3 pg (ref 26.0–34.0)
MCH: 32.9 pg (ref 26.0–34.0)
MCHC: 36 g/dL (ref 30.0–36.0)
MCHC: 36.3 g/dL — ABNORMAL HIGH (ref 30.0–36.0)
MCV: 89.6 fL (ref 80.0–100.0)
MCV: 90.6 fL (ref 80.0–100.0)
Platelets: 197 10*3/uL (ref 150–400)
Platelets: 169 10*3/uL (ref 150–400)
RBC: 5.36 MIL/uL (ref 4.22–5.81)
RBC: 5.01 MIL/uL (ref 4.22–5.81)
RDW: 12.7 % (ref 11.5–15.5)
RDW: 12.8 % (ref 11.5–15.5)
WBC: 8.7 10*3/uL (ref 4.0–10.5)
WBC: 9.5 10*3/uL (ref 4.0–10.5)
nRBC: 0 % (ref 0.0–0.2)
nRBC: 0 % (ref 0.0–0.2)

## 2021-08-07 LAB — COMPREHENSIVE METABOLIC PANEL
ALT: 20 U/L (ref 0–44)
AST: 24 U/L (ref 15–41)
Albumin: 4.2 g/dL (ref 3.5–5.0)
Alkaline Phosphatase: 70 U/L (ref 38–126)
Anion gap: 12 (ref 5–15)
BUN: 19 mg/dL (ref 8–23)
CO2: 23 mmol/L (ref 22–32)
Calcium: 9.7 mg/dL (ref 8.9–10.3)
Chloride: 100 mmol/L (ref 98–111)
Creatinine, Ser: 0.98 mg/dL (ref 0.61–1.24)
GFR, Estimated: >60 mL/min (ref >60)
Glucose, Bld: 121 mg/dL — ABNORMAL HIGH (ref 70–99)
Potassium: 4.1 mmol/L (ref 3.5–5.1)
Sodium: 135 mmol/L (ref 135–145)
Total Bilirubin: 1 mg/dL (ref 0.3–1.2)
Total Protein: 7.6 g/dL (ref 6.5–8.1)

## 2021-08-07 LAB — DIFFERENTIAL
Abs Immature Granulocytes: 0.04 10*3/uL (ref 0.00–0.07)
Abs Immature Granulocytes: 0.05 10*3/uL (ref 0.00–0.07)
Basophils Absolute: 0.1 10*3/uL (ref 0.0–0.1)
Basophils Absolute: 0.1 10*3/uL (ref 0.0–0.1)
Basophils Relative: 1 %
Basophils Relative: 1 %
Eosinophils Absolute: 0.1 10*3/uL (ref 0.0–0.5)
Eosinophils Absolute: 0.2 10*3/uL (ref 0.0–0.5)
Eosinophils Relative: 2 %
Eosinophils Relative: 2 %
Immature Granulocytes: 1 %
Immature Granulocytes: 1 %
Lymphocytes Relative: 29 %
Lymphocytes Relative: 28 %
Lymphs Abs: 2.5 10*3/uL (ref 0.7–4.0)
Lymphs Abs: 2.6 10*3/uL (ref 0.7–4.0)
Monocytes Absolute: 0.8 10*3/uL (ref 0.1–1.0)
Monocytes Absolute: 0.8 10*3/uL (ref 0.1–1.0)
Monocytes Relative: 9 %
Monocytes Relative: 8 %
Neutro Abs: 5.1 10*3/uL (ref 1.7–7.7)
Neutro Abs: 5.7 10*3/uL (ref 1.7–7.7)
Neutrophils Relative %: 58 %
Neutrophils Relative %: 60 %

## 2021-08-07 LAB — PROTIME-INR
INR: 1.3 — ABNORMAL HIGH (ref 0.8–1.2)
Prothrombin Time: 16.3 seconds — ABNORMAL HIGH (ref 11.4–15.2)

## 2021-08-07 LAB — URINALYSIS, ROUTINE W REFLEX MICROSCOPIC
Bilirubin Urine: NEGATIVE
Glucose, UA: NEGATIVE mg/dL
Hgb urine dipstick: NEGATIVE
Ketones, ur: NEGATIVE mg/dL
Leukocytes,Ua: NEGATIVE
Nitrite: NEGATIVE
Protein, ur: NEGATIVE mg/dL
Specific Gravity, Urine: 1.023 (ref 1.005–1.030)
pH: 9 — ABNORMAL HIGH (ref 5.0–8.0)

## 2021-08-07 LAB — URINE DRUG SCREEN, QUALITATIVE (ARMC ONLY)
Amphetamines, Ur Screen: NOT DETECTED
Barbiturates, Ur Screen: NOT DETECTED
Benzodiazepine, Ur Scrn: NOT DETECTED
Cannabinoid 50 Ng, Ur ~~LOC~~: NOT DETECTED
Cocaine Metabolite,Ur ~~LOC~~: NOT DETECTED
MDMA (Ecstasy)Ur Screen: NOT DETECTED
Methadone Scn, Ur: NOT DETECTED
Opiate, Ur Screen: NOT DETECTED
Phencyclidine (PCP) Ur S: NOT DETECTED
Tricyclic, Ur Screen: NOT DETECTED

## 2021-08-07 LAB — RESP PANEL BY RT-PCR (FLU A&B, COVID) ARPGX2
Influenza A by PCR: NEGATIVE
Influenza B by PCR: NEGATIVE
SARS Coronavirus 2 by RT PCR: NEGATIVE

## 2021-08-07 LAB — APTT: aPTT: 32 seconds (ref 24–36)

## 2021-08-07 LAB — ETHANOL: Alcohol, Ethyl (B): <10 mg/dL (ref <10)

## 2021-08-07 IMAGING — MR MR HEAD W/O CM
11 series · 48 of 48 positions shown · non-contrast
Comparison: None.

CLINICAL DATA: Transient ischemic attack.  Right eye diplopia.

EXAM:
MRI HEAD WITHOUT CONTRAST
TECHNIQUE: Multiplanar, multiecho pulse sequences of the brain and surrounding
structures were obtained without intravenous contrast.

[Series 5: ax dwi_tracew · axial · 3.0mm · 0.65mm/px · z∈[-59,+102]mm · 4 of 50 slices shown]
[im 1/50]
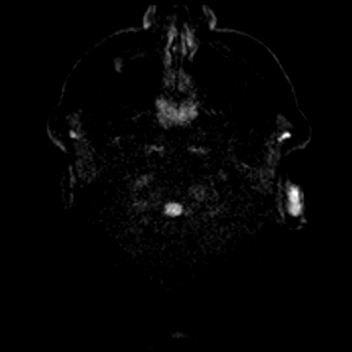
[im 17/50]
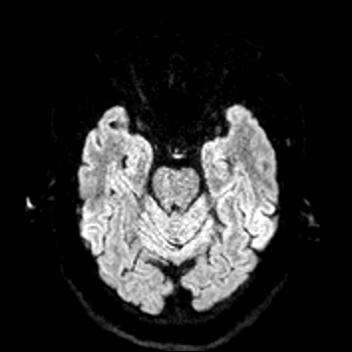
[im 33/50]
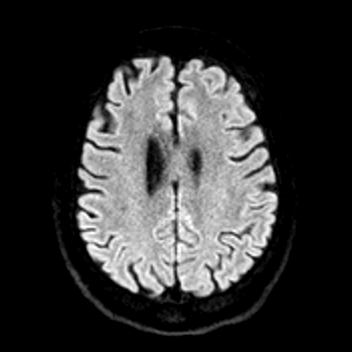
[im 50/50]
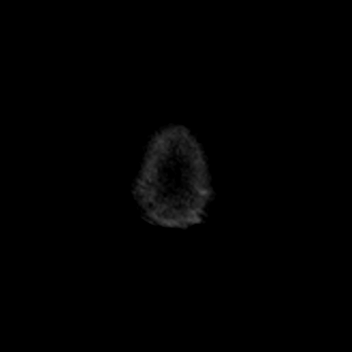

[Series 6: ax dwi_adc · axial · 3.0mm · 0.65mm/px · z∈[-59,+102]mm · 4 of 50 slices shown]
[im 1/50]
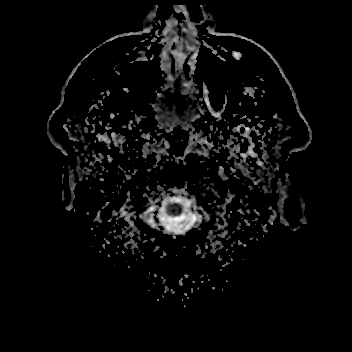
[im 17/50]
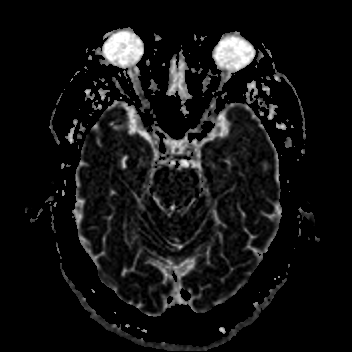
[im 33/50]
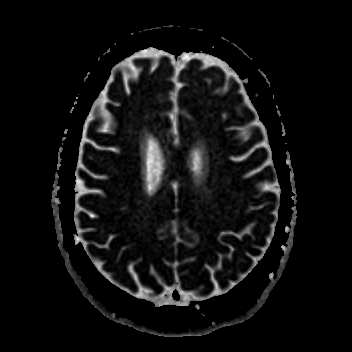
[im 50/50]
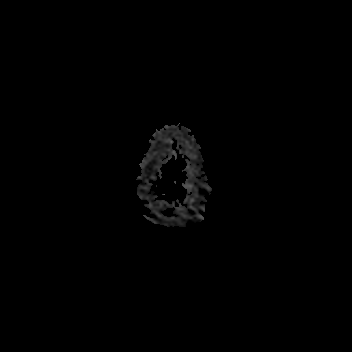

[Series 7: cor dwi_tracew · coronal · 5.0mm · 0.65mm/px · 3 of 42 slices shown]
[im 1/42]
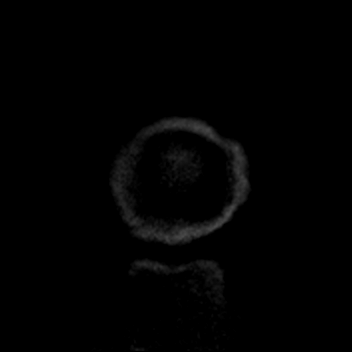
[im 21/42]
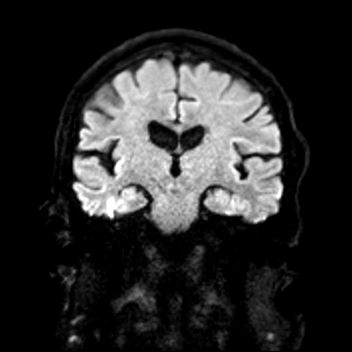
[im 42/42]
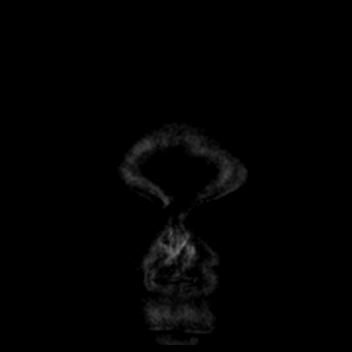

[Series 8: cor dwi_adc · coronal · 5.0mm · 0.65mm/px · 3 of 42 slices shown]
[im 1/42]
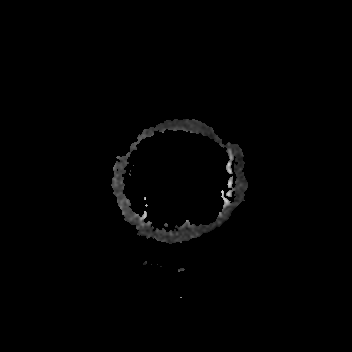
[im 21/42]
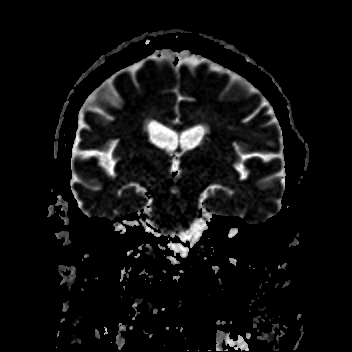
[im 42/42]
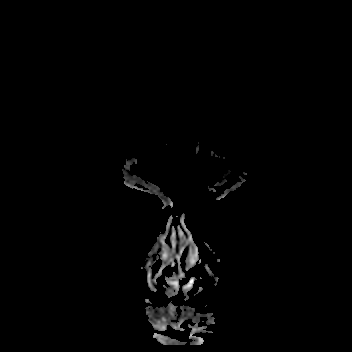

[Series 9: T1 · sagittal · 5.0mm · 0.62mm/px · 2 of 24 slices shown (1 of 2)]
[im 1/24]
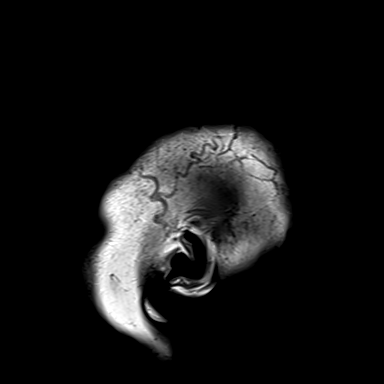
[im 24/24]
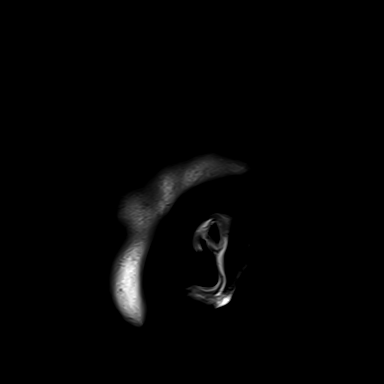

[Series 10: T2 · axial · 5.0mm · 0.55mm/px · z∈[-60,+102]mm · 2 of 28 slices shown (1 of 2)]
[im 1/28]
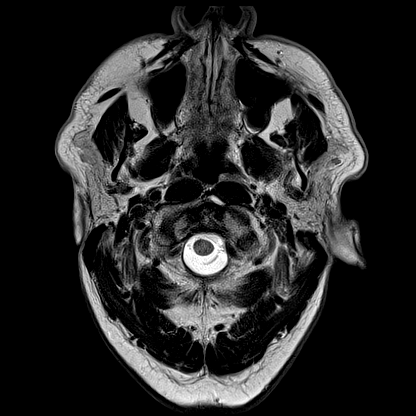
[im 28/28]
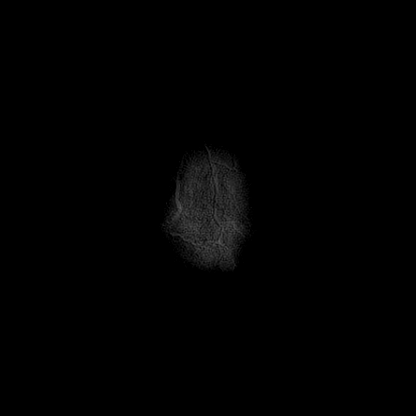

[Series 12: pha_images · axial · 3.0mm · 0.90mm/px · z∈[-67,+110]mm · 5 of 59 slices shown]
[im 1/59]
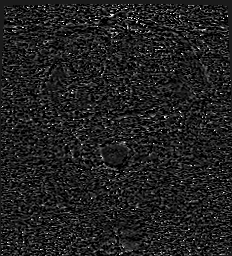
[im 15/59]
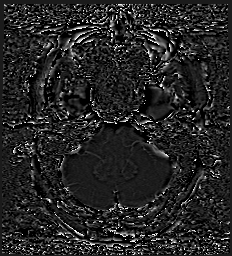
[im 30/59]
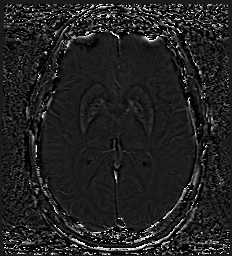
[im 44/59]
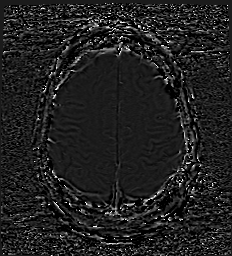
[im 59/59]
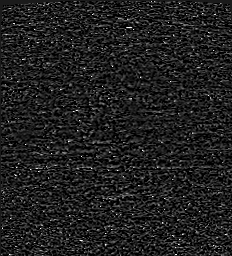

[Series 13: swi_images · axial · 3.0mm · 0.90mm/px · z∈[-67,+110]mm · 5 of 60 slices shown]
[im 1/60]
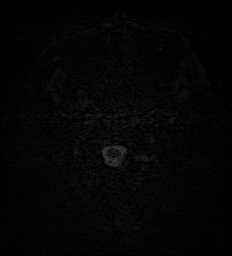
[im 15/60]
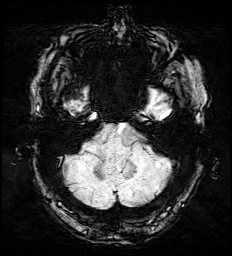
[im 30/60]
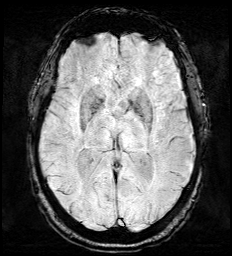
[im 45/60]
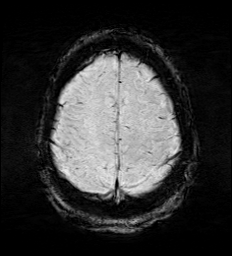
[im 60/60]
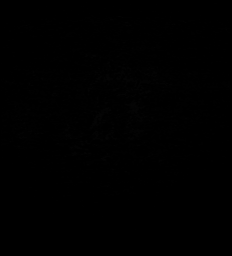

[Series 15: FLAIR · axial · 3.0mm · 0.53mm/px · z∈[-60,+102]mm · 4 of 55 slices shown]
[im 1/55]
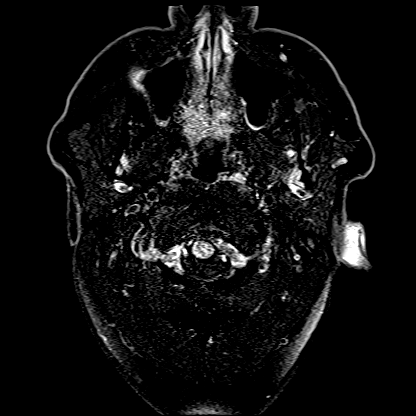
[im 19/55]
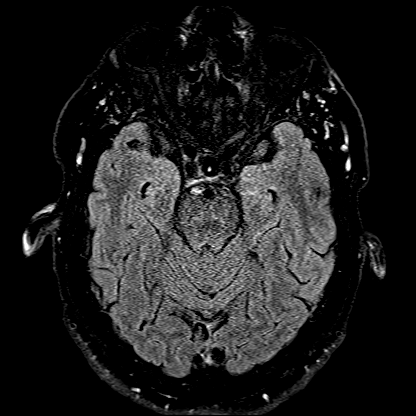
[im 37/55]
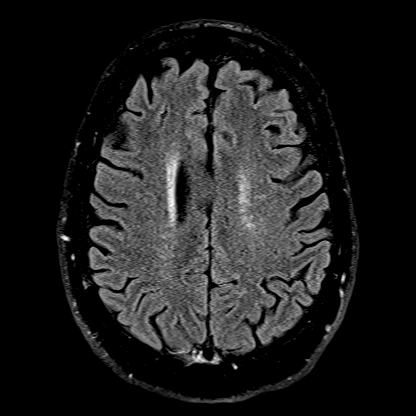
[im 55/55]
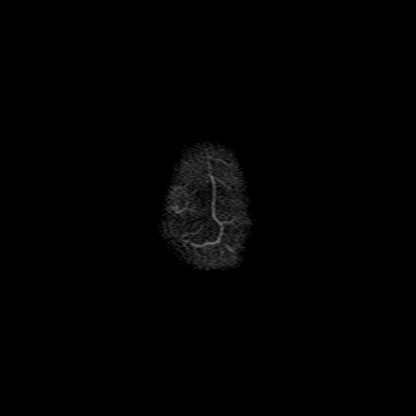

[Series 16: T1 · axial · 1.0mm · 0.98mm/px · z∈[-66,+109]mm · 14 of 176 slices shown (2 of 2)]
[im 1/176]
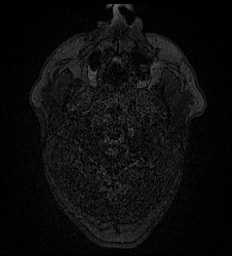
[im 14/176]
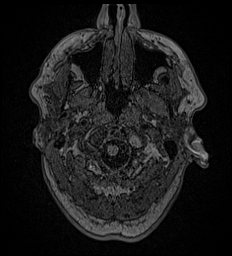
[im 27/176]
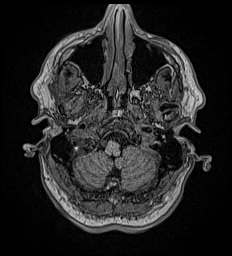
[im 41/176]
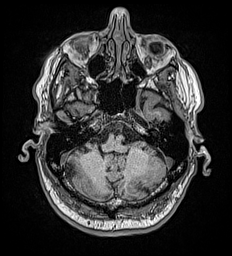
[im 54/176]
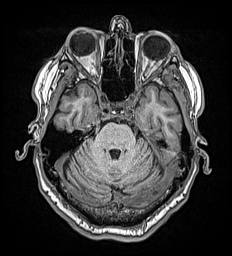
[im 68/176]
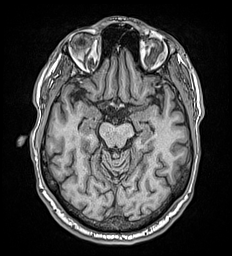
[im 81/176]
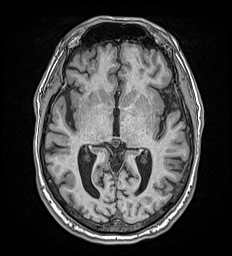
[im 95/176]
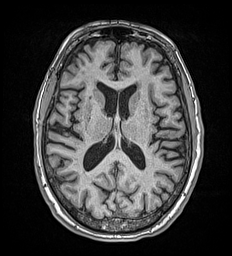
[im 108/176]
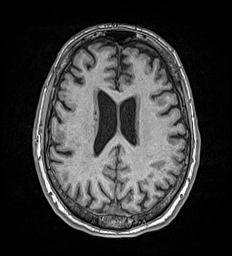
[im 122/176]
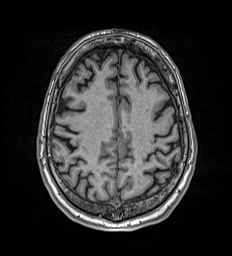
[im 135/176]
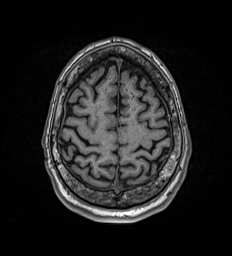
[im 149/176]
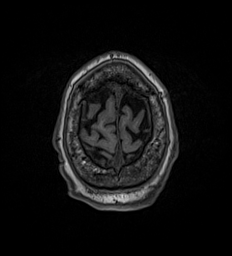
[im 162/176]
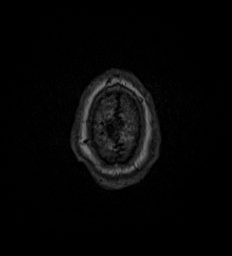
[im 176/176]
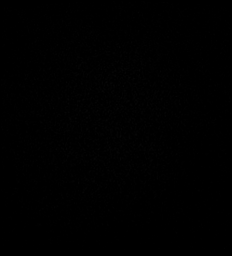

[Series 17: T2 · coronal · 5.0mm · 0.57mm/px · 2 of 32 slices shown (2 of 2)]
[im 1/32]
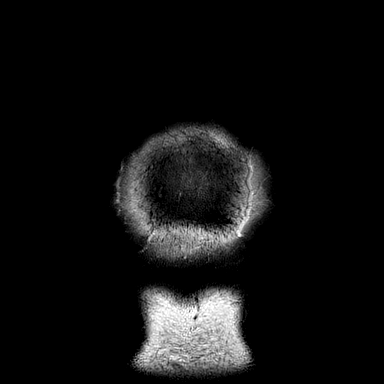
[im 32/32]
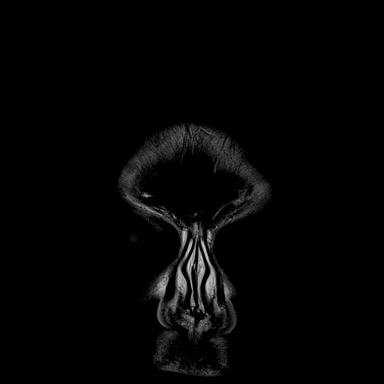

[48 of 48 positions shown; findings below may reference images not displayed]

FINDINGS: Brain: No acute infarct, mass effect or extra-axial collection.
Fewer than 5 scattered microhemorrhages in a nonspecific pattern.
There is multifocal hyperintense T2-weighted signal within the white
matter. Generalized volume loss without a clear lobar predilection.
The midline structures are normal.

Vascular: Major flow voids are preserved.

Skull and upper cervical spine: Normal calvarium and skull base.
Visualized upper cervical spine and soft tissues are normal.

Sinuses/Orbits:No paranasal sinus fluid levels or advanced mucosal
thickening. No mastoid or middle ear effusion. Normal orbits.
IMPRESSION: 1. No acute intracranial abnormality.
2. Findings of mild chronic small vessel ischemic disease and volume
loss.

## 2021-08-07 IMAGING — CT CT HEAD W/O CM
3 series · 14 of 47 positions shown, 16 images · non-contrast
Comparison: None.

CLINICAL DATA: Delirium, double vision

EXAM:
CT HEAD WITHOUT CONTRAST
TECHNIQUE: Contiguous axial images were obtained from the base of the skull
through the vertex without intravenous contrast.

[Series 2: head wo · axial · 0.45mm/px · z∈[+155,+295]mm · 8 of 34 slices shown, 10 images]
[im 3/34  brain]
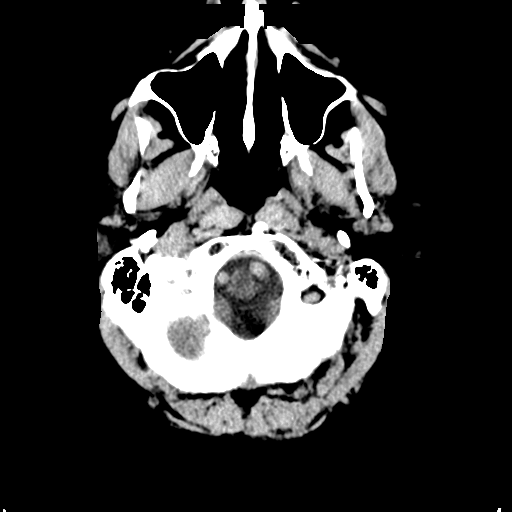
[im 3/34  bone]
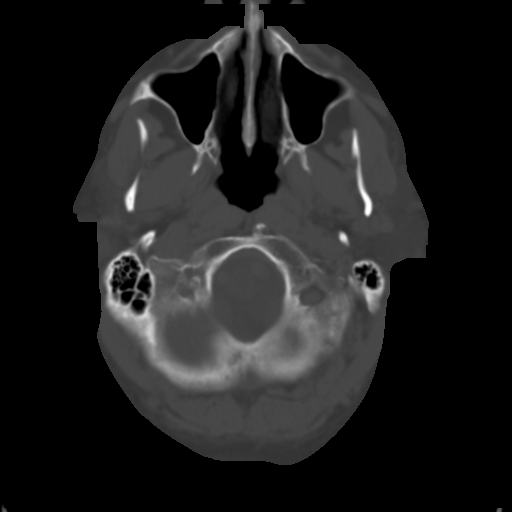
[im 7/34  brain]
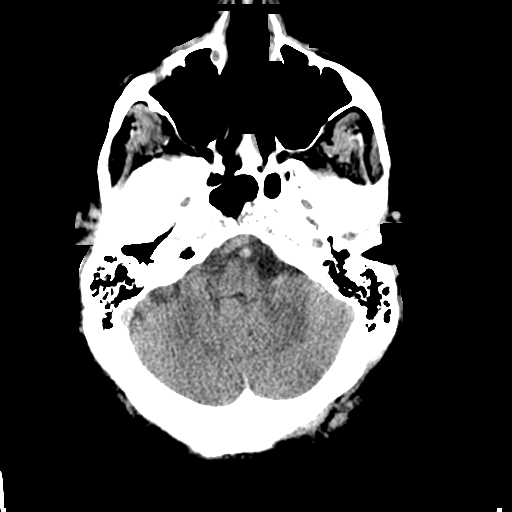
[im 11/34  brain]
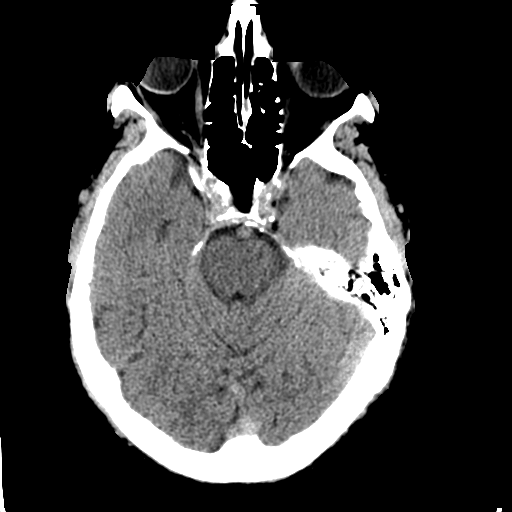
[im 15/34  brain]
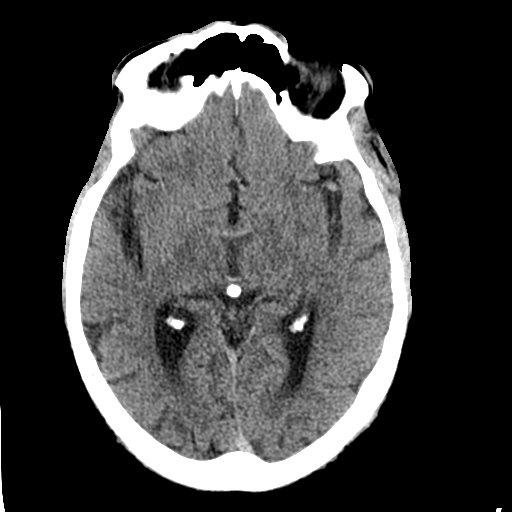
[im 19/34  brain]
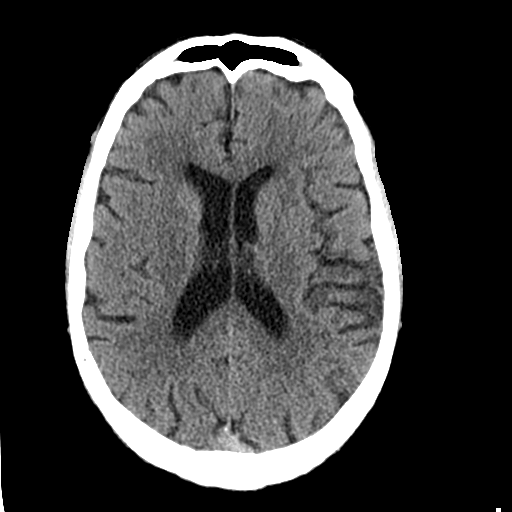
[im 19/34  bone]
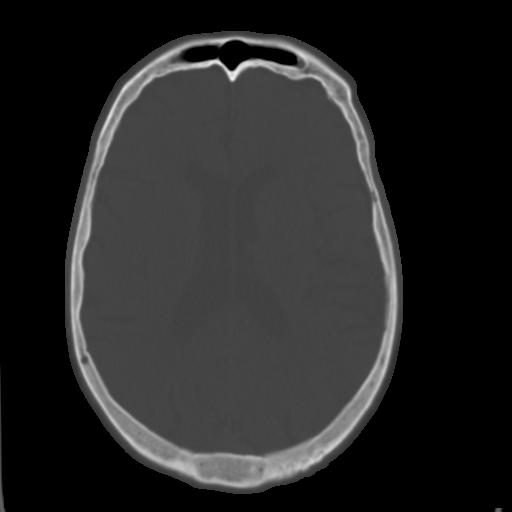
[im 23/34  brain]
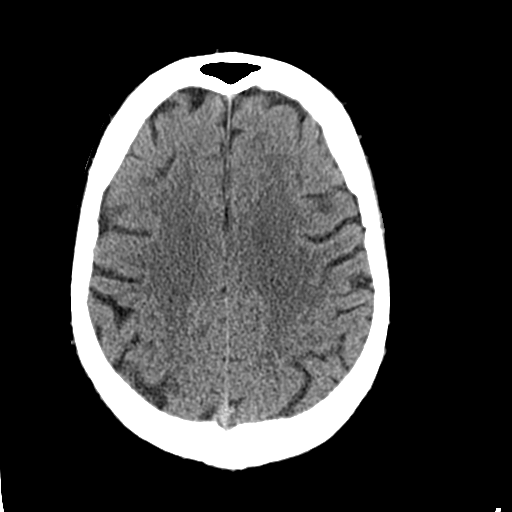
[im 27/34  brain]
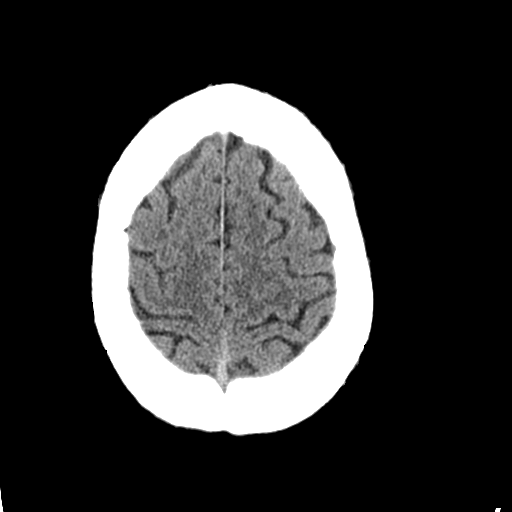
[im 31/34  brain]
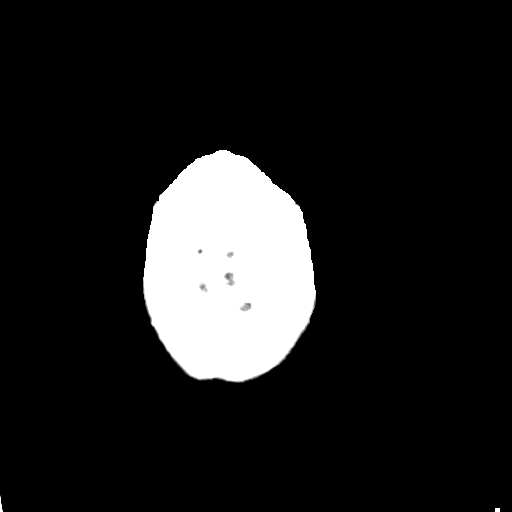

[Series 4: coronal soft tissue · coronal · 0.33mm/px · 3 of 82 slices shown]
[im 32/82  brain]
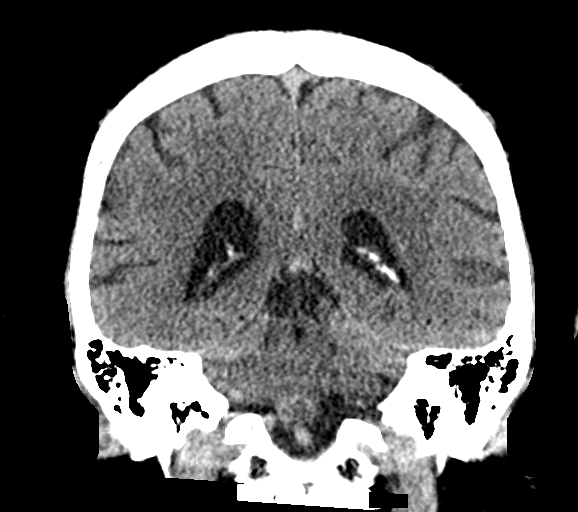
[im 38/82  brain]
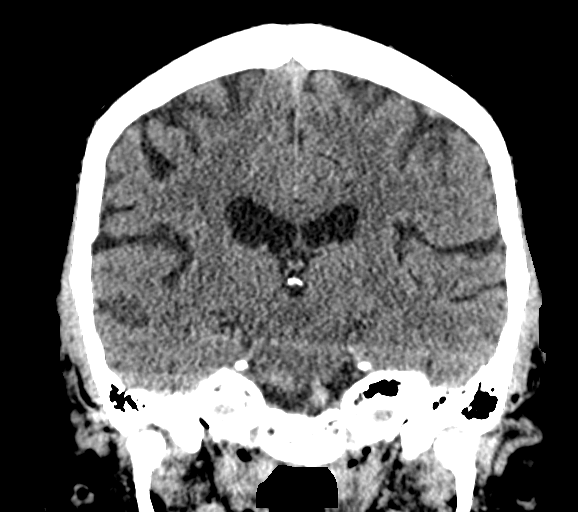
[im 44/82  brain]
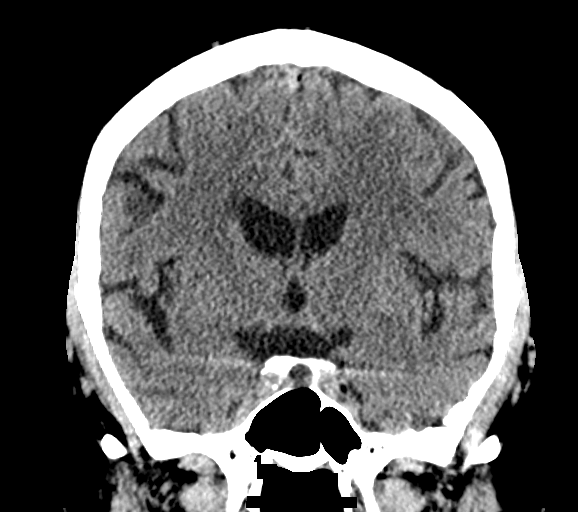

[Series 5: sagittal soft tissue · sagittal · 0.33mm/px · 3 of 65 slices shown]
[im 22/65  brain]
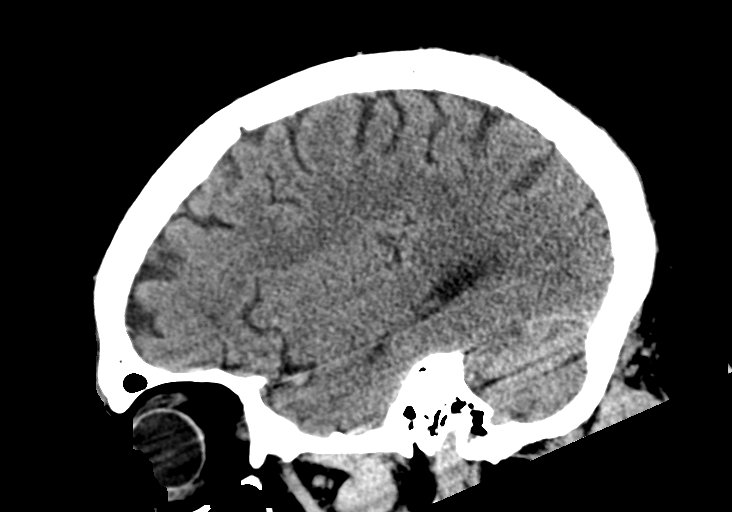
[im 33/65  brain]
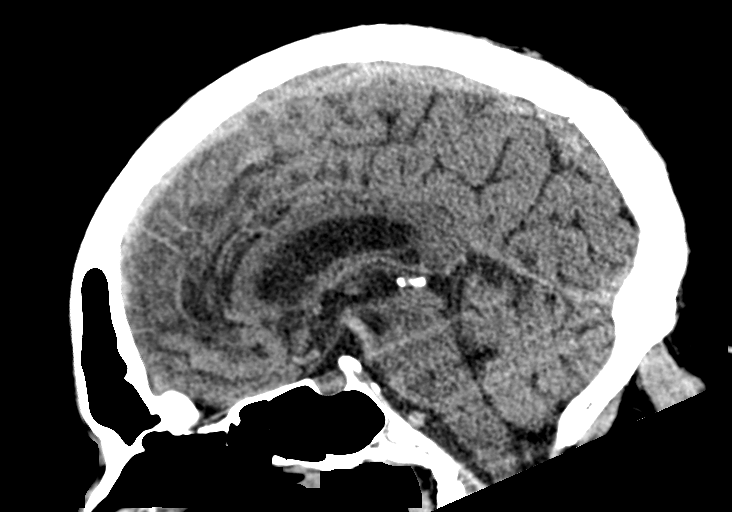
[im 43/65  brain]
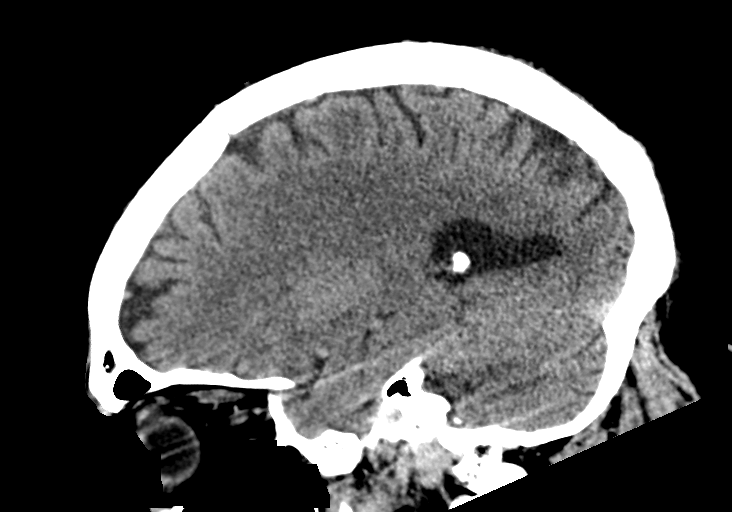

[14 of 47 positions shown; findings below may reference images not displayed]

FINDINGS: Brain: Normal anatomic configuration. Parenchymal volume loss is
commensurate with the patient's age. Mild periventricular white
matter changes are present likely reflecting the sequela of small
vessel ischemia. No abnormal intra or extra-axial mass lesion or
fluid collection. No abnormal mass effect or midline shift. No
evidence of acute intracranial hemorrhage or infarct. Ventricular
size is normal. Cerebellum unremarkable.

Vascular: There is asymmetric hyperdensity involving the left M2 and
M3 branches within the left sylvian fissure, best seen on axial
image # 15-17/2. Acute intraluminal thrombus is not excluded.

Skull: Intact

Sinuses/Orbits: Paranasal sinuses are clear. Orbits are
unremarkable.

Other: Mastoid air cells and middle ear cavities are clear.
IMPRESSION: Asymmetric hyperdensity involving the peripheral left MCA branches.
Acute intraluminal thrombus is not excluded and correlation with
clinical examination is recommended. If abnormal, CT arteriography
would be helpful for further evaluation. No evidence of acute
cortical infarct at this time.

Attempts are being made to contact the patient is managing clinician
for direct verbal communication of these findings at this time

## 2021-08-07 IMAGING — CT CT ANGIO HEAD-NECK (W OR W/O PERF)
2 of 10 series · 7 of 34 positions shown · IV contrast (APPLIED)
Comparison: Same-day noncontrast CT head

CLINICAL DATA: Altered mental status, double vision in right eye

EXAM:
CT ANGIOGRAPHY HEAD AND NECK
TECHNIQUE: Multidetector CT imaging of the head and neck was performed using
the standard protocol during bolus administration of intravenous
contrast. Multiplanar CT image reconstructions and MIPs were
obtained to evaluate the vascular anatomy. Carotid stenosis
measurements (when applicable) are obtained utilizing NASCET
criteria, using the distal internal carotid diameter as the
denominator.
CONTRAST:  75mL OMNIPAQUE IOHEXOL 350 MG/ML SOLN

[Series 8: sagittal soft tissue · sagittal · 0.31mm/px · 1 of 64 slices shown]
[im 5/64  soft-tissue]
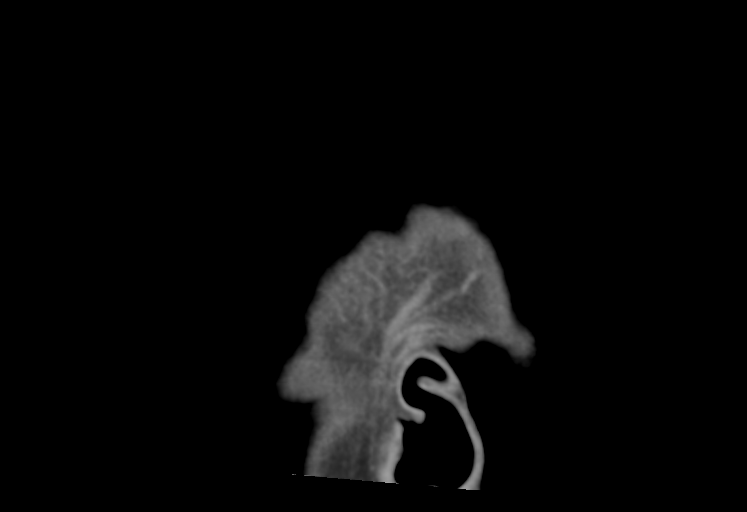

[Series 11: ax thin · axial · 0.59mm/px · z∈[+229,+491]mm · 6 of 368 slices shown]
[im 53/368  soft-tissue]
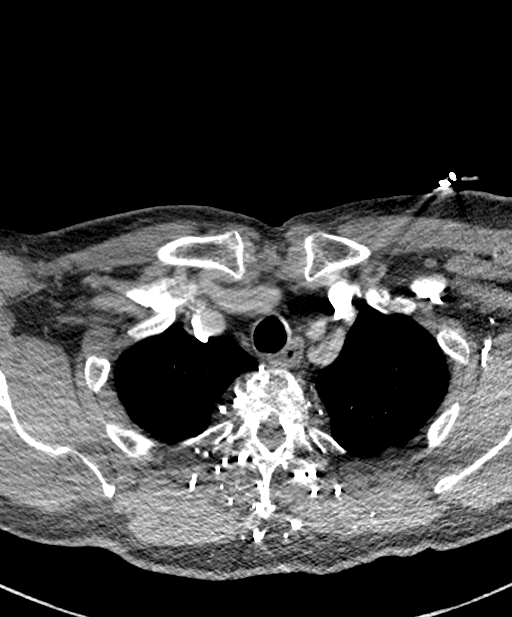
[im 105/368  bone]
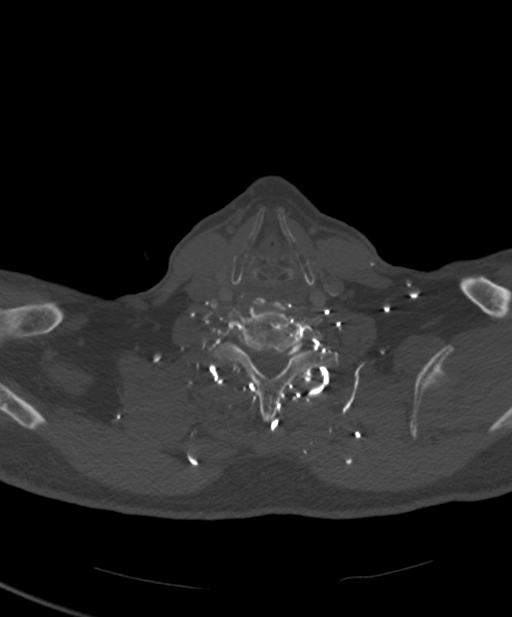
[im 158/368  soft-tissue]
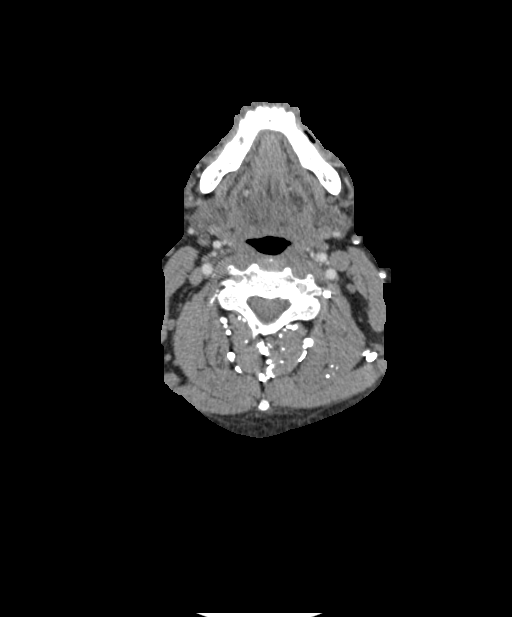
[im 210/368  bone]
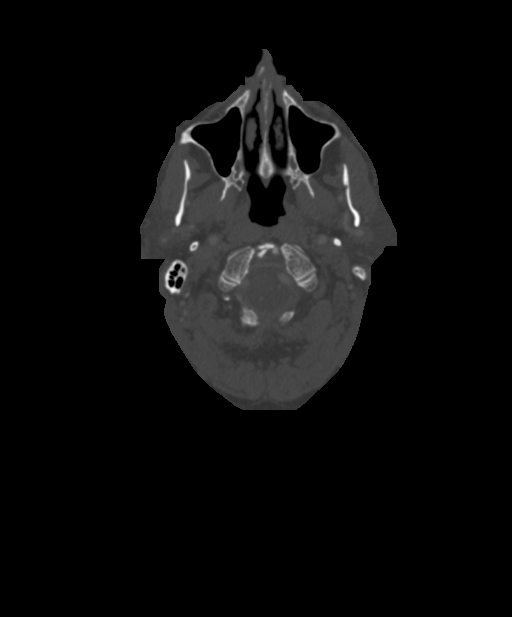
[im 263/368  soft-tissue]
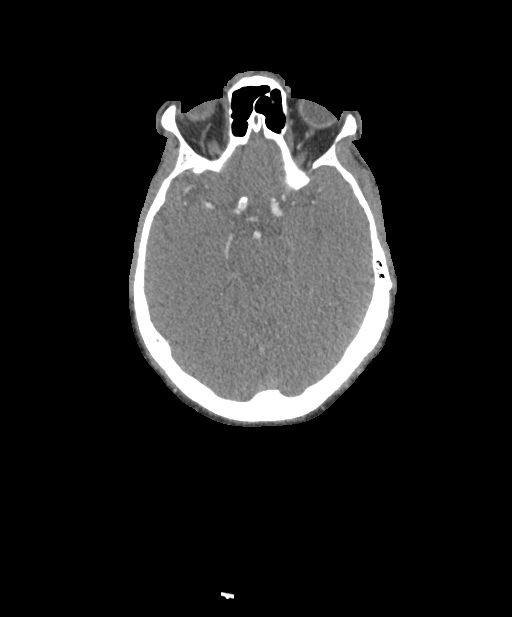
[im 315/368  bone]
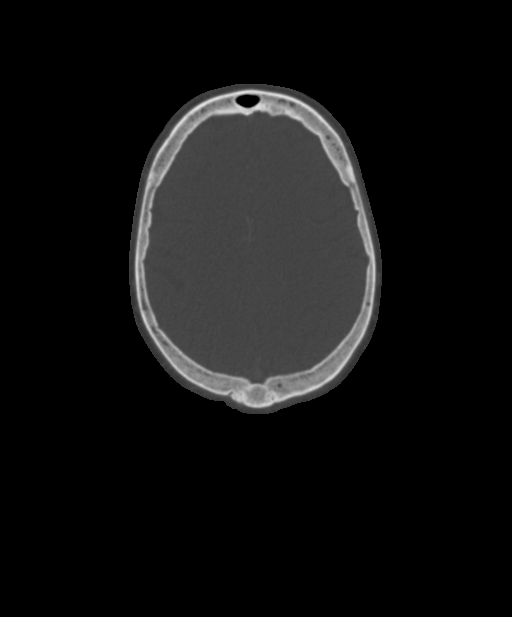

[7 of 34 positions shown; findings below may reference images not displayed]

FINDINGS: CT HEAD FINDINGS

Brain: There is no evidence of acute intracranial hemorrhage,
extra-axial fluid collection, or acute infarct. The ventricles are
stable in size. There is no midline shift. There is no mass lesion.

Vascular: See below

Skull: Normal. Negative for fracture or focal lesion.

Sinuses: The imaged paranasal sinuses are clear.

Orbits: Bilateral lens implants are in place. The globes and orbits
are otherwise unremarkable.

Review of the MIP images confirms the above findings

CTA NECK FINDINGS

The vasculature is suboptimally assessed due to poor timing of the
contrast bolus probably related to the aortic dissection.
Additionally, there is significant venous reflux resulting in streak
artifact throughout the neck.

Aortic arch: There has been interval repair of the previously seen
thoracic aortic aneurysm and dissection. Linear filling defects in
the aorta may reflect dissection flaps or surgical material. This is
incompletely evaluated on the current study.

Right carotid system: No evidence of dissection, stenosis (50% or
greater) or occlusion.

Left carotid system: No evidence of dissection, stenosis (50% or
greater) or occlusion.

Vertebral arteries: The proximal vertebral arteries are not assessed
due to significant venous contamination. The distal V2 and V3
segments are patent. There is no definite stenosis, dissection, or
occlusion.

Skeleton: There is mild multilevel degenerative change of the
cervical spine. There is degenerative change of both
temporomandibular joints.

Other neck: The soft tissues are unremarkable.

Upper chest: The lung apices are clear.

Review of the MIP images confirms the above findings

CTA HEAD FINDINGS

Anterior circulation: There is calcification of the bilateral
intracranial ICAs without hemodynamically significant stenosis or
occlusion. The bilateral MCAs and ACAs are patent. There is no
aneurysm.

Posterior circulation: The V4 segments of the vertebral arteries are
patent. The basilar artery is patent. The bilateral PCAs are patent.
There is no significant stenosis or occlusion. There is no aneurysm.

Venous sinuses: Not well assessed.

Anatomic variants: None.

Review of the MIP images confirms the above findings
IMPRESSION: Suboptimal study due to poor contrast timing and significant venous
contamination in the neck.

1. The proximal vertebral arteries are not assessed due to
significant venous contamination. The remaining vasculature of the
head and neck is patent, with no significant stenosis, dissection,
occlusion, or aneurysm.
2. No acute intracranial hemorrhage or infarct.
3. Interval repair of a thoracic aortic aneurysm and dissection.
Linear filling defects in the thoracic aorta may reflect dissection
flaps or surgical material. This is incompletely evaluated on the
current study. Consider dedicated CTA of the chest for better
evaluation if indicated.

## 2021-08-07 MED ORDER — METHOTREXATE 2.5 MG PO TABS
12.5000 mg | ORAL_TABLET | ORAL | Status: DC
  Administered 2021-08-08: 12.5 mg via ORAL
  Filled 2021-08-07: qty 5

## 2021-08-07 MED ORDER — SENNOSIDES-DOCUSATE SODIUM 8.6-50 MG PO TABS: 1.0000 | ORAL_TABLET | Freq: Every evening | ORAL | Status: DC | PRN

## 2021-08-07 MED ORDER — ACETAMINOPHEN 650 MG RE SUPP: 650.0000 mg | RECTAL | Status: DC | PRN

## 2021-08-07 MED ORDER — OMEGA-3-ACID ETHYL ESTERS 1 G PO CAPS
1.0000 g | ORAL_CAPSULE | Freq: Every day | ORAL | Status: DC
  Administered 2021-08-08: 1 g via ORAL
  Filled 2021-08-07 (×2): qty 1

## 2021-08-07 MED ORDER — ACETAMINOPHEN 160 MG/5ML PO SOLN
650.0000 mg | ORAL | Status: DC | PRN
  Filled 2021-08-07: qty 20.3

## 2021-08-07 MED ORDER — FOLIC ACID 1 MG PO TABS
1.0000 mg | ORAL_TABLET | Freq: Every day | ORAL | Status: DC
  Administered 2021-08-08: 1 mg via ORAL
  Filled 2021-08-07: qty 1

## 2021-08-07 MED ORDER — TAMSULOSIN HCL 0.4 MG PO CAPS
0.4000 mg | ORAL_CAPSULE | Freq: Every day | ORAL | Status: DC
  Administered 2021-08-08: 0.4 mg via ORAL
  Filled 2021-08-07: qty 1

## 2021-08-07 MED ORDER — EZETIMIBE 10 MG PO TABS
10.0000 mg | ORAL_TABLET | Freq: Every day | ORAL | Status: DC
  Administered 2021-08-08: 10 mg via ORAL
  Filled 2021-08-07 (×2): qty 1

## 2021-08-07 MED ORDER — VITAMIN D 25 MCG (1000 UNIT) PO TABS
1000.0000 [IU] | ORAL_TABLET | Freq: Every day | ORAL | Status: DC
  Administered 2021-08-08: 1000 [IU] via ORAL
  Filled 2021-08-07: qty 1

## 2021-08-07 MED ORDER — ZOLPIDEM TARTRATE 5 MG PO TABS: 5.0000 mg | ORAL_TABLET | Freq: Every evening | ORAL | Status: DC | PRN

## 2021-08-07 MED ORDER — ACETAMINOPHEN 325 MG PO TABS: 650.0000 mg | ORAL_TABLET | ORAL | Status: DC | PRN

## 2021-08-07 MED ORDER — PANTOPRAZOLE SODIUM 40 MG PO TBEC
40.0000 mg | DELAYED_RELEASE_TABLET | Freq: Every day | ORAL | Status: DC
  Administered 2021-08-08: 40 mg via ORAL
  Filled 2021-08-07: qty 1

## 2021-08-07 MED ORDER — RIVAROXABAN 20 MG PO TABS
20.0000 mg | ORAL_TABLET | Freq: Every day | ORAL | Status: DC
  Administered 2021-08-08: 20 mg via ORAL
  Filled 2021-08-07 (×2): qty 1

## 2021-08-07 MED ORDER — METOPROLOL TARTRATE 50 MG PO TABS
50.0000 mg | ORAL_TABLET | Freq: Two times a day (BID) | ORAL | Status: DC
  Administered 2021-08-08 (×2): 50 mg via ORAL
  Filled 2021-08-07 (×2): qty 1

## 2021-08-07 MED ORDER — IOHEXOL 350 MG/ML SOLN
75.0000 mL | Freq: Once | INTRAVENOUS | Status: AC | PRN
  Administered 2021-08-07: 75 mL via INTRAVENOUS

## 2021-08-07 MED ORDER — STROKE: EARLY STAGES OF RECOVERY BOOK: Freq: Once | Status: AC

## 2021-08-07 MED ORDER — LATANOPROST 0.005 % OP SOLN
1.0000 [drp] | Freq: Every day | OPHTHALMIC | Status: DC
  Filled 2021-08-07: qty 2.5

## 2021-08-07 NOTE — Progress Notes (Signed)
Neurology Telephone Note  Contacted by phone by Dr. Jacqualine Code about this patient who presented with transient diplopia accompanied by transient global amnesia lasting this AM, resolved. Now neuro intact. He was started on eliquis 2 days ago for a fib. CTH showed asymmetric hyperdensity involving peripheral L MCA branches concerning for acute intraluminal thrombus but subsequent CTA H&N did not show an acute thrombus.   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 Since we are continuing his eliquis it would be prudent to fully characterize that defect with CTA chest although his current presentation does not correlate with a thoracic aortic dissection. If CTA chest unremarkable, admit for TIA/TGA workup:  - Relative permissive HTN up to 180 sbp until acute stroke ruled out by MRI - MRI brain wo contrast - TTE - Check A1c and LDL + add statin per guidelines - Continue eliquis, no neuro indication to add antiplatelet in setting of therapeutic anticoagulation - q4 hr neuro checks - STAT head CT for any change in neuro exam - Tele - PT/OT/SLP - Stroke education - F/u with his established outpatient neurologist after discharge  I will formally see patient in AM.  Wayne Monks, MD [facility] [phone]  If 7pm- 7am, please page neurology on call as listed in [place].

## 2021-08-07 NOTE — ED Provider Notes (Signed)
[facility] Emergency Department Provider Note   ____________________________________________   Event Date/Time   First MD Initiated Contact with Patient 08/07/21 1657     (approximate)  I have reviewed the triage vital signs and the nursing notes.   HISTORY  Chief Complaint Altered Mental Status and Dizziness    HPI Wayne Scott is a 70 y.o. male noticed last night a strange feeling hard to describe just felt a little bit off or as though his head was a little off.  This morning woke up with again the same feeling that about noon had a brief episode about 30 seconds of double vision and also could not recall anything that he had done this morning.  The symptoms have now resolved.  There is no weakness  No headache.  No chest pain.  He is on Xarelto and took last dose last evening  No chest pain.  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   Arthritis    Diabetes mellitus without complication (HCC)    GERD (gastroesophageal reflux disease)    Headache    Heart murmur    History of kidney stones    HLD (hyperlipidemia)    Hypertension    Macular degeneration    OSA on CPAP    Paroxysmal atrial fibrillation ([place])    Psoriasis     Patient Active Problem List   Diagnosis Date Noted   Diplopia 08/07/2021   AF (paroxysmal atrial fibrillation) ([place]) 08/07/2021   History of dissection of thoracic aorta 08/07/2021   BPH (benign prostatic hyperplasia) 08/07/2021   Bloating 01/15/2020   Subconjunctival hemorrhage 01/15/2020   Hyperlipidemia 10/27/2019   Anxiety 03/31/2019   Diabetes ([place]) 03/31/2019   Ureteral calculus, left 11/05/2017   Obesity (BMI 30-39.9) 10/23/2017   Constipation 07/02/2017   Sleeping difficulty 07/02/2017   Obstructive sleep apnea 11/14/2015   Psoriasis 11/14/2015   Essential hypertension  09/19/2015   Dyspnea 09/19/2015   Lightheadedness 09/19/2015   Psoriatic arthritis ([place]) 09/19/2015   GERD (gastroesophageal reflux disease) 09/19/2015    Past Surgical History:  Procedure Laterality Date   ASCENDING AORTA GRAFT WITH CARDIOPULMONARY BYPASS; VALVE SUSPENSION WITH CORONARY RECONSTRUCTION AND VALVE-SPARING AORTIC ROOT REMODELING (DAVID PROCEDURE, YACOUB PROCEDURE)  08/11/2020   performed at [facility] 07/13/2021   Procedure: COLONOSCOPY;  Surgeon: Annamaria Helling, DO;  Location: [facility] ENDOSCOPY;  Service: Gastroenterology;  Laterality: N/A;   CYSTOSCOPY WITH INSERTION OF UROLIFT N/A 03/13/2021   Procedure: CYSTOSCOPY WITH INSERTION OF UROLIFT;  Surgeon: Hollice Espy, MD;  Location: [facility] ORS;  Service: Urology;  Laterality: N/A;   EXTRACORPOREAL SHOCK WAVE LITHOTRIPSY Left 11/14/2017   Procedure: EXTRACORPOREAL SHOCK WAVE LITHOTRIPSY (ESWL);  Surgeon: Hollice Espy, MD;  Location: [facility] ORS;  Service: Urology;  Laterality: Left;   EYE SURGERY     NASAL SINUS SURGERY  1991   PENILE PROSTHESIS IMPLANT  02/2020   TONSILLECTOMY     VASECTOMY      Prior to Admission medications   Medication Sig Start Date End Date Taking? Authorizing Provider  acetaminophen (TYLENOL) 500 MG tablet Take 1,000 mg by mouth every 6 (six) hours as needed for moderate pain.   Yes [provider]  acetic acid-hydrocortisone (VOSOL-HC) OTIC solution Place 1 drop into both ears daily as  needed (irritation). 11/12/20  Yes [provider]  amLODipine (NORVASC) 10 MG tablet Take 10 mg by mouth daily. 12/09/20  Yes [provider]  B Complex-C (SUPER B COMPLEX PO) Take 1 tablet by mouth daily.   Yes [provider]  cholecalciferol (VITAMIN D) 1000 units tablet Take 1,000 Units by mouth daily.   Yes [provider]  ezetimibe (ZETIA) 10 MG tablet Take 10 mg by mouth daily.   Yes [provider]   fluticasone (FLONASE) 50 MCG/ACT nasal spray Place 2 sprays into both nostrils daily. Patient taking differently: Place 2 sprays into both nostrils daily as needed for allergies. 04/26/20  Yes Leone Haven, MD  folic acid (FOLVITE) 1 MG tablet Take 1 mg by mouth daily. 01/24/21  Yes [provider]  latanoprost (XALATAN) 0.005 % ophthalmic solution Place 1 drop into both eyes at bedtime. 11/14/20  Yes [provider]  metFORMIN (GLUCOPHAGE) 500 MG tablet Take 500 mg by mouth daily. 12/09/20 12/09/21 Yes [provider]  methotrexate (RHEUMATREX) 2.5 MG tablet Take 12.5 mg by mouth every Tuesday. 01/24/21  Yes [provider]  metoprolol tartrate (LOPRESSOR) 50 MG tablet Take 50 mg by mouth in the morning and at bedtime. 12/09/20 12/09/21 Yes [provider]  Multiple Vitamin (MULTIVITAMIN) capsule Take 1 capsule by mouth daily.   Yes [provider]  Multiple Vitamins-Minerals (PRESERVISION AREDS 2 PO) Take 1 capsule by mouth in the morning and at bedtime.   Yes [provider]  naproxen sodium (ALEVE) 220 MG tablet Take 220 mg by mouth 2 (two) times daily as needed (pain).   Yes [provider]  Omega-3 Fatty Acids (FISH OIL) 1000 MG CAPS Take 4,000 mg by mouth daily.   Yes [provider]  omeprazole (PRILOSEC) 20 MG capsule TAKE 1 CAPSULE DAILY Patient taking differently: Take 20 mg by mouth daily. 11/30/20  Yes Leone Haven, MD  polyethylene glycol powder (GLYCOLAX/MIRALAX) powder TAKE 17 G BY MOUTH DAILY AS NEEDED FOR MILD CONSTIPATION. 11/08/17  Yes Leone Haven, MD  rivaroxaban (XARELTO) 20 MG TABS tablet Take 1 tablet by mouth at bedtime. 08/04/21  Yes [provider]  Secukinumab (COSENTYX [place]) Inject 1 application into the skin every 30 (thirty) days.   Yes [provider]  silodosin (RAPAFLO) 8 MG CAPS capsule Take 1 capsule (8 mg total) by mouth daily with breakfast. 06/12/21  Yes  Hollice Espy, MD  Turmeric (QC TUMERIC COMPLEX PO) Take 2 capsules by mouth daily.   Yes [provider]  zolpidem (AMBIEN) 5 MG tablet Take 5 mg by mouth at bedtime as needed (sleep).   Yes [provider]  blood glucose meter kit and supplies KIT Dispense based on patient and insurance preference. Use once daily as directed. (FOR ICD-10 E11.9). 08/20/20   Leone Haven, MD  HYDROcodone-acetaminophen (NORCO/VICODIN) 5-325 MG tablet Take 1-2 tablets by mouth every 6 (six) hours as needed for moderate pain. Patient not taking: Reported on 08/07/2021 03/13/21   Hollice Espy, MD    Allergies Atorvastatin, Pravastatin, Fluvastatin, and Rosuvastatin  Family History  Problem Relation Age of Onset   Neuropathy Mother    Stroke Mother    Macular degeneration Mother    Diabetes Father    Arthritis Other        Parent   Hypertension Other        Parent   Diabetes Other        Parent  Heart attack Brother    Kidney cancer Brother    Bladder Cancer Neg Hx    Prostate cancer Neg Hx     Social History Social History   Tobacco Use   Smoking status: Never   Smokeless tobacco: Never  Substance Use Topics   Alcohol use: Yes    Alcohol/week: 2.0 standard drinks    Types: 1 Cans of beer, 1 Shots of liquor per week    Comment: once or twice a month   Drug use: No    Review of Systems Constitutional: No fever/chills Eyes: No visual changes now, they have completely resolved. ENT: No sore throat. Cardiovascular: Denies chest pain. Respiratory: Denies shortness of breath. Gastrointestinal: No abdominal pain.   Genitourinary: Negative for dysuria. Musculoskeletal: Negative for back pain. Skin: Negative for rash. Neurological: Negative for headaches, areas of focal weakness or numbness.  Reports his "head felt funny" beginning last evening    ____________________________________________   PHYSICAL EXAM:  VITAL SIGNS: ED Triage Vitals  Enc Vitals Group      BP 08/07/21 1310 (!) 144/79     Pulse Rate 08/07/21 1310 65     Resp 08/07/21 1310 20     Temp 08/07/21 1310 98 F (36.7 C)     Temp Source 08/07/21 1310 Oral     SpO2 08/07/21 1310 96 %     Weight 08/07/21 1310 260 lb (117.9 kg)     Height 08/07/21 1310 6' (1.829 m)     Head Circumference --      Peak Flow --      Pain Score 08/07/21 1323 0     Pain Loc --      Pain Edu? --      Excl. in [place]? --     Constitutional: Alert and oriented. Well appearing and in no acute distress. Eyes: Conjunctivae are normal. Head: Atraumatic. Nose: No congestion/rhinnorhea. Mouth/Throat: Mucous membranes are moist. Neck: No stridor.  Cardiovascular: Normal rate, regular rhythm. Grossly normal heart sounds.  Good peripheral circulation. Respiratory: Normal respiratory effort.  No retractions. Lungs CTAB. Gastrointestinal: Soft and nontender. No distention. Musculoskeletal: No lower extremity tenderness nor edema. Neurologic:  Normal speech and language. No gross focal neurologic deficits are appreciated. mNIHSS = 0 at this time. VAN negative (no muscle weakness) Skin:  Skin is warm, dry and intact. No rash noted. Psychiatric: Mood and affect are normal. Speech and behavior are normal.  ____________________________________________   LABS (all labs ordered are listed, but only abnormal results are displayed)  Labs Reviewed  CBC - Abnormal; Notable for the following components:      Result Value   Hemoglobin 17.3 (*)    All other components within normal limits  COMPREHENSIVE METABOLIC PANEL - Abnormal; Notable for the following components:   Glucose, Bld 121 (*)    All other components within normal limits  PROTIME-INR - Abnormal; Notable for the following components:   Prothrombin Time 16.3 (*)    INR 1.3 (*)    All other components within normal limits  CBC - Abnormal; Notable for the following components:   MCHC 36.3 (*)    All other components within normal limits  URINALYSIS,  ROUTINE W REFLEX MICROSCOPIC - Abnormal; Notable for the following components:   Color, Urine YELLOW (*)    APPearance CLEAR (*)    pH 9.0 (*)    All other components within normal limits  RESP PANEL BY RT-PCR (FLU A&B, COVID) ARPGX2  DIFFERENTIAL  ETHANOL  APTT  DIFFERENTIAL  URINE DRUG SCREEN, QUALITATIVE ([facility] ONLY)  HIV ANTIBODY (ROUTINE TESTING W REFLEX)  HEMOGLOBIN A1C  LIPID PANEL   ____________________________________________  EKG  Reviewed inter by me at 1359 heart rate 65 QRS 109 QTc 420 Normal sinus rhythm, mild nonspecific T wave abnormality noted.  No evidence of acute STEMI ____________________________________________  RADIOLOGY  CT ANGIO HEAD NECK W WO CM  Result Date: 08/07/2021 CLINICAL DATA:  Altered mental status, double vision in right eye EXAM: CT ANGIOGRAPHY HEAD AND NECK TECHNIQUE: Multidetector CT imaging of the head and neck was performed using the standard protocol during bolus administration of intravenous contrast. Multiplanar CT image reconstructions and MIPs were obtained to evaluate the vascular anatomy. Carotid stenosis measurements (when applicable) are obtained utilizing NASCET criteria, using the distal internal carotid diameter as the denominator. CONTRAST:  14m OMNIPAQUE IOHEXOL 350 MG/ML SOLN COMPARISON:  Same-day noncontrast CT head FINDINGS: CT HEAD FINDINGS Brain: There is no evidence of acute intracranial hemorrhage, extra-axial fluid collection, or acute infarct. The ventricles are stable in size. There is no midline shift. There is no mass lesion. Vascular: See below Skull: Normal. Negative for fracture or focal lesion. Sinuses: The imaged paranasal sinuses are clear. Orbits: Bilateral lens implants are in place. The globes and orbits are otherwise unremarkable. Review of the MIP images confirms the above findings CTA NECK FINDINGS The vasculature is suboptimally assessed due to poor timing of the contrast bolus probably related to the aortic  dissection. Additionally, there is significant venous reflux resulting in streak artifact throughout the neck. Aortic arch: There has been interval repair of the previously seen thoracic aortic aneurysm and dissection. Linear filling defects in the aorta may reflect dissection flaps or surgical material. This is incompletely evaluated on the current study. Right carotid system: No evidence of dissection, stenosis (50% or greater) or occlusion. Left carotid system: No evidence of dissection, stenosis (50% or greater) or occlusion. Vertebral arteries: The proximal vertebral arteries are not assessed due to significant venous contamination. The distal V2 and V3 segments are patent. There is no definite stenosis, dissection, or occlusion. Skeleton: There is mild multilevel degenerative change of the cervical spine. There is degenerative change of both temporomandibular joints. Other neck: The soft tissues are unremarkable. Upper chest: The lung apices are clear. Review of the MIP images confirms the above findings CTA HEAD FINDINGS Anterior circulation: There is calcification of the bilateral intracranial ICAs without hemodynamically significant stenosis or occlusion. The bilateral MCAs and ACAs are patent. There is no aneurysm. Posterior circulation: The V4 segments of the vertebral arteries are patent. The basilar artery is patent. The bilateral PCAs are patent. There is no significant stenosis or occlusion. There is no aneurysm. Venous sinuses: Not well assessed. Anatomic variants: None. Review of the MIP images confirms the above findings IMPRESSION: Suboptimal study due to poor contrast timing and significant venous contamination in the neck. 1. The proximal vertebral arteries are not assessed due to significant venous contamination. The remaining vasculature of the head and neck is patent, with no significant stenosis, dissection, occlusion, or aneurysm. 2. No acute intracranial hemorrhage or infarct. 3. Interval  repair of a thoracic aortic aneurysm and dissection. Linear filling defects in the thoracic aorta may reflect dissection flaps or surgical material. This is incompletely evaluated on the current study. Consider dedicated CTA of the chest for better evaluation if indicated. Electronically Signed   By: PValetta MoleM.D.   On: 08/07/2021 18:40   CT HEAD WO CONTRAST  Result Date:  08/07/2021 CLINICAL DATA:  Delirium, double vision EXAM: CT HEAD WITHOUT CONTRAST TECHNIQUE: Contiguous axial images were obtained from the base of the skull through the vertex without intravenous contrast. COMPARISON:  None. FINDINGS: Brain: Normal anatomic configuration. Parenchymal volume loss is commensurate with the patient's age. Mild periventricular white matter changes are present likely reflecting the sequela of small vessel ischemia. No abnormal intra or extra-axial mass lesion or fluid collection. No abnormal mass effect or midline shift. No evidence of acute intracranial hemorrhage or infarct. Ventricular size is normal. Cerebellum unremarkable. Vascular: There is asymmetric hyperdensity involving the left M2 and M3 branches within the left sylvian fissure, best seen on axial image # 15-17/2. Acute intraluminal thrombus is not excluded. Skull: Intact Sinuses/Orbits: Paranasal sinuses are clear. Orbits are unremarkable. Other: Mastoid air cells and middle ear cavities are clear. IMPRESSION: Asymmetric hyperdensity involving the peripheral left MCA branches. Acute intraluminal thrombus is not excluded and correlation with clinical examination is recommended. If abnormal, CT arteriography would be helpful for further evaluation. No evidence of acute cortical infarct at this time. Attempts are being made to contact the patient is managing clinician for direct verbal communication of these findings at this time Electronically Signed   By: Fidela Salisbury M.D.   On: 08/07/2021 14:43     CT imaging reviewed.  Notable for some  concerning findings of possible thrombus on the noncontrast head CT, however follow-up CT angiography does not demonstrate clear occlusion. "The proximal vertebral arteries are not assessed due to significant venous contamination. The remaining vasculature of the head and neck is patent, with no significant stenosis, dissection, occlusion, or aneurysm."  Personally discussed imaging with radiologist ____________________________________________   PROCEDURES  Procedure(s) performed: None  Procedures  Critical Care performed: Yes, see critical care note(s)  CRITICAL CARE Performed by: Delman Kitten   Total critical care time: 30 minutes  Critical care time was exclusive of separately billable procedures and treating other patients.  Critical care was necessary to treat or prevent imminent or life-threatening deterioration.  Critical care was time spent personally by me on the following activities: development of treatment plan with patient and/or surrogate as well as nursing, discussions with consultants, evaluation of patient's response to treatment, examination of patient, obtaining history from patient or surrogate, ordering and performing treatments and interventions, ordering and review of laboratory studies, ordering and review of radiographic studies, pulse oximetry and re-evaluation of patient's condition.  ____________________________________________   INITIAL IMPRESSION / ASSESSMENT AND PLAN / ED COURSE  Pertinent labs & imaging results that were available during my care of the patient were reviewed by me and considered in my medical decision making (see chart for details).   Patient presents for episode of brief abnormal neurologic activity including double vision as well as amnesia that seems to is resolved.  Symptoms occurred about noon but were preceded by approximately 12+ hours of a strange hard to describe feeling.  Imaging of note is very concerning for possible thrombus  involving the left MCA.  I discussed the case emergently with Dr. Quinn Axe who recommends CT angiography which has been ordered.  The patient luckily at this time seems to have recovered neurologically and I do not see evidence of acute deficit, in particular he has no evidence of large vessel occlusion without associated muscular weakness at this time.  However CT angiography is pending  Denies acute cardiac or pulmonary symptoms no acute or recent infectious symptoms.  Did recently start Xarelto last dose was last evening and  he is currently anticoagulated on once daily dosing  Clinical Course as of 08/07/21 2316  Mon Aug 07, 2021  1926 CTA head:  1. The proximal vertebral arteries are not assessed due to  significant venous contamination. The remaining vasculature of the  head and neck is patent, with no significant stenosis, dissection,  occlusion, or aneurysm.  2. No acute intracranial hemorrhage or infarct.  3. 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  Linear filling defects in the thoracic aorta may reflect dissection  flaps or surgical material. This is incompletely evaluated on the  current study. Consider dedicated CTA of the chest for better  evaluation if indicated.  [MQ]  1927 Of note, the patient has no complaint that would be suggestive of acute aortic aneurysm, no chest pain no chest symptoms no pulmonary symptoms no back pain.  Symptoms seem to be primarily neurologic.  Consulted with Dr. Quinn Axe, and will admit the patient for further care and management including neurologic work-up [MQ]  2022 Reviewed CT angiogram findings, there is not concern for possible flap in the aorta.  Review of the patient's previous MR a of the neck also demonstrated some prior abnormality there, and I question if this could be a stable finding. [MQ]  2023 Have requested radiologist  [MQ]  2023 Make comparison to previous MRA of the neck [MQ]  2036 Discussed CT angiogram of the neck  including dissection flap-like finding with Dr. Valetta Mole (rads), and upon comparison to the patient is MRA of the neck in June it appears that this is a stable finding.  Especially in the setting of his symptoms presenting today, it appears that this would not represent a new finding and is likely a stable finding. [MQ]    Clinical Course User Index [MQ] Delman Kitten, MD   Admission discussed with Dr. Flossie Buffy  ____________________________________________   FINAL CLINICAL IMPRESSION(S) / ED DIAGNOSES  Final diagnoses:  Confusion  Amnestic state        Note:  This document was prepared using Dragon voice recognition software and may include unintentional dictation errors       Delman Kitten, MD 08/07/21 2316

## 2021-08-07 NOTE — ED Notes (Signed)
Pt OTF with MRI

## 2021-08-07 NOTE — ED Notes (Signed)
MD messaged via secure chat as patient requesting food.   Per MD okay to eat.   Pt given meal tray, ice water - call light in reach.  Urine specimen sent to lab via PTS.

## 2021-08-07 NOTE — ED Triage Notes (Signed)
Pt to ED for altered mental status, double vision in right eye (states felt like one eye was looking up while the other was looking down), just started eliquis for a fib 2 days ago.  States had confusion before, was seen at neuro and was told it was d/t a fib  Denies altered vision at this time, states only lasted 2 minutes. Pt is not confused at this time.  Verbal orders Dr Joni Fears, no code stroke

## 2021-08-07 NOTE — ED Notes (Signed)
MD at bedside, defers MRI screening at this time.

## 2021-08-07 NOTE — H&P (Signed)
History and Physical    Wayne Scott NLZ:[id number] DOB: July 24, 1951 DOA: 08/07/2021  PCP: Leonel Ramsay, MD  Patient coming from: Home I have personally briefly reviewed patient's old medical records in [place]  Chief Complaint: Diplopia and confusion  HPI: Wayne [place] is a 70 y.o. 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   Patient reports that earlier this morning before noon he begin to have confusion and wife at bedside says he was saying things that did not make sense.  Patient also did not recall any of these events.  He then later also developed double vision and decided to present to the ED.  He also recalls that last night he was feeling dizzy and unwell prior to going to bed.  States he would be dizzy every time he got up to use the bathroom.  This morning he just woke up continue to feel "tense and anxious."  He had some heart pounding and palpitation symptoms in bed last night.  Denies any chest pain or shortness of breath.  No nausea, vomiting or diarrhea.   Patient seen by neurology in May for numerous episodes of transient global amnesia and transient vison loss in the past and was sent to cardiology for heart monitoring and recently diagnosed with paroxysmal atrial fibrillation. Started taking Xarelto 2 days ago and states he has had only 2 doses.  Denies tobacco or alcohol use.  Has history of remote marijuana use in his 75s.  Patient was afebrile, normotensive on room air.  No leukocytosis or anemia.  CMP otherwise unremarkable.  BG of 121.  Negative COVID and flu PCR.  EtOH is less than 10  CT head shows asymmetric hyperdensity involving the peripheral left MCA branch with acute intraluminal  thrombus not excluded. CTA head and neck were suboptimal but did not show any significant stenosis, dissection, occlusion or aneurysm.  However, there was incidental finding of linear filling defect in the thoracic aorta. Tele-neuro has evaluated patient and would like patient to be admitted for stroke workup.  ED physician Dr. Jacqualine Code spoke with radiology regarding filling defect findings and per radiology that this appeared to be chronic when compared to recent MRI in June.Given this and lack of chest pain, decision was made to not obtain further chest imaging.   Review of Systems: Constitutional: No Weight Change, No Fever ENT/Mouth: No sore throat, No Rhinorrhea Eyes: No Eye Pain, No Vision Changes Cardiovascular: No Chest Pain, no SOB, No PND, No Dyspnea on Exertion, No Orthopnea,  No Edema, No Palpitations Respiratory: No Cough, No Sputum Gastrointestinal: No Nausea, No Vomiting, No Diarrhea, No Constipation, No Pain Genitourinary: no Urinary Incontinence Musculoskeletal: No Arthralgias, No Myalgias Skin: No Skin Lesions, No Pruritus, Neuro:+Weakness, No Numbness,  No Loss of Consciousness, No Syncope Psych: No Anxiety/Panic, No Depression, no decrease appetite Heme/Lymph: No Bruising, No Bleeding  Past Medical History:  Diagnosis Date   Allergic rhinitis    Anxiety    Aortic aneurysm and dissection (HCC) 07/2020   Arthritis    Diabetes mellitus without complication (HCC)    GERD (gastroesophageal reflux disease)    Headache    Heart murmur    History of kidney stones    HLD (hyperlipidemia)    Hypertension    Macular degeneration  OSA on CPAP    Paroxysmal atrial fibrillation (HCC)    Psoriasis     Past Surgical History:  Procedure Laterality Date   ASCENDING AORTA GRAFT WITH CARDIOPULMONARY BYPASS; VALVE SUSPENSION WITH CORONARY RECONSTRUCTION AND VALVE-SPARING AORTIC ROOT REMODELING (DAVID PROCEDURE, YACOUB PROCEDURE)  08/11/2020   performed at [place] 07/13/2021   Procedure: COLONOSCOPY;  Surgeon: Annamaria Helling, DO;  Location: [facility] ENDOSCOPY;  Service: Gastroenterology;  Laterality: N/A;   CYSTOSCOPY WITH INSERTION OF UROLIFT N/A 03/13/2021   Procedure: CYSTOSCOPY WITH INSERTION OF UROLIFT;  Surgeon: Hollice Espy, MD;  Location: [facility] ORS;  Service: Urology;  Laterality: N/A;   EXTRACORPOREAL SHOCK WAVE LITHOTRIPSY Left 11/14/2017   Procedure: EXTRACORPOREAL SHOCK WAVE LITHOTRIPSY (ESWL);  Surgeon: Hollice Espy, MD;  Location: [facility] ORS;  Service: Urology;  Laterality: Left;   EYE SURGERY     NASAL SINUS SURGERY  1991   PENILE PROSTHESIS IMPLANT  02/2020   TONSILLECTOMY     VASECTOMY       reports that he has never smoked. He has never used smokeless tobacco. He reports current alcohol use of about 2.0 standard drinks per week. He reports that he does not use drugs. Social History  Allergies  Allergen Reactions   Atorvastatin Other (See Comments)    Leg cramps    Pravastatin Other (See Comments)    Leg cramps    Fluvastatin     Other reaction(s): Muscle Pain   Rosuvastatin Other (See Comments)    Family History  Problem Relation Age of Onset   Neuropathy Mother    Stroke Mother    Macular degeneration Mother    Diabetes Father    Arthritis Other        Parent   Hypertension Other        Parent   Diabetes Other        Parent   Heart attack Brother    Kidney cancer Brother    Bladder Cancer Neg Hx    Prostate cancer Neg Hx      Prior to Admission medications   Medication Sig Start Date End Date Taking? Authorizing Provider  acetaminophen (TYLENOL) 500 MG tablet Take 1,000 mg by mouth every 6 (six) hours as needed for moderate pain.   Yes [provider]  acetic acid-hydrocortisone (VOSOL-HC) OTIC solution Place 1 drop into both ears daily as needed (irritation). 11/12/20  Yes [provider]  amLODipine (NORVASC) 10 MG tablet Take 10 mg by mouth  daily. 12/09/20  Yes [provider]  B Complex-C (SUPER B COMPLEX PO) Take 1 tablet by mouth daily.   Yes [provider]  cholecalciferol (VITAMIN D) 1000 units tablet Take 1,000 Units by mouth daily.   Yes [provider]  ezetimibe (ZETIA) 10 MG tablet Take 10 mg by mouth daily.   Yes [provider]  fluticasone (FLONASE) 50 MCG/ACT nasal spray Place 2 sprays into both nostrils daily. Patient taking differently: Place 2 sprays into both nostrils daily as needed for allergies. 04/26/20  Yes Leone Haven, MD  folic acid (FOLVITE) 1 MG tablet Take 1 mg by mouth daily. 01/24/21  Yes [provider]  latanoprost (XALATAN) 0.005 % ophthalmic solution Place 1 drop into both eyes at bedtime. 11/14/20  Yes [provider]  metFORMIN (GLUCOPHAGE) 500 MG tablet Take 500 mg by mouth daily. 12/09/20 12/09/21 Yes [provider]  methotrexate (RHEUMATREX) 2.5 MG tablet Take  12.5 mg by mouth every Tuesday. 01/24/21  Yes [provider]  metoprolol tartrate (LOPRESSOR) 50 MG tablet Take 50 mg by mouth in the morning and at bedtime. 12/09/20 12/09/21 Yes [provider]  Multiple Vitamin (MULTIVITAMIN) capsule Take 1 capsule by mouth daily.   Yes [provider]  Multiple Vitamins-Minerals (PRESERVISION AREDS 2 PO) Take 1 capsule by mouth in the morning and at bedtime.   Yes [provider]  naproxen sodium (ALEVE) 220 MG tablet Take 220 mg by mouth 2 (two) times daily as needed (pain).   Yes [provider]  Omega-3 Fatty Acids (FISH OIL) 1000 MG CAPS Take 4,000 mg by mouth daily.   Yes [provider]  omeprazole (PRILOSEC) 20 MG capsule TAKE 1 CAPSULE DAILY Patient taking differently: Take 20 mg by mouth daily. 11/30/20  Yes Leone Haven, MD  polyethylene glycol powder (GLYCOLAX/MIRALAX) powder TAKE 17 G BY MOUTH DAILY AS NEEDED FOR MILD CONSTIPATION. 11/08/17  Yes Leone Haven, MD   rivaroxaban (XARELTO) 20 MG TABS tablet Take 1 tablet by mouth at bedtime. 08/04/21  Yes [provider]  Secukinumab (COSENTYX [place]) Inject 1 application into the skin every 30 (thirty) days.   Yes [provider]  silodosin (RAPAFLO) 8 MG CAPS capsule Take 1 capsule (8 mg total) by mouth daily with breakfast. 06/12/21  Yes Hollice Espy, MD  Turmeric (QC TUMERIC COMPLEX PO) Take 2 capsules by mouth daily.   Yes [provider]  zolpidem (AMBIEN) 5 MG tablet Take 5 mg by mouth at bedtime as needed (sleep).   Yes [provider]  blood glucose meter kit and supplies KIT Dispense based on patient and insurance preference. Use once daily as directed. (FOR ICD-10 E11.9). 08/20/20   Leone Haven, MD  HYDROcodone-acetaminophen (NORCO/VICODIN) 5-325 MG tablet Take 1-2 tablets by mouth every 6 (six) hours as needed for moderate pain. Patient not taking: Reported on 08/07/2021 03/13/21   Hollice Espy, MD    Physical Exam: Vitals:   08/07/21 1704 08/07/21 1745 08/07/21 2025 08/07/21 2100  BP: (!) 142/78 (!) 141/80 (!) 146/83 (!) 142/74  Pulse: (!) 59 (!) 59 (!) 55 65  Resp: _0 Temp:      TempSrc:      SpO2: 96% 97% 98% 96%  Weight:      Height:        Constitutional: NAD, calm, comfortable, obese elderly gentleman appearing younger than stated age laying at approximately 20 degree incline in bed Vitals:   08/07/21 1704 08/07/21 1745 08/07/21 2025 08/07/21 2100  BP: (!) 142/78 (!) 141/80 (!) 146/83 (!) 142/74  Pulse: (!) 59 (!) 59 (!) 55 65  Resp: _1 Temp:      TempSrc:      SpO2: 96% 97% 98% 96%  Weight:      Height:       Eyes: PERRL, lids and conjunctivae normal ENMT: Mucous membranes are moist.  Neck: normal, supple Respiratory: clear to auscultation bilaterally, no wheezing, no crackles. Normal respiratory effort. No accessory muscle use.  Cardiovascular: Regular rate and rhythm, no murmurs / rubs / gallops. No extremity  edema.  No carotid bruits.  Abdomen: no tenderness, no masses palpated.  Bowel sounds positive.  Musculoskeletal: no clubbing / cyanosis. No joint deformity upper and lower extremities. Good ROM, no contractures. Normal muscle tone.  Skin: no rashes, lesions, ulcers. No induration Neurologic: CN 2-12 grossly intact.  No nystagmus.  Sensation intact, Strength 5/5 in all 4.  Intact heel-to-shin.  Intact finger-nose. Psychiatric: Normal judgment and insight. Alert and oriented x 3. Normal mood.     Labs on Admission: I have personally reviewed following labs and imaging studies  CBC: Recent Labs  Lab 08/07/21 1322 08/07/21 1736  WBC 8.7 9.5  NEUTROABS 5.1 5.7  HGB 17.3* 16.5  HCT 48.0 45.4  MCV 89.6 90.6  PLT 197 109   Basic Metabolic Panel: Recent Labs  Lab 08/07/21 1322  NA 135  K 4.1  CL 100  CO2 23  GLUCOSE 121*  BUN 19  CREATININE 0.98  CALCIUM 9.7   GFR: Estimated Creatinine Clearance: 93 mL/min (by C-G formula based on SCr of 0.98 mg/dL). Liver Function Tests: Recent Labs  Lab 08/07/21 1322  AST 24  ALT 20  ALKPHOS 70  BILITOT 1.0  PROT 7.6  ALBUMIN 4.2   No results for input(s): LIPASE, AMYLASE in the last 168 hours. No results for input(s): AMMONIA in the last 168 hours. Coagulation Profile: Recent Labs  Lab 08/07/21 1736  INR 1.3*   Cardiac Enzymes: No results for input(s): CKTOTAL, CKMB, CKMBINDEX, TROPONINI in the last 168 hours. BNP (last 3 results) No results for input(s): PROBNP in the last 8760 hours. HbA1C: No results for input(s): HGBA1C in the last 72 hours. CBG: No results for input(s): GLUCAP in the last 168 hours. Lipid Profile: No results for input(s): CHOL, HDL, LDLCALC, TRIG, CHOLHDL, LDLDIRECT in the last 72 hours. Thyroid Function Tests: No results for input(s): TSH, T4TOTAL, FREET4, T3FREE, THYROIDAB in the last 72 hours. Anemia Panel: No results for input(s): VITAMINB12, FOLATE, FERRITIN, TIBC, IRON, RETICCTPCT in the  last 72 hours. Urine analysis:    Component Value Date/Time   COLORURINE YELLOW (A) 08/07/2021 1736   APPEARANCEUR CLEAR (A) 08/07/2021 1736   APPEARANCEUR Hazy (A) 04/04/2021 0847   LABSPEC 1.023 08/07/2021 1736   PHURINE 9.0 (H) 08/07/2021 1736   GLUCOSEU NEGATIVE 08/07/2021 1736   HGBUR NEGATIVE 08/07/2021 1736   BILIRUBINUR NEGATIVE 08/07/2021 1736   BILIRUBINUR Negative 04/04/2021 0847   KETONESUR NEGATIVE 08/07/2021 1736   PROTEINUR NEGATIVE 08/07/2021 1736   NITRITE NEGATIVE 08/07/2021 1736   LEUKOCYTESUR NEGATIVE 08/07/2021 1736    Radiological Exams on Admission: CT ANGIO HEAD NECK W WO CM  Result Date: 08/07/2021 CLINICAL DATA:  Altered mental status, double vision in right eye EXAM: CT ANGIOGRAPHY HEAD AND NECK TECHNIQUE: Multidetector CT imaging of the head and neck was performed using the standard protocol during bolus administration of intravenous contrast. Multiplanar CT image reconstructions and MIPs were obtained to evaluate the vascular anatomy. Carotid stenosis measurements (when applicable) are obtained utilizing NASCET criteria, using the distal internal carotid diameter as the denominator. CONTRAST:  65m OMNIPAQUE IOHEXOL 350 MG/ML SOLN COMPARISON:  Same-day noncontrast CT head FINDINGS: CT HEAD FINDINGS Brain: There is no evidence of acute intracranial hemorrhage, extra-axial fluid collection, or acute infarct. The ventricles are stable in size. There is no midline shift. There is no mass lesion. Vascular: See below Skull: Normal. Negative for fracture or focal lesion. Sinuses: The imaged paranasal sinuses are clear. Orbits: Bilateral lens implants are in place. The globes and orbits are otherwise unremarkable. Review of the MIP images confirms the above findings CTA NECK FINDINGS The vasculature is suboptimally assessed due to poor timing of the contrast bolus probably related to the aortic dissection. Additionally, there is significant venous reflux resulting in streak  artifact throughout the neck. Aortic arch: There  has been interval repair of the previously seen thoracic aortic aneurysm and dissection. Linear filling defects in the aorta may reflect dissection flaps or surgical material. This is incompletely evaluated on the current study. Right carotid system: No evidence of dissection, stenosis (50% or greater) or occlusion. Left carotid system: No evidence of dissection, stenosis (50% or greater) or occlusion. Vertebral arteries: The proximal vertebral arteries are not assessed due to significant venous contamination. The distal V2 and V3 segments are patent. There is no definite stenosis, dissection, or occlusion. Skeleton: There is mild multilevel degenerative change of the cervical spine. There is degenerative change of both temporomandibular joints. Other neck: The soft tissues are unremarkable. Upper chest: The lung apices are clear. Review of the MIP images confirms the above findings CTA HEAD FINDINGS Anterior circulation: There is calcification of the bilateral intracranial ICAs without hemodynamically significant stenosis or occlusion. The bilateral MCAs and ACAs are patent. There is no aneurysm. Posterior circulation: The V4 segments of the vertebral arteries are patent. The basilar artery is patent. The bilateral PCAs are patent. There is no significant stenosis or occlusion. There is no aneurysm. Venous sinuses: Not well assessed. Anatomic variants: None. Review of the MIP images confirms the above findings IMPRESSION: Suboptimal study due to poor contrast timing and significant venous contamination in the neck. 1. The proximal vertebral arteries are not assessed due to significant venous contamination. The remaining vasculature of the head and neck is patent, with no significant stenosis, dissection, occlusion, or aneurysm. 2. No acute intracranial hemorrhage or infarct. 3. 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 Linear filling defects in  the thoracic aorta may reflect dissection flaps or surgical material. This is incompletely evaluated on the current study. Consider dedicated CTA of the chest for better evaluation if indicated. Electronically Signed   By: Valetta Mole M.D.   On: 08/07/2021 18:40   CT HEAD WO CONTRAST  Result Date: 08/07/2021 CLINICAL DATA:  Delirium, double vision EXAM: CT HEAD WITHOUT CONTRAST TECHNIQUE: Contiguous axial images were obtained from the base of the skull through the vertex without intravenous contrast. COMPARISON:  None. FINDINGS: Brain: Normal anatomic configuration. Parenchymal volume loss is commensurate with the patient's age. Mild periventricular white matter changes are present likely reflecting the sequela of small vessel ischemia. No abnormal intra or extra-axial mass lesion or fluid collection. No abnormal mass effect or midline shift. No evidence of acute intracranial hemorrhage or infarct. Ventricular size is normal. Cerebellum unremarkable. Vascular: There is asymmetric hyperdensity involving the left M2 and M3 branches within the left sylvian fissure, best seen on axial image # 15-17/2. Acute intraluminal thrombus is not excluded. Skull: Intact Sinuses/Orbits: Paranasal sinuses are clear. Orbits are unremarkable. Other: Mastoid air cells and middle ear cavities are clear. IMPRESSION: Asymmetric hyperdensity involving the peripheral left MCA branches. Acute intraluminal thrombus is not excluded and correlation with clinical examination is recommended. If abnormal, CT arteriography would be helpful for further evaluation. No evidence of acute cortical infarct at this time. Attempts are being made to contact the patient is managing clinician for direct verbal communication of these findings at this time Electronically Signed   By: Fidela Salisbury M.D.   On: 08/07/2021 14:43      Assessment/Plan  Transient global amnesia/vision loss - tele-neuro has evaluated and has recommended full stroke work  up. They will see in consultation tomorrow. - obtain MRI brain wo contrast - TTE - Check A1c and LDL + add statin per guidelines - Continue Eliquis,  no neuro indication to add antiplatelet in setting of therapeutic anticoagulation - q4 hr neuro checks - STAT head CT for any change in neuro exam - Tele - PT/OT/SLP  Hypertension - Hold amlodipine for permissive hypertension  Paroxysmal atrial fibrillation  -continue Xarelto and metoprolol   Type 2 diabetes  -check HbA1C  Hx of spontaneous thoracic aorta aneurysm - s/p repair 07/2020 - had incidental finding of linear defect on CTA head and neck.  Per radiology, this appears to be chronic and similar to previous MRI in June.  Patient also does not have any chest pain to warrant further imaging at this time.  HLD -hx of statin intolerance -continue Zetia   History of psoriatic arthritis - Continue weekly methotrexate Tuesday - on Costentyx monthly injection  Hx of BPH -s/p Urolift -continue silodosin  OSA -continue CPAP  Obesity BMI of 35  DVT prophylaxis:Xarelto  Code Status: Full Family Communication: Plan discussed with patient at bedside  disposition Plan: Home with observation Consults called:  Admission status: Observation  Level of care: Progressive Cardiac  Status is: Observation  The patient remains OBS appropriate and will d/c before 2 midnights.  Dispo: The patient is from: Home              Anticipated d/c is to: Home              Patient currently is not medically stable to d/c.   Difficult to place patient No         Orene Desanctis DO [facility]   If 7PM-7AM, please contact night-coverage [email or web address]   08/07/2021, 9:44 PM

## 2021-08-08 ENCOUNTER — Observation Stay
Admit: 2021-08-08 | Discharge: 2021-08-08 | Disposition: A | Discharge status: Home / Self Care | Payer: Medicare Other | Attending: Family Medicine | Admitting: Family Medicine

## 2021-08-08 ENCOUNTER — Encounter: Payer: Self-pay | Admitting: Family Medicine

## 2021-08-08 DIAGNOSIS — I480 Paroxysmal atrial fibrillation: Secondary | ICD-10-CM | POA: Diagnosis not present

## 2021-08-08 DIAGNOSIS — I10 Essential (primary) hypertension: Secondary | ICD-10-CM

## 2021-08-08 DIAGNOSIS — G459 Transient cerebral ischemic attack, unspecified: Secondary | ICD-10-CM

## 2021-08-08 DIAGNOSIS — G454 Transient global amnesia: Secondary | ICD-10-CM | POA: Diagnosis not present

## 2021-08-08 LAB — ECHOCARDIOGRAM COMPLETE BUBBLE STUDY
AR max vel: 3.19 cm2
AV Area VTI: 3.29 cm2
AV Area mean vel: 3.29 cm2
AV Mean grad: 5 mmHg
AV Peak grad: 9.2 mmHg
Ao pk vel: 1.52 m/s
Area-P 1/2: 2.39 cm2
MV VTI: 4.87 cm2
S' Lateral: 3.7 cm

## 2021-08-08 LAB — HEMOGLOBIN A1C
Hgb A1c MFr Bld: 5.6 % (ref 4.8–5.6)
Mean Plasma Glucose: 114.02 mg/dL

## 2021-08-08 LAB — LIPID PANEL
Cholesterol: 157 mg/dL (ref 0–200)
HDL: 68 mg/dL (ref >40)
LDL Cholesterol: 78 mg/dL (ref 0–99)
Total CHOL/HDL Ratio: 2.3 RATIO
Triglycerides: 53 mg/dL (ref <150)
VLDL: 11 mg/dL (ref 0–40)

## 2021-08-08 LAB — HIV ANTIBODY (ROUTINE TESTING W REFLEX): HIV Screen 4th Generation wRfx: NONREACTIVE

## 2021-08-08 MED ORDER — FLUTICASONE PROPIONATE 50 MCG/ACT NA SUSP: 2.0000 | Freq: Every day | NASAL | Status: AC | PRN

## 2021-08-08 MED ORDER — ZOLPIDEM TARTRATE 5 MG PO TABS: 2.5000 mg | ORAL_TABLET | Freq: Every evening | ORAL | Status: AC | PRN

## 2021-08-08 NOTE — Discharge Summary (Signed)
Physician Discharge Summary  ORLANDO DEVEREUX DHO:[id number] DOB: 21-Nov-1951 DOA: 08/07/2021  PCP: Mick Sell, MD  Admit date: 08/07/2021 Discharge date: 08/08/2021  Admitted From: Home Disposition: Home  Recommendations for Outpatient Follow-up:  Follow up with PCP in 1 week  Outpatient follow-up with neurology Follow up in ED if symptoms worsen or new appear   Home Health: No Equipment/Devices: None  Discharge Condition: Stable CODE STATUS: Full Diet recommendation: Heart healthy/carb modified  Brief/Interim Summary: 70 y.o. 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  On presentation, CT head showed asymmetric hypodensity involving the peripheral left MCA branch with acute intraluminal thrombus not excluded.  CTA head and neck were suboptimal but did not show  significant stenosis, dissection, occlusion or aneurysm.  However, there was incidental finding of linear filling defect in the thoracic aorta.  Teleneurology recommended stroke work-up.  MRI of the brain was negative for acute abnormality.  Symptoms have resolved.  Neurology has cleared the patient for discharge.  He has tolerated PT.  He will be discharged home today.  Discharge Diagnoses:   Possible TIA causing transient global amnesia/vision loss -CT head showed asymmetric hypodensity involving the peripheral left MCA branch with acute intraluminal thrombus not excluded.  CTA head and neck were suboptimal but did not show  significant stenosis, dissection, occlusion or aneurysm.  However, there was incidental finding of linear filling defect in the thoracic aorta.  Teleneurology recommended stroke work-up.  MRI of the brain was negative for acute abnormality.  Symptoms have  resolved.  Neurology has cleared the patient for discharge.  He has tolerated PT.  He will be discharged home today. -2D echo showed no intracardiac clot.  A1c 5.6.  LDL 78 -Continue Xarelto.  Outpatient follow-up with neurology  Hypertension -Continue metoprolol and amlodipine  Paroxysmal A. fib -Continue Xarelto and metoprolol  Diabetes mellitus type II -Continue home regimen.  A1c 5.6.  Carb modified diet  History of spontaneous thoracic aortic aneurysm -S/p repair in 9/121 -- had incidental finding of linear defect on CTA head and neck.  Per radiology, this appears to be chronic and similar to previous MRI in June.  Patient also does not have any chest pain to warrant further imaging at this time.  Outpatient follow-up with vascular surgery  Hyperlipidemia -History of statin intolerance.  Continue Zetia  History of psoriatic arthritis -Continue weekly methotrexate and monthly Cosentyx  History of BPH -Status post UroLift.  Continue silodosin  OSA -Continue CPAP next  Obesity -Outpatient follow-up   Discharge Instructions   Allergies as of 08/08/2021       Reactions   Atorvastatin Other (See Comments)   Leg cramps    Pravastatin Other (See Comments)   Leg cramps    Fluvastatin    Other reaction(s): Muscle Pain   Rosuvastatin Other (See Comments)        Medication List     STOP taking these medications    HYDROcodone-acetaminophen 5-325 MG tablet Commonly known as: NORCO/VICODIN       TAKE these medications    acetaminophen 500 MG tablet Commonly known as: TYLENOL Take 1,000 mg by mouth every 6 (six) hours as needed for moderate pain.   acetic acid-hydrocortisone OTIC solution Commonly known as: VOSOL-HC Place 1 drop into both ears daily as needed (irritation).  amLODipine 10 MG tablet Commonly known as: NORVASC Take 10 mg by mouth daily.   blood glucose meter kit and supplies Kit Dispense based on patient and insurance preference. Use  once daily as directed. (FOR ICD-10 E11.9).   cholecalciferol 1000 units tablet Commonly known as: VITAMIN D Take 1,000 Units by mouth daily.   COSENTYX [place] Inject 1 application into the skin every 30 (thirty) days.   ezetimibe 10 MG tablet Commonly known as: ZETIA Take 10 mg by mouth daily.   Fish Oil 1000 MG Caps Take 4,000 mg by mouth daily.   fluticasone 50 MCG/ACT nasal spray Commonly known as: FLONASE Place 2 sprays into both nostrils daily as needed for allergies.   folic acid 1 MG tablet Commonly known as: FOLVITE Take 1 mg by mouth daily.   latanoprost 0.005 % ophthalmic solution Commonly known as: XALATAN Place 1 drop into both eyes at bedtime.   metFORMIN 500 MG tablet Commonly known as: GLUCOPHAGE Take 500 mg by mouth daily.   methotrexate 2.5 MG tablet Commonly known as: RHEUMATREX Take 12.5 mg by mouth every Tuesday.   metoprolol tartrate 50 MG tablet Commonly known as: LOPRESSOR Take 50 mg by mouth in the morning and at bedtime.   multivitamin capsule Take 1 capsule by mouth daily.   naproxen sodium 220 MG tablet Commonly known as: ALEVE Take 220 mg by mouth 2 (two) times daily as needed (pain).   omeprazole 20 MG capsule Commonly known as: PRILOSEC TAKE 1 CAPSULE DAILY What changed:  how much to take how to take this when to take this additional instructions   polyethylene glycol powder 17 GM/SCOOP powder Commonly known as: GLYCOLAX/MIRALAX TAKE 17 G BY MOUTH DAILY AS NEEDED FOR MILD CONSTIPATION.   PRESERVISION AREDS 2 PO Take 1 capsule by mouth in the morning and at bedtime.   QC TUMERIC COMPLEX PO Take 2 capsules by mouth daily.   rivaroxaban 20 MG Tabs tablet Commonly known as: XARELTO Take 1 tablet by mouth at bedtime.   silodosin 8 MG Caps capsule Commonly known as: RAPAFLO Take 1 capsule (8 mg total) by mouth daily with breakfast.   SUPER B COMPLEX PO Take 1 tablet by mouth daily.   zolpidem 5 MG tablet Commonly known  as: AMBIEN Take 0.5 tablets (2.5 mg total) by mouth at bedtime as needed (sleep). What changed: how much to take         Follow-up Information     Leonel Ramsay, MD. Schedule an appointment as soon as possible for a visit in 1 week(s).   Specialty: Infectious Diseases Contact information: [place] [address] [phone]         Vladimir Crofts, MD. Schedule an appointment as soon as possible for a visit in 1 week(s).   Specialty: Neurology Contact information: [facility] [place] [address] [phone]                Allergies  Allergen Reactions   Atorvastatin Other (See Comments)    Leg cramps    Pravastatin Other (See Comments)    Leg cramps    Fluvastatin     Other reaction(s): Muscle Pain   Rosuvastatin Other (See Comments)    Consultations: Neurology   Procedures/Studies: CT ANGIO HEAD NECK W WO CM  Result Date: 08/07/2021 CLINICAL DATA:  Altered mental status, double vision in right eye EXAM: CT ANGIOGRAPHY HEAD AND NECK TECHNIQUE: Multidetector CT imaging of the  head and neck was performed using the standard protocol during bolus administration of intravenous contrast. Multiplanar CT image reconstructions and MIPs were obtained to evaluate the vascular anatomy. Carotid stenosis measurements (when applicable) are obtained utilizing NASCET criteria, using the distal internal carotid diameter as the denominator. CONTRAST:  30mL OMNIPAQUE IOHEXOL 350 MG/ML SOLN COMPARISON:  Same-day noncontrast CT head FINDINGS: CT HEAD FINDINGS Brain: There is no evidence of acute intracranial hemorrhage, extra-axial fluid collection, or acute infarct. The ventricles are stable in size. There is no midline shift. There is no mass lesion. Vascular: See below Skull: Normal. Negative for fracture or focal lesion. Sinuses: The imaged paranasal sinuses are clear. Orbits: Bilateral lens implants are in  place. The globes and orbits are otherwise unremarkable. Review of the MIP images confirms the above findings CTA NECK FINDINGS The vasculature is suboptimally assessed due to poor timing of the contrast bolus probably related to the aortic dissection. Additionally, there is significant venous reflux resulting in streak artifact throughout the neck. Aortic arch: There has been interval repair of the previously seen thoracic aortic aneurysm and dissection. Linear filling defects in the aorta may reflect dissection flaps or surgical material. This is incompletely evaluated on the current study. Right carotid system: No evidence of dissection, stenosis (50% or greater) or occlusion. Left carotid system: No evidence of dissection, stenosis (50% or greater) or occlusion. Vertebral arteries: The proximal vertebral arteries are not assessed due to significant venous contamination. The distal V2 and V3 segments are patent. There is no definite stenosis, dissection, or occlusion. Skeleton: There is mild multilevel degenerative change of the cervical spine. There is degenerative change of both temporomandibular joints. Other neck: The soft tissues are unremarkable. Upper chest: The lung apices are clear. Review of the MIP images confirms the above findings CTA HEAD FINDINGS Anterior circulation: There is calcification of the bilateral intracranial ICAs without hemodynamically significant stenosis or occlusion. The bilateral MCAs and ACAs are patent. There is no aneurysm. Posterior circulation: The V4 segments of the vertebral arteries are patent. The basilar artery is patent. The bilateral PCAs are patent. There is no significant stenosis or occlusion. There is no aneurysm. Venous sinuses: Not well assessed. Anatomic variants: None. Review of the MIP images confirms the above findings IMPRESSION: Suboptimal study due to poor contrast timing and significant venous contamination in the neck. 1. The proximal vertebral arteries  are not assessed due to significant venous contamination. The remaining vasculature of the head and neck is patent, with no significant stenosis, dissection, occlusion, or aneurysm. 2. No acute intracranial hemorrhage or infarct. 3. 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 Linear filling defects in the thoracic aorta may reflect dissection flaps or surgical material. This is incompletely evaluated on the current study. Consider dedicated CTA of the chest for better evaluation if indicated. Electronically Signed   By: Valetta Mole M.D.   On: 08/07/2021 18:40   CT HEAD WO CONTRAST  Result Date: 08/07/2021 CLINICAL DATA:  Delirium, double vision EXAM: CT HEAD WITHOUT CONTRAST TECHNIQUE: Contiguous axial images were obtained from the base of the skull through the vertex without intravenous contrast. COMPARISON:  None. FINDINGS: Brain: Normal anatomic configuration. Parenchymal volume loss is commensurate with the patient's age. Mild periventricular white matter changes are present likely reflecting the sequela of small vessel ischemia. No abnormal intra or extra-axial mass lesion or fluid collection. No abnormal mass effect or midline shift. No evidence of acute intracranial hemorrhage or infarct. Ventricular size is normal. Cerebellum unremarkable.  Vascular: There is asymmetric hyperdensity involving the left M2 and M3 branches within the left sylvian fissure, best seen on axial image # 15-17/2. Acute intraluminal thrombus is not excluded. Skull: Intact Sinuses/Orbits: Paranasal sinuses are clear. Orbits are unremarkable. Other: Mastoid air cells and middle ear cavities are clear. IMPRESSION: Asymmetric hyperdensity involving the peripheral left MCA branches. Acute intraluminal thrombus is not excluded and correlation with clinical examination is recommended. If abnormal, CT arteriography would be helpful for further evaluation. No evidence of acute cortical infarct at this time. Attempts are  being made to contact the patient is managing clinician for direct verbal communication of these findings at this time Electronically Signed   By: Fidela Salisbury M.D.   On: 08/07/2021 14:43   MR BRAIN WO CONTRAST  Result Date: 08/07/2021 CLINICAL DATA:  Transient ischemic attack.  Right eye diplopia. EXAM: MRI HEAD WITHOUT CONTRAST TECHNIQUE: Multiplanar, multiecho pulse sequences of the brain and surrounding structures were obtained without intravenous contrast. COMPARISON:  None. FINDINGS: Brain: No acute infarct, mass effect or extra-axial collection. Fewer than 5 scattered microhemorrhages in a nonspecific pattern. There is multifocal hyperintense T2-weighted signal within the white matter. Generalized volume loss without a clear lobar predilection. The midline structures are normal. Vascular: Major flow voids are preserved. Skull and upper cervical spine: Normal calvarium and skull base. Visualized upper cervical spine and soft tissues are normal. Sinuses/Orbits:No paranasal sinus fluid levels or advanced mucosal thickening. No mastoid or middle ear effusion. Normal orbits. IMPRESSION: 1. No acute intracranial abnormality. 2. 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 Electronically Signed   By: Ulyses Jarred M.D.   On: 08/07/2021 23:01      Subjective: Patient seen and examined at bedside.  His symptoms have resolved.  No weakness, nausea, vomiting, headache or blurred vision currently  Discharge Exam: Vitals:   08/08/21 0317 08/08/21 0734  BP: (!) 104/58 120/84  Pulse: (!) 58 70  Resp: 18 18  Temp: 97.8 F (36.6 C) 98.2 F (36.8 C)  SpO2: 99% 98%    General: Pt is alert, awake, not in acute distress.  Currently on room air Cardiovascular: Intermittently bradycardic, S1/S2 + Respiratory: bilateral decreased breath sounds at bases Abdominal: Soft, NT, ND, bowel sounds + Extremities: no edema, no cyanosis    The results of significant diagnostics from  this hospitalization (including imaging, microbiology, ancillary and laboratory) are listed below for reference.     Microbiology: Recent Results (from the past 240 hour(s))  Resp Panel by RT-PCR (Flu A&B, Covid) Nasopharyngeal Swab     Status: None   Collection Time: 08/07/21  5:36 PM   Specimen: Nasopharyngeal Swab; Nasopharyngeal(NP) swabs in vial transport medium  Result Value Ref Range Status   SARS Coronavirus 2 by RT PCR NEGATIVE NEGATIVE Final    Comment: (NOTE) SARS-CoV-2 target nucleic acids are NOT DETECTED.  The SARS-CoV-2 RNA is generally detectable in upper respiratory specimens during the acute phase of infection. The lowest concentration of SARS-CoV-2 viral copies this assay can detect is 138 copies/mL. A negative result does not preclude SARS-Cov-2 infection and should not be used as the sole basis for treatment or other patient management decisions. A negative result may occur with  improper specimen collection/handling, submission of specimen other than nasopharyngeal swab, presence of viral mutation(s) within the areas targeted by this assay, and inadequate number of viral copies(<138 copies/mL). A negative result must be combined with clinical observations, patient history, and epidemiological information. The expected result is  Negative.  Fact Sheet for Patients:  EntrepreneurPulse.com.au  Fact Sheet for Healthcare Providers:  IncredibleEmployment.be  This test is no t yet approved or cleared by the [place] FDA and  has been authorized for detection and/or diagnosis of SARS-CoV-2 by FDA under an Emergency Use Authorization (EUA). This EUA will remain  in effect (meaning this test can be used) for the duration of the COVID-19 declaration under Section 564(b)(1) of the Act, 21 U.S.C.section 360bbb-3(b)(1), unless the authorization is terminated  or revoked sooner.       Influenza A by PCR NEGATIVE NEGATIVE Final    Influenza B by PCR NEGATIVE NEGATIVE Final    Comment: (NOTE) The Xpert Xpress SARS-CoV-2/FLU/RSV plus assay is intended as an aid in the diagnosis of influenza from Nasopharyngeal swab specimens and should not be used as a sole basis for treatment. Nasal washings and aspirates are unacceptable for Xpert Xpress SARS-CoV-2/FLU/RSV testing.  Fact Sheet for Patients: EntrepreneurPulse.com.au  Fact Sheet for Healthcare Providers: IncredibleEmployment.be  This test is not yet approved or cleared by the [place] FDA and has been authorized for detection and/or diagnosis of SARS-CoV-2 by FDA under an Emergency Use Authorization (EUA). This EUA will remain in effect (meaning this test can be used) for the duration of the COVID-19 declaration under Section 564(b)(1) of the Act, 21 U.S.C. section 360bbb-3(b)(1), unless the authorization is terminated or revoked.  Performed at [facility], [address] [place],  [address]      Labs: BNP (last 3 results) No results for input(s): BNP in the last 8760 hours. Basic Metabolic Panel: Recent Labs  Lab 08/07/21 1322  NA 135  K 4.1  CL 100  CO2 23  GLUCOSE 121*  BUN 19  CREATININE 0.98  CALCIUM 9.7   Liver Function Tests: Recent Labs  Lab 08/07/21 1322  AST 24  ALT 20  ALKPHOS 70  BILITOT 1.0  PROT 7.6  ALBUMIN 4.2   No results for input(s): LIPASE, AMYLASE in the last 168 hours. No results for input(s): AMMONIA in the last 168 hours. CBC: Recent Labs  Lab 08/07/21 1322 08/07/21 1736  WBC 8.7 9.5  NEUTROABS 5.1 5.7  HGB 17.3* 16.5  HCT 48.0 45.4  MCV 89.6 90.6  PLT 197 169   Cardiac Enzymes: No results for input(s): CKTOTAL, CKMB, CKMBINDEX, TROPONINI in the last 168 hours. BNP: Invalid input(s): POCBNP CBG: No results for input(s): GLUCAP in the last 168 hours. D-Dimer No results for input(s): DDIMER in the last 72 hours. Hgb A1c Recent Labs     08/08/21 0417  HGBA1C 5.6   Lipid Profile Recent Labs    08/08/21 0417  CHOL 157  HDL 68  LDLCALC 78  TRIG 53  CHOLHDL 2.3   Thyroid function studies No results for input(s): TSH, T4TOTAL, T3FREE, THYROIDAB in the last 72 hours.  Invalid input(s): FREET3 Anemia work up No results for input(s): VITAMINB12, FOLATE, FERRITIN, TIBC, IRON, RETICCTPCT in the last 72 hours. Urinalysis    Component Value Date/Time   COLORURINE YELLOW (A) 08/07/2021 1736   APPEARANCEUR CLEAR (A) 08/07/2021 1736   APPEARANCEUR Hazy (A) 04/04/2021 0847   LABSPEC 1.023 08/07/2021 1736   PHURINE 9.0 (H) 08/07/2021 1736   GLUCOSEU NEGATIVE 08/07/2021 1736   HGBUR NEGATIVE 08/07/2021 1736   BILIRUBINUR NEGATIVE 08/07/2021 1736   BILIRUBINUR Negative 04/04/2021 [place] 08/07/2021 1736   PROTEINUR NEGATIVE 08/07/2021 1736   NITRITE NEGATIVE 08/07/2021 1736   LEUKOCYTESUR NEGATIVE 08/07/2021  59   Sepsis Labs Invalid input(s): PROCALCITONIN,  WBC,  LACTICIDVEN Microbiology Recent Results (from the past 240 hour(s))  Resp Panel by RT-PCR (Flu A&B, Covid) Nasopharyngeal Swab     Status: None   Collection Time: 08/07/21  5:36 PM   Specimen: Nasopharyngeal Swab; Nasopharyngeal(NP) swabs in vial transport medium  Result Value Ref Range Status   SARS Coronavirus 2 by RT PCR NEGATIVE NEGATIVE Final    Comment: (NOTE) SARS-CoV-2 target nucleic acids are NOT DETECTED.  The SARS-CoV-2 RNA is generally detectable in upper respiratory specimens during the acute phase of infection. The lowest concentration of SARS-CoV-2 viral copies this assay can detect is 138 copies/mL. A negative result does not preclude SARS-Cov-2 infection and should not be used as the sole basis for treatment or other patient management decisions. A negative result may occur with  improper specimen collection/handling, submission of specimen other than nasopharyngeal swab, presence of viral mutation(s) within  the areas targeted by this assay, and inadequate number of viral copies(<138 copies/mL). A negative result must be combined with clinical observations, patient history, and epidemiological information. The expected result is Negative.  Fact Sheet for Patients:  EntrepreneurPulse.com.au  Fact Sheet for Healthcare Providers:  IncredibleEmployment.be  This test is no t yet approved or cleared by the [place] FDA and  has been authorized for detection and/or diagnosis of SARS-CoV-2 by FDA under an Emergency Use Authorization (EUA). This EUA will remain  in effect (meaning this test can be used) for the duration of the COVID-19 declaration under Section 564(b)(1) of the Act, 21 U.S.C.section 360bbb-3(b)(1), unless the authorization is terminated  or revoked sooner.       Influenza A by PCR NEGATIVE NEGATIVE Final   Influenza B by PCR NEGATIVE NEGATIVE Final    Comment: (NOTE) The Xpert Xpress SARS-CoV-2/FLU/RSV plus assay is intended as an aid in the diagnosis of influenza from Nasopharyngeal swab specimens and should not be used as a sole basis for treatment. Nasal washings and aspirates are unacceptable for Xpert Xpress SARS-CoV-2/FLU/RSV testing.  Fact Sheet for Patients: EntrepreneurPulse.com.au  Fact Sheet for Healthcare Providers: IncredibleEmployment.be  This test is not yet approved or cleared by the [place] FDA and has been authorized for detection and/or diagnosis of SARS-CoV-2 by FDA under an Emergency Use Authorization (EUA). This EUA will remain in effect (meaning this test can be used) for the duration of the COVID-19 declaration under Section 564(b)(1) of the Act, 21 U.S.C. section 360bbb-3(b)(1), unless the authorization is terminated or revoked.  Performed at [facility], [address], [place], [place] [address]      Time coordinating discharge: 35  minutes  SIGNED:   Aline August, MD  [facility] 08/08/2021, 10:48 AM

## 2021-08-08 NOTE — Progress Notes (Signed)
SLP Cancellation Note  Patient Details Name: Wayne Scott MRN: [id number] DOB: 1951-10-24   Cancelled treatment:       Reason Eval/Treat Not Completed: SLP screened, no needs identified, will sign off (chart reviewed; consulted pt and Wife in room, then [place]). Pt denied any difficulty swallowing and is currently on a regular diet; tolerates swallowing pills w/ water per NSG. Pt conversed in conversation w/out overt expressive/receptive deficits noted; pt denied any speech-language deficits. Speech clear. Pt denied any difficulty texting/communicating/reading via phone. He denied any difficulty w/ recent memory and was able to recall all events since admission yesterday. He briefly highlighted questions he had for his MDs. No further skilled ST services indicated as pt appears at his baseline. Pt agreed. NSG to reconsult if any change in status while admitted.       Orinda Kenner, MS, CCC-SLP Speech Language Pathologist Rehab Services [phone] [facility] 08/08/2021, 11:16 AM

## 2021-08-08 NOTE — Evaluation (Addendum)
Physical Therapy Evaluation Patient Details Name: Wayne Scott MRN: [id number] DOB: September 11, 1951 Today's Date: 08/08/2021  History of Present Illness  Patient is a 70 year old male who with presented with transient diplopia accompanied by transient global amnesia. MRI of brain reports no acute intracranial abnormality. Stroke work up ongoing. History of numerous episodes of transient global amnesia and transient vision loss in the past with recent diagnosis of paroxysmal atrial fibrillation.   Clinical Impression  Patient agreeable to PT evaluation. Patient is usually independent with mobility at baseline and reports episodes like this in the past. Patient feels he is currently back to his baseline level of mobility.   Patient is independent with all activity during evaluation. He ambulated 522f in hallway with normal gait speed without assistive device. High level balance activity completed including tandem walking, walking backwards, head turns, reaching outside base of support during functional standing activity, navigating obstacles in hallway all without loss of balance. Heart rate in the 80's with standing activity. Patient able to read signs in hallway without difficulty and no blurry vision is reported with functional activity.   No further acute PT needs at this time. Patient appears to be at his baseline level of functional mobility.      Recommendations for follow up therapy are one component of a multi-disciplinary discharge planning process, led by the attending physician.  Recommendations may be updated based on patient status, additional functional criteria and insurance authorization.  Follow Up Recommendations No PT follow up    Equipment Recommendations  None recommended by PT    Recommendations for Other Services       Precautions / Restrictions Precautions Precautions: Fall Restrictions Weight Bearing Restrictions: No      Mobility  Bed Mobility Overal bed  mobility: Independent                  Transfers Overall transfer level: Independent                  Ambulation/Gait Ambulation/Gait assistance: Independent Gait Distance (Feet): 500 Feet Assistive device: None Gait Pattern/deviations: Step-through pattern;WFL(Within Functional Limits) Gait velocity: normal walking speed   General Gait Details: no loss of balance while navigating obstacles in hallway without loss of balance. heart rate in the 80's with upright activity.  Stairs            Wheelchair Mobility    Modified Rankin (Stroke Patients Only)       Balance Overall balance assessment: Independent                           High level balance activites: Sudden stops;Head turns;Turns;Direction changes;Backward walking (tandem walking) High Level Balance Comments: no loss of balance with high level balance activities listed above as well as external perturbations in all directions, reacing outside base of support during dynamic activity             Pertinent Vitals/Pain Pain Assessment: Faces Faces Pain Scale: Hurts a little bit Pain Location: headache Pain Descriptors / Indicators: Headache Pain Intervention(s): Limited activity within patient's tolerance    Home Living Family/patient expects to be discharged to:: Private residence Living Arrangements: Spouse/significant other Available Help at Discharge: Family Type of Home: House Home Access: Level entry     Home Layout: One level        Prior Function Level of Independence: Independent  Hand Dominance        Extremity/Trunk Assessment   Upper Extremity Assessment Upper Extremity Assessment: Overall WFL for tasks assessed    Lower Extremity Assessment Lower Extremity Assessment: Overall WFL for tasks assessed       Communication   Communication: No difficulties  Cognition Arousal/Alertness: Awake/alert Behavior During Therapy: WFL for  tasks assessed/performed Overall Cognitive Status: Within Functional Limits for tasks assessed                                 General Comments: patient is alert and oriented x 4. patient is able to follow multi-step commands without difficulty.      General Comments General comments (skin integrity, edema, etc.): patient reports vision is back to normal. he is able to read signs in hallway during functional activity without difficulty    Exercises     Assessment/Plan    PT Assessment Patent does not need any further PT services  PT Problem List         PT Treatment Interventions      PT Goals (Current goals can be found in the Care Plan section)  Acute Rehab PT Goals Patient Stated Goal: to go home today if possible PT Goal Formulation: With patient Time For Goal Achievement: 08/08/21 Potential to Achieve Goals: Good    Frequency     Barriers to discharge        Co-evaluation               AM-PAC PT "6 Clicks" Mobility  Outcome Measure Help needed turning from your back to your side while in a flat bed without using bedrails?: None Help needed moving from lying on your back to sitting on the side of a flat bed without using bedrails?: None Help needed moving to and from a bed to a chair (including a wheelchair)?: None Help needed standing up from a chair using your arms (e.g., wheelchair or bedside chair)?: None Help needed to walk in hospital room?: None Help needed climbing 3-5 steps with a railing? : None 6 Click Score: 24    End of Session         PT Visit Diagnosis: Other symptoms and signs involving the nervous system (R29.898)    Time: [id number] PT Time Calculation (min) (ACUTE ONLY): 29 min   Charges:   PT Evaluation $PT Eval Moderate Complexity: 1 Mod PT Treatments $Neuromuscular Re-education: 8-22 mins        Minna Merritts, PT, MPT   Percell Locus 08/08/2021, 9:56 AM

## 2021-08-08 NOTE — Progress Notes (Signed)
OT Cancellation Note  Patient Details Name: Wayne Scott MRN: [id number] DOB: 10-01-1951   Cancelled Treatment:    Reason Eval/Treat Not Completed: OT screened, no needs identified, will sign off. Upon arrival to room, pt finishing PT evaluation and reporting return to baseline functional independence. No skilled OT needs identified at this time. Will sign off. Please re-consult if additional needs arise.    Fredirick Maudlin, OTR/L [place]

## 2021-08-08 NOTE — Plan of Care (Signed)

## 2021-08-08 NOTE — Progress Notes (Signed)
*  PRELIMINARY RESULTS* Echocardiogram 2D Echocardiogram has been performed.  Wayne Scott Wayne Scott 08/08/2021, 10:40 AM

## 2021-08-08 NOTE — Progress Notes (Signed)
Discharge instructions explained to pt and wife/both verbalized understanding. IV and tele removed. Will transport off into via wheelchair.

## 2021-08-14 DIAGNOSIS — I10 Essential (primary) hypertension: Secondary | ICD-10-CM | POA: Diagnosis not present

## 2021-08-14 DIAGNOSIS — I4891 Unspecified atrial fibrillation: Secondary | ICD-10-CM | POA: Diagnosis not present

## 2021-08-14 DIAGNOSIS — G4733 Obstructive sleep apnea (adult) (pediatric): Secondary | ICD-10-CM | POA: Diagnosis not present

## 2021-08-14 DIAGNOSIS — E785 Hyperlipidemia, unspecified: Secondary | ICD-10-CM | POA: Diagnosis not present

## 2021-08-22 DIAGNOSIS — H353211 Exudative age-related macular degeneration, right eye, with active choroidal neovascularization: Secondary | ICD-10-CM | POA: Diagnosis not present

## 2021-08-25 DIAGNOSIS — H6063 Unspecified chronic otitis externa, bilateral: Secondary | ICD-10-CM | POA: Diagnosis not present

## 2021-08-25 DIAGNOSIS — H903 Sensorineural hearing loss, bilateral: Secondary | ICD-10-CM | POA: Diagnosis not present

## 2021-08-28 DIAGNOSIS — L4050 Arthropathic psoriasis, unspecified: Secondary | ICD-10-CM | POA: Diagnosis not present

## 2021-08-28 DIAGNOSIS — L409 Psoriasis, unspecified: Secondary | ICD-10-CM | POA: Diagnosis not present

## 2021-08-28 DIAGNOSIS — Z79631 Long term (current) use of antimetabolite agent: Secondary | ICD-10-CM | POA: Diagnosis not present

## 2021-09-14 ENCOUNTER — Encounter: Payer: Self-pay | Admitting: *Deleted

## 2021-09-19 DIAGNOSIS — H353211 Exudative age-related macular degeneration, right eye, with active choroidal neovascularization: Secondary | ICD-10-CM | POA: Diagnosis not present

## 2021-09-26 DIAGNOSIS — Z952 Presence of prosthetic heart valve: Secondary | ICD-10-CM | POA: Diagnosis not present

## 2021-09-26 DIAGNOSIS — I71019 Dissection of thoracic aorta, unspecified: Secondary | ICD-10-CM | POA: Diagnosis not present

## 2021-09-26 DIAGNOSIS — Z8679 Personal history of other diseases of the circulatory system: Secondary | ICD-10-CM | POA: Diagnosis not present

## 2021-09-26 DIAGNOSIS — Z9889 Other specified postprocedural states: Secondary | ICD-10-CM | POA: Diagnosis not present

## 2021-09-26 DIAGNOSIS — I7100 Dissection of unspecified site of aorta: Secondary | ICD-10-CM | POA: Diagnosis not present

## 2021-10-09 DIAGNOSIS — I480 Paroxysmal atrial fibrillation: Secondary | ICD-10-CM | POA: Diagnosis not present

## 2021-10-10 DIAGNOSIS — Z23 Encounter for immunization: Secondary | ICD-10-CM | POA: Diagnosis not present

## 2021-10-24 DIAGNOSIS — H353222 Exudative age-related macular degeneration, left eye, with inactive choroidal neovascularization: Secondary | ICD-10-CM | POA: Diagnosis not present

## 2021-10-24 DIAGNOSIS — H353211 Exudative age-related macular degeneration, right eye, with active choroidal neovascularization: Secondary | ICD-10-CM | POA: Diagnosis not present

## 2021-11-12 ENCOUNTER — Encounter: Payer: Self-pay | Admitting: Urology

## 2021-11-12 DIAGNOSIS — N411 Chronic prostatitis: Secondary | ICD-10-CM

## 2021-11-13 MED ORDER — SILODOSIN 8 MG PO CAPS: 8.0000 mg | ORAL_CAPSULE | Freq: Every day | ORAL | 1 refills | Status: DC

## 2021-11-15 ENCOUNTER — Encounter: Payer: Self-pay | Admitting: Urology

## 2021-11-16 ENCOUNTER — Ambulatory Visit (INDEPENDENT_AMBULATORY_CARE_PROVIDER_SITE_OTHER): Payer: Medicare Other | Admitting: Physician Assistant

## 2021-11-16 ENCOUNTER — Other Ambulatory Visit: Payer: Self-pay

## 2021-11-16 ENCOUNTER — Encounter: Payer: Self-pay | Admitting: Physician Assistant

## 2021-11-16 VITALS — BP 163/67 | HR 72 | Ht 72.0 in | Wt 273.0 lb

## 2021-11-16 DIAGNOSIS — N411 Chronic prostatitis: Secondary | ICD-10-CM | POA: Diagnosis not present

## 2021-11-16 DIAGNOSIS — N390 Urinary tract infection, site not specified: Secondary | ICD-10-CM | POA: Diagnosis not present

## 2021-11-16 LAB — BLADDER SCAN AMB NON-IMAGING

## 2021-11-16 MED ORDER — URIBEL 118 MG PO CAPS: 1.0000 | ORAL_CAPSULE | Freq: Four times a day (QID) | ORAL | 3 refills | Status: AC | PRN

## 2021-11-16 MED ORDER — MIRABEGRON ER 50 MG PO TB24: 50.0000 mg | ORAL_TABLET | Freq: Every day | ORAL | 0 refills | Status: DC

## 2021-11-16 NOTE — Patient Instructions (Signed)
Continue Rapaflo (silodosin). Start Myrbetriq 50mg  daily samples. Start Uribel as needed, remember to use a GoodRx coupon to keep your cost down at the pharmacy. I'll send your urine for culture today and will call you if it's positive and I need to start you on antibiotics. The physical therapy department will contact you to schedule pelvic floor PT.

## 2021-11-16 NOTE — Progress Notes (Signed)
11/16/2021 4:19 PM   [name] 03-28-1951 [id number]  CC: Chief Complaint  Patient presents with   Recurrent UTI   HPI: Wayne Scott is a 70 y.o. male with PMH BPH with LUTS s/p UroLift, prostate nodule with normal PSA, nephrolithiasis, ED s/p IPP placement, and chronic prostatitis who presents today for valuation of possible UTI.   Today he reports an approximate 1 to 2-week history of increased urinary frequency and urgency with nocturia x4 consistent with past prostatitis flares.  He has been on Rapaflo continuously.  In-office UA today positive for trace ketones and trace protein; urine microscopy pan negative. PVR 63mL.  PMH: Past Medical History:  Diagnosis Date   Allergic rhinitis    Anxiety    Aortic aneurysm and dissection (HCC) 07/2020   Arthritis    Diabetes mellitus without complication (HCC)    GERD (gastroesophageal reflux disease)    Headache    Heart murmur    History of kidney stones    HLD (hyperlipidemia)    Hypertension    Macular degeneration    OSA on CPAP    Paroxysmal atrial fibrillation (HCC)    Psoriasis     Surgical History: Past Surgical History:  Procedure Laterality Date   ASCENDING AORTA GRAFT WITH CARDIOPULMONARY BYPASS; VALVE SUSPENSION WITH CORONARY RECONSTRUCTION AND VALVE-SPARING AORTIC ROOT REMODELING (DAVID PROCEDURE, YACOUB PROCEDURE)  08/11/2020   performed at [facility]   CATARACT EXTRACTION     COLONOSCOPY N/A 07/13/2021   Procedure: COLONOSCOPY;  Surgeon: Jaynie Collins, DO;  Location: [facility] ENDOSCOPY;  Service: Gastroenterology;  Laterality: N/A;   CYSTOSCOPY WITH INSERTION OF UROLIFT N/A 03/13/2021   Procedure: CYSTOSCOPY WITH INSERTION OF UROLIFT;  Surgeon: Vanna Scotland, MD;  Location: [facility] ORS;  Service: Urology;  Laterality: N/A;   EXTRACORPOREAL SHOCK WAVE LITHOTRIPSY Left 11/14/2017   Procedure: EXTRACORPOREAL SHOCK WAVE LITHOTRIPSY (ESWL);  Surgeon: Vanna Scotland, MD;  Location: [facility] ORS;   Service: Urology;  Laterality: Left;   EYE SURGERY     NASAL SINUS SURGERY  1991   PENILE PROSTHESIS IMPLANT  02/2020   TONSILLECTOMY     VASECTOMY      Home Medications:  Allergies as of 11/16/2021       Reactions   Atorvastatin Other (See Comments)   Leg cramps    Pravastatin Other (See Comments)   Leg cramps    Fluvastatin    Other reaction(s): Muscle Pain   Rosuvastatin Other (See Comments)        Medication List        Accurate as of November 16, 2021  4:19 PM. If you have any questions, ask your nurse or doctor.          acetaminophen 500 MG tablet Commonly known as: TYLENOL Take 1,000 mg by mouth every 6 (six) hours as needed for moderate pain.   acetic acid-hydrocortisone OTIC solution Commonly known as: VOSOL-HC Place 1 drop into both ears daily as needed (irritation).   amLODipine 10 MG tablet Commonly known as: NORVASC Take 10 mg by mouth daily.   blood glucose meter kit and supplies Kit Dispense based on patient and insurance preference. Use once daily as directed. (FOR ICD-10 E11.9).   cholecalciferol 1000 units tablet Commonly known as: VITAMIN D Take 1,000 Units by mouth daily.   COSENTYX [place] Inject 1 application into the skin every 30 (thirty) days.   ezetimibe 10 MG tablet Commonly known as: ZETIA Take 10 mg by mouth daily.   Fish  Oil 1000 MG Caps Take 4,000 mg by mouth daily.   fluticasone 50 MCG/ACT nasal spray Commonly known as: FLONASE Place 2 sprays into both nostrils daily as needed for allergies.   folic acid 1 MG tablet Commonly known as: FOLVITE Take 1 mg by mouth daily.   latanoprost 0.005 % ophthalmic solution Commonly known as: XALATAN Place 1 drop into both eyes at bedtime.   metFORMIN 500 MG tablet Commonly known as: GLUCOPHAGE Take 500 mg by mouth daily.   methotrexate 2.5 MG tablet Commonly known as: RHEUMATREX Take 12.5 mg by mouth every Tuesday.   metoprolol tartrate 50 MG tablet Commonly known as:  LOPRESSOR Take 50 mg by mouth in the morning and at bedtime.   mirabegron ER 50 MG Tb24 tablet Commonly known as: MYRBETRIQ Take 1 tablet (50 mg total) by mouth daily. Started by: Debroah Loop, PA-C   multivitamin capsule Take 1 capsule by mouth daily.   naproxen sodium 220 MG tablet Commonly known as: ALEVE Take 220 mg by mouth 2 (two) times daily as needed (pain).   omeprazole 20 MG capsule Commonly known as: PRILOSEC TAKE 1 CAPSULE DAILY What changed:  how much to take how to take this when to take this additional instructions   polyethylene glycol powder 17 GM/SCOOP powder Commonly known as: GLYCOLAX/MIRALAX TAKE 17 G BY MOUTH DAILY AS NEEDED FOR MILD CONSTIPATION.   PRESERVISION AREDS 2 PO Take 1 capsule by mouth in the morning and at bedtime.   QC TUMERIC COMPLEX PO Take 2 capsules by mouth daily.   rivaroxaban 20 MG Tabs tablet Commonly known as: XARELTO Take 1 tablet by mouth at bedtime.   silodosin 8 MG Caps capsule Commonly known as: RAPAFLO Take 1 capsule (8 mg total) by mouth daily with breakfast.   SUPER B COMPLEX PO Take 1 tablet by mouth daily.   Uribel 118 MG Caps Take 1 capsule (118 mg total) by mouth 4 (four) times daily as needed. Started by: Debroah Loop, PA-C   zolpidem 5 MG tablet Commonly known as: AMBIEN Take 0.5 tablets (2.5 mg total) by mouth at bedtime as needed (sleep).        Allergies:  Allergies  Allergen Reactions   Atorvastatin Other (See Comments)    Leg cramps    Pravastatin Other (See Comments)    Leg cramps    Fluvastatin     Other reaction(s): Muscle Pain   Rosuvastatin Other (See Comments)    Family History: Family History  Problem Relation Age of Onset   Neuropathy Mother    Stroke Mother    Macular degeneration Mother    Diabetes Father    Arthritis Other        Parent   Hypertension Other        Parent   Diabetes Other        Parent   Heart attack Brother    Kidney cancer  Brother    Bladder Cancer Neg Hx    Prostate cancer Neg Hx     Social History:   reports that he has never smoked. He has never used smokeless tobacco. He reports current alcohol use of about 2.0 standard drinks per week. He reports that he does not use drugs.  Physical Exam: BP (!) 163/67    Pulse 72    Ht 6' (1.829 m)    Wt 273 lb (123.8 kg)    BMI 37.03 kg/m   Constitutional:  Alert and oriented, no acute distress, nontoxic appearing  HEENT: [place], AT Cardiovascular: No clubbing, cyanosis, or edema Respiratory: Normal respiratory effort, no increased work of breathing Skin: No rashes, bruises or suspicious lesions Neurologic: Grossly intact, no focal deficits, moving all 4 extremities Psychiatric: Normal mood and affect  Laboratory Data: Results for orders placed or performed in visit on 11/16/21  CULTURE, URINE COMPREHENSIVE   Specimen: Urine   UR  Result Value Ref Range   Urine Culture, Comprehensive Final report    Organism ID, Bacteria Comment   Microscopic Examination   Urine  Result Value Ref Range   WBC, UA 0-5 0 - 5 /hpf   RBC 0-2 0 - 2 /hpf   Epithelial Cells (non renal) None seen 0 - 10 /hpf   Casts Present (A) None seen /lpf   Cast Type Hyaline casts N/A   Mucus, UA Present (A) Not Estab.   Bacteria, UA None seen None seen/Few  Urinalysis, Complete  Result Value Ref Range   Specific Gravity, UA 1.015 1.005 - 1.030   pH, UA 5.5 5.0 - 7.5   Color, UA Yellow Yellow   Appearance Ur Clear Clear   Leukocytes,UA Negative Negative   Protein,UA Trace (A) Negative/Trace   Glucose, UA Negative Negative   Ketones, UA Trace (A) Negative   RBC, UA Negative Negative   Bilirubin, UA Negative Negative   Urobilinogen, Ur 0.2 0.2 - 1.0 mg/dL   Nitrite, UA Negative Negative   Microscopic Examination See below:   Bladder Scan (Post Void Residual) in office  Result Value Ref Range   Scan Result 78mL    Assessment & Plan:   1. Chronic prostatitis UA today is bland and he  is emptying appropriately.  Symptoms consistent with past prostatitis flares.  We discussed that this is most likely an inflammatory process, though we will send urine for culture today to rule out underlying infection and treat as indicated.  Patient expressed understanding.  Continue Rapaflo.  I previously had him on oxybutynin, however it does not appear that this helped much.  I gave him 4 weeks of Myrbetriq 50 mg samples today to try as an alternative.  He has also had some relief with Uribel in the past, we will refill this for him today as well.  Lastly, I encouraged him to pursue pelvic floor physical therapy given his frequency of flares and he is open to this, referral placed today. - Urinalysis, Complete - Bladder Scan (Post Void Residual) in office - CULTURE, URINE COMPREHENSIVE - mirabegron ER (MYRBETRIQ) 50 MG TB24 tablet; Take 1 tablet (50 mg total) by mouth daily.  Dispense: 28 tablet; Refill: 0 - Meth-Hyo-M Bl-Na Phos-Ph Sal (URIBEL) 118 MG CAPS; Take 1 capsule (118 mg total) by mouth 4 (four) times daily as needed.  Dispense: 30 capsule; Refill: 3 - Ambulatory referral to Physical Therapy  Return if symptoms worsen or fail to improve.  Debroah Loop, PA-C  [facility] [address] [place], [place] [address] [phone]

## 2021-11-20 LAB — CULTURE, URINE COMPREHENSIVE

## 2021-11-21 ENCOUNTER — Encounter: Payer: Self-pay | Admitting: *Deleted

## 2021-11-21 LAB — URINALYSIS, COMPLETE
Bilirubin, UA: NEGATIVE
Glucose, UA: NEGATIVE
Leukocytes,UA: NEGATIVE
Nitrite, UA: NEGATIVE
RBC, UA: NEGATIVE
Specific Gravity, UA: 1.015 (ref 1.005–1.030)
Urobilinogen, Ur: 0.2 mg/dL (ref 0.2–1.0)
pH, UA: 5.5 (ref 5.0–7.5)

## 2021-11-21 LAB — MICROSCOPIC EXAMINATION
Bacteria, UA: NONE SEEN
Epithelial Cells (non renal): NONE SEEN /hpf (ref 0–10)

## 2022-04-30 DIAGNOSIS — N411 Chronic prostatitis: Secondary | ICD-10-CM

## 2022-04-30 MED ORDER — SILODOSIN 8 MG PO CAPS: 8.0000 mg | ORAL_CAPSULE | Freq: Every day | ORAL | 3 refills | Status: DC

## 2022-07-20 ENCOUNTER — Other Ambulatory Visit: Payer: Self-pay

## 2022-07-20 DIAGNOSIS — N401 Enlarged prostate with lower urinary tract symptoms: Secondary | ICD-10-CM

## 2022-07-24 ENCOUNTER — Other Ambulatory Visit: Payer: Medicare Other

## 2022-07-24 DIAGNOSIS — N401 Enlarged prostate with lower urinary tract symptoms: Secondary | ICD-10-CM

## 2022-07-24 DIAGNOSIS — N138 Other obstructive and reflux uropathy: Secondary | ICD-10-CM

## 2022-07-25 LAB — PSA: Prostate Specific Ag, Serum: 0.2 ng/mL (ref 0.0–4.0)

## 2022-08-01 ENCOUNTER — Encounter: Payer: Self-pay | Admitting: Urology

## 2022-08-01 ENCOUNTER — Ambulatory Visit (INDEPENDENT_AMBULATORY_CARE_PROVIDER_SITE_OTHER): Payer: Medicare Other | Admitting: Urology

## 2022-08-01 VITALS — BP 155/75 | HR 69 | Ht 72.0 in | Wt 270.0 lb

## 2022-08-01 DIAGNOSIS — N401 Enlarged prostate with lower urinary tract symptoms: Secondary | ICD-10-CM | POA: Diagnosis not present

## 2022-08-01 DIAGNOSIS — N138 Other obstructive and reflux uropathy: Secondary | ICD-10-CM

## 2022-08-01 DIAGNOSIS — N411 Chronic prostatitis: Secondary | ICD-10-CM

## 2022-08-01 LAB — BLADDER SCAN AMB NON-IMAGING

## 2022-08-01 MED ORDER — SILODOSIN 8 MG PO CAPS: 8.0000 mg | ORAL_CAPSULE | Freq: Every day | ORAL | 3 refills | Status: DC

## 2022-08-01 NOTE — Progress Notes (Signed)
08/01/2022 3:05 PM   Wayne Scott 06-04-1951 [id number]  Referring provider: Leonel Ramsay, MD [place],  [place] [address]  Chief Complaint  Patient presents with   Benign Prostatic Hypertrophy    HPI: 71 year old with a history of BPH and irritative urinary symptoms/chronic prostatitis who returns for 1 year follow-up.  He underwent UroLift in 02/2021.  He did report some improvement in his obstructive urinary symptoms but continues to take Rapaflo.  Most recent PSA 0.2 on 07/24/2022.  He is still extremely pleased that he underwent penile prosthesis implant.  Is working well no issues.  Urinary symptoms are stable, IPSS is below.   Results for orders placed or performed in visit on 08/01/22  Bladder Scan (Post Void Residual) in office  Result Value Ref Range   Scan Result 65ml       IPSS     Row Name 08/01/22 1457         International Prostate Symptom Score   How often have you had the sensation of not emptying your bladder? Less than half the time     How often have you had to urinate less than every two hours? Less than half the time     How often have you found you stopped and started again several times when you urinated? Not at All     How often have you found it difficult to postpone urination? Not at All     How often have you had a weak urinary stream? Not at All     How often have you had to strain to start urination? Less than half the time     How many times did you typically get up at night to urinate? 2 Times     Total IPSS Score 8       Quality of Life due to urinary symptoms   If you were to spend the rest of your life with your urinary condition just the way it is now how would you feel about that? Mixed              Score:  1-7 Mild 8-19 Moderate 20-35 Severe    PMH: Past Medical History:  Diagnosis Date   Allergic rhinitis    Anxiety    Aortic aneurysm and dissection ([place]) 07/2020   Arthritis     Diabetes mellitus without complication (HCC)    GERD (gastroesophageal reflux disease)    Headache    Heart murmur    History of kidney stones    HLD (hyperlipidemia)    Hypertension    Macular degeneration    OSA on CPAP    Paroxysmal atrial fibrillation ([place])    Psoriasis     Surgical History: Past Surgical History:  Procedure Laterality Date   ASCENDING AORTA GRAFT WITH CARDIOPULMONARY BYPASS; VALVE SUSPENSION WITH CORONARY RECONSTRUCTION AND VALVE-SPARING AORTIC ROOT REMODELING (DAVID PROCEDURE, YACOUB PROCEDURE)  08/11/2020   performed at [facility] 07/13/2021   Procedure: COLONOSCOPY;  Surgeon: Annamaria Helling, DO;  Location: [place];  Service: Gastroenterology;  Laterality: N/A;   CYSTOSCOPY WITH INSERTION OF UROLIFT N/A 03/13/2021   Procedure: CYSTOSCOPY WITH INSERTION OF UROLIFT;  Surgeon: Hollice Espy, MD;  Location: [facility] ORS;  Service: Urology;  Laterality: N/A;   EXTRACORPOREAL SHOCK WAVE LITHOTRIPSY Left 11/14/2017   Procedure: EXTRACORPOREAL SHOCK WAVE LITHOTRIPSY (ESWL);  Surgeon: Hollice Espy, MD;  Location: [facility] ORS;  Service:  Urology;  Laterality: Left;   EYE SURGERY     NASAL SINUS SURGERY  1991   PENILE PROSTHESIS IMPLANT  02/2020   TONSILLECTOMY     VASECTOMY      Home Medications:  Allergies as of 08/01/2022       Reactions   Atorvastatin Other (See Comments)   Leg cramps    Pravastatin Other (See Comments)   Leg cramps    Fluvastatin    Other reaction(s): Muscle Pain   Rosuvastatin Other (See Comments)        Medication List        Accurate as of August 01, 2022  3:05 PM. If you have any questions, ask your nurse or doctor.          STOP taking these medications    acetic acid-hydrocortisone OTIC solution Commonly known as: VOSOL-HC Stopped by: Hollice Espy, MD   ezetimibe 10 MG tablet Commonly known as: ZETIA Stopped by: Hollice Espy, MD   folic acid 1 MG  tablet Commonly known as: FOLVITE Stopped by: Hollice Espy, MD   metFORMIN 500 MG tablet Commonly known as: GLUCOPHAGE Stopped by: Hollice Espy, MD   methotrexate 2.5 MG tablet Commonly known as: RHEUMATREX Stopped by: Hollice Espy, MD   mirabegron ER 50 MG Tb24 tablet Commonly known as: MYRBETRIQ Stopped by: Hollice Espy, MD   QC TUMERIC COMPLEX PO Stopped by: Hollice Espy, MD   rivaroxaban 20 MG Tabs tablet Commonly known as: XARELTO Stopped by: Hollice Espy, MD       TAKE these medications    acetaminophen 500 MG tablet Commonly known as: TYLENOL Take 1,000 mg by mouth every 6 (six) hours as needed for moderate pain.   amLODipine 10 MG tablet Commonly known as: NORVASC Take 10 mg by mouth daily.   blood glucose meter kit and supplies Kit Dispense based on patient and insurance preference. Use once daily as directed. (FOR ICD-10 E11.9).   cholecalciferol 1000 units tablet Commonly known as: VITAMIN D Take 1,000 Units by mouth daily.   COSENTYX [place] Inject 1 application into the skin every 30 (thirty) days.   Fish Oil 1000 MG Caps Take 4,000 mg by mouth daily.   fluticasone 50 MCG/ACT nasal spray Commonly known as: FLONASE Place 2 sprays into both nostrils daily as needed for allergies.   latanoprost 0.005 % ophthalmic solution Commonly known as: XALATAN Place 1 drop into both eyes at bedtime.   multivitamin capsule Take 1 capsule by mouth daily.   naproxen sodium 220 MG tablet Commonly known as: ALEVE Take 220 mg by mouth 2 (two) times daily as needed (pain).   omeprazole 20 MG capsule Commonly known as: PRILOSEC TAKE 1 CAPSULE DAILY What changed:  how much to take how to take this when to take this additional instructions   polyethylene glycol powder 17 GM/SCOOP powder Commonly known as: GLYCOLAX/MIRALAX TAKE 17 G BY MOUTH DAILY AS NEEDED FOR MILD CONSTIPATION.   PRESERVISION AREDS 2 PO Take 1 capsule by mouth in the morning  and at bedtime.   silodosin 8 MG Caps capsule Commonly known as: RAPAFLO Take 1 capsule (8 mg total) by mouth daily with breakfast.   SUPER B COMPLEX PO Take 1 tablet by mouth daily.   Uribel 118 MG Caps Take 1 capsule (118 mg total) by mouth 4 (four) times daily as needed.   zolpidem 5 MG tablet Commonly known as: AMBIEN Take 0.5 tablets (2.5 mg total) by mouth at bedtime as needed (sleep).  Allergies:  Allergies  Allergen Reactions   Atorvastatin Other (See Comments)    Leg cramps    Pravastatin Other (See Comments)    Leg cramps    Fluvastatin     Other reaction(s): Muscle Pain   Rosuvastatin Other (See Comments)    Family History: Family History  Problem Relation Age of Onset   Neuropathy Mother    Stroke Mother    Macular degeneration Mother    Diabetes Father    Arthritis Other        Parent   Hypertension Other        Parent   Diabetes Other        Parent   Heart attack Brother    Kidney cancer Brother    Bladder Cancer Neg Hx    Prostate cancer Neg Hx     Social History:  reports that he has never smoked. He has never used smokeless tobacco. He reports current alcohol use of about 2.0 standard drinks of alcohol per week. He reports that he does not use drugs.   Physical Exam: BP (!) 155/75   Pulse 69   Ht 6' (1.829 m)   Wt 270 lb (122.5 kg)   BMI 36.62 kg/m   Constitutional:  Alert and oriented, No acute distress. HEENT: [place] AT, moist mucus membranes.  Trachea midline, no masses. Cardiovascular: No clubbing, cyanosis, or edema. Respiratory: Normal respiratory effort, no increased work of breathing. Neurologic: Grossly intact, no focal deficits, moving all 4 extremities. Psychiatric: Normal mood and affect.  Laboratory Data: Lab Results  Component Value Date   WBC 9.5 08/07/2021   HGB 16.5 08/07/2021   HCT 45.4 08/07/2021   MCV 90.6 08/07/2021   PLT 169 08/07/2021    Lab Results  Component Value Date   CREATININE 0.98  08/07/2021     Assessment & Plan:    1. BPH with obstruction/lower urinary tract symptoms Symptoms stable on [place] dosing, will continue this medication  Status post UroLift procedure overall satisfied with voiding  Adequate bladder emptying  PSA is stable, deferred rectal exam today based on age.  We discussed screening guidelines, given his fairly good health may consider continuing this through the age of 37. - Bladder Scan (Post Void Residual) in office   Follow-up in 1 year with IPSS/PVR/PSA  Hollice Espy, MD  [facility] [address], [place], [place] [address] [phone]

## 2023-01-15 ENCOUNTER — Other Ambulatory Visit
Admission: RE | Admit: 2023-01-15 | Discharge: 2023-01-15 | Disposition: A | Discharge status: Home / Self Care | Payer: Medicare Other | Source: Ambulatory Visit | Attending: Infectious Diseases | Admitting: Infectious Diseases

## 2023-01-15 DIAGNOSIS — R0609 Other forms of dyspnea: Secondary | ICD-10-CM | POA: Diagnosis present

## 2023-01-15 DIAGNOSIS — I480 Paroxysmal atrial fibrillation: Secondary | ICD-10-CM | POA: Diagnosis present

## 2023-01-15 DIAGNOSIS — R0789 Other chest pain: Secondary | ICD-10-CM | POA: Diagnosis present

## 2023-01-15 LAB — BRAIN NATRIURETIC PEPTIDE: B Natriuretic Peptide: 61.3 pg/mL (ref 0.0–100.0)

## 2023-07-30 ENCOUNTER — Other Ambulatory Visit: Payer: Self-pay

## 2023-07-30 ENCOUNTER — Other Ambulatory Visit: Payer: Medicare Other

## 2023-07-30 DIAGNOSIS — N138 Other obstructive and reflux uropathy: Secondary | ICD-10-CM

## 2023-07-31 LAB — PSA: Prostate Specific Ag, Serum: 0.2 ng/mL (ref 0.0–4.0)

## 2023-08-06 ENCOUNTER — Ambulatory Visit (INDEPENDENT_AMBULATORY_CARE_PROVIDER_SITE_OTHER): Payer: Medicare Other | Admitting: Urology

## 2023-08-06 ENCOUNTER — Encounter: Payer: Self-pay | Admitting: Urology

## 2023-08-06 VITALS — BP 146/73 | HR 76

## 2023-08-06 DIAGNOSIS — Z09 Encounter for follow-up examination after completed treatment for conditions other than malignant neoplasm: Secondary | ICD-10-CM | POA: Diagnosis not present

## 2023-08-06 DIAGNOSIS — N411 Chronic prostatitis: Secondary | ICD-10-CM

## 2023-08-06 DIAGNOSIS — N138 Other obstructive and reflux uropathy: Secondary | ICD-10-CM

## 2023-08-06 DIAGNOSIS — Z87438 Personal history of other diseases of male genital organs: Secondary | ICD-10-CM

## 2023-08-06 LAB — BLADDER SCAN AMB NON-IMAGING

## 2023-08-06 MED ORDER — SILODOSIN 8 MG PO CAPS
8.0000 mg | ORAL_CAPSULE | Freq: Every day | ORAL | 3 refills | Status: DC
Start: 2023-08-06 — End: 2024-10-21

## 2023-08-06 NOTE — Progress Notes (Signed)
I,Amy L Pierron,acting as a scribe for Vanna Scotland, MD.,have documented all relevant documentation on the behalf of Vanna Scotland, MD,as directed by  Vanna Scotland, MD while in the presence of Vanna Scotland, MD.  08/06/2023 3:27 PM   Wayne Scott 06/21/1951 [id number]  Referring provider: Mick Sell, MD [address] [place],  [place] [address]   HPI: 72 year-old male with a personal history of chronic prostatitis, irritative urinary symptoms, and BPH presents today for routine annual follow-up.  He underwent UroLift in 02/2021. He had some improvement in his urinary symptoms, primarily obstructive,  but continued taking Rapaflo.   He has a personal history of erectile dysfunction and is status post penile prosthesis.  He has a remote personal history of kidney stones and underwent shockwave in 2018.   His most recent PSA on 07/30/2023 is stable at 0.2  He is taking Rapaflo every night and only has an occasional flare-up.   He has low energy and is trying to monitor his blood sugar and when and what he eats.   IPSS     Row Name 08/06/23 1400         International Prostate Symptom Score   How often have you had the sensation of not emptying your bladder? Less than 1 in 5     How often have you had to urinate less than every two hours? Less than 1 in 5 times     How often have you found you stopped and started again several times when you urinated? Less than 1 in 5 times     How often have you found it difficult to postpone urination? Less than 1 in 5 times     How often have you had a weak urinary stream? Not at All     How often have you had to strain to start urination? Less than 1 in 5 times     How many times did you typically get up at night to urinate? 2 Times     Total IPSS Score 7       Quality of Life due to urinary symptoms   If you were to spend the rest of your life with your urinary condition just the way it is now how would you feel  about that? 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   Arthritis    Diabetes mellitus without complication (HCC)    GERD (gastroesophageal reflux disease)    Headache    Heart murmur    History of kidney stones    HLD (hyperlipidemia)    Hypertension    Macular degeneration    OSA on CPAP    Paroxysmal atrial fibrillation (HCC)    Psoriasis     Surgical History: Past Surgical History:  Procedure Laterality Date   ASCENDING AORTA GRAFT WITH CARDIOPULMONARY BYPASS; VALVE SUSPENSION WITH CORONARY RECONSTRUCTION AND VALVE-SPARING AORTIC ROOT REMODELING (DAVID PROCEDURE, YACOUB PROCEDURE)  08/11/2020   performed at [facility]   CATARACT EXTRACTION     COLONOSCOPY N/A 07/13/2021   Procedure: COLONOSCOPY;  Surgeon: Elfredia Nevins  Casimiro Needle, DO;  Location: [facility];  Service: Gastroenterology;  Laterality: N/A;   CYSTOSCOPY WITH INSERTION OF UROLIFT N/A 03/13/2021   Procedure: CYSTOSCOPY WITH INSERTION OF UROLIFT;  Surgeon: Vanna Scotland, MD;  Location: [facility] ORS;  Service: Urology;  Laterality: N/A;   EXTRACORPOREAL SHOCK WAVE LITHOTRIPSY Left 11/14/2017   Procedure: EXTRACORPOREAL SHOCK WAVE LITHOTRIPSY (ESWL);  Surgeon: Vanna Scotland, MD;  Location: [facility] ORS;  Service: Urology;  Laterality: Left;   EYE SURGERY     NASAL SINUS SURGERY  1991   PENILE PROSTHESIS IMPLANT  02/2020   TONSILLECTOMY     VASECTOMY      Home Medications:  Allergies as of 08/06/2023       Reactions   Atorvastatin Other (See Comments)   Leg cramps    Pravastatin Other (See Comments)   Leg cramps    Fluvastatin    Other reaction(s): Muscle Pain   Rosuvastatin Other (See Comments)         Medication List        Accurate as of August 06, 2023  3:27 PM. If you have any questions, ask your nurse or doctor.          acetaminophen 500 MG tablet Commonly known as: TYLENOL Take 1,000 mg by mouth every 6 (six) hours as needed for moderate pain.   amLODipine 10 MG tablet Commonly known as: NORVASC Take 10 mg by mouth daily.   blood glucose meter kit and supplies Kit Dispense based on patient and insurance preference. Use once daily as directed. (FOR ICD-10 E11.9).   cholecalciferol 1000 units tablet Commonly known as: VITAMIN D Take 1,000 Units by mouth daily.   COSENTYX [place] Inject 1 application into the skin every 30 (thirty) days.   Fish Oil 1000 MG Caps Take 4,000 mg by mouth daily.   fluticasone 50 MCG/ACT nasal spray Commonly known as: FLONASE Place 2 sprays into both nostrils daily as needed for allergies.   latanoprost 0.005 % ophthalmic solution Commonly known as: XALATAN Place 1 drop into both eyes at bedtime.   multivitamin capsule Take 1 capsule by mouth daily.   naproxen sodium 220 MG tablet Commonly known as: ALEVE Take 220 mg by mouth 2 (two) times daily as needed (pain).   omeprazole 20 MG capsule Commonly known as: PRILOSEC TAKE 1 CAPSULE DAILY What changed:  how much to take how to take this when to take this additional instructions   polyethylene glycol powder 17 GM/SCOOP powder Commonly known as: GLYCOLAX/MIRALAX TAKE 17 G BY MOUTH DAILY AS NEEDED FOR MILD CONSTIPATION.   PRESERVISION AREDS 2 PO Take 1 capsule by mouth in the morning and at bedtime.   silodosin 8 MG Caps capsule Commonly known as: RAPAFLO Take 1 capsule (8 mg total) by mouth daily with breakfast.   SUPER B COMPLEX PO Take 1 tablet by mouth daily.   Uribel 118 MG Caps Take 1 capsule (118 mg total) by mouth 4 (four) times daily as needed.   zolpidem 5 MG tablet Commonly known as: AMBIEN Take 0.5 tablets (2.5 mg total) by mouth at bedtime as needed  (sleep).        Allergies:  Allergies  Allergen Reactions   Atorvastatin Other (See Comments)    Leg cramps    Pravastatin Other (See Comments)    Leg cramps    Fluvastatin     Other reaction(s): Muscle Pain   Rosuvastatin Other (See Comments)    Family History: Family History  Problem Relation Age of  Onset   Neuropathy Mother    Stroke Mother    Macular degeneration Mother    Diabetes Father    Arthritis Other        Parent   Hypertension Other        Parent   Diabetes Other        Parent   Heart attack Brother    Kidney cancer Brother    Bladder Cancer Neg Hx    Prostate cancer Neg Hx     Social History:  reports that he has never smoked. He has never used smokeless tobacco. He reports current alcohol use of about 2.0 standard drinks of alcohol per week. He reports that he does not use drugs.   Physical Exam: BP (!) 146/73   Pulse 76   Constitutional:  Alert and oriented, No acute distress. HEENT: [place] AT, moist mucus membranes.  Trachea midline, no masses. Neurologic: Grossly intact, no focal deficits, moving all 4 extremities. Psychiatric: Normal mood and affect.  Assessment & Plan:    BPH with urinary obstruction  - S/p UroLift. Still managed on Rapaflo. Symptoms stable.  - Deferred DRE today due to stable PSA and age.   Return in about 1 year (around 08/05/2024) for IPSS, PSA.  [facility] [address], [address] [place], [place] [address] [phone]

## 2024-03-11 DIAGNOSIS — I4819 Other persistent atrial fibrillation: Secondary | ICD-10-CM | POA: Insufficient documentation

## 2024-03-12 ENCOUNTER — Encounter: Payer: Self-pay | Admitting: Anesthesiology

## 2024-03-12 ENCOUNTER — Ambulatory Visit: Admitting: Anesthesiology

## 2024-03-12 ENCOUNTER — Ambulatory Visit
Admission: RE | Admit: 2024-03-12 | Discharge: 2024-03-12 | Disposition: A | Discharge status: Home / Self Care | Attending: Cardiology | Admitting: Cardiology

## 2024-03-12 ENCOUNTER — Ambulatory Visit
Admission: RE | Admit: 2024-03-12 | Discharge: 2024-03-12 | Disposition: A | Discharge status: Home / Self Care | Source: Home / Self Care | Attending: Student | Admitting: Student

## 2024-03-12 ENCOUNTER — Encounter
Admission: RE | Disposition: A | Discharge status: Home / Self Care | Payer: Self-pay | Source: Home / Self Care | Attending: Cardiology

## 2024-03-12 ENCOUNTER — Ambulatory Visit: Admit: 2024-03-12 | Admitting: Cardiology

## 2024-03-12 DIAGNOSIS — Z7984 Long term (current) use of oral hypoglycemic drugs: Secondary | ICD-10-CM | POA: Insufficient documentation

## 2024-03-12 DIAGNOSIS — I493 Ventricular premature depolarization: Secondary | ICD-10-CM | POA: Diagnosis not present

## 2024-03-12 DIAGNOSIS — G4733 Obstructive sleep apnea (adult) (pediatric): Secondary | ICD-10-CM | POA: Diagnosis not present

## 2024-03-12 DIAGNOSIS — E119 Type 2 diabetes mellitus without complications: Secondary | ICD-10-CM | POA: Insufficient documentation

## 2024-03-12 DIAGNOSIS — K219 Gastro-esophageal reflux disease without esophagitis: Secondary | ICD-10-CM | POA: Insufficient documentation

## 2024-03-12 DIAGNOSIS — I4819 Other persistent atrial fibrillation: Secondary | ICD-10-CM | POA: Insufficient documentation

## 2024-03-12 DIAGNOSIS — I4891 Unspecified atrial fibrillation: Secondary | ICD-10-CM | POA: Diagnosis present

## 2024-03-12 DIAGNOSIS — I10 Essential (primary) hypertension: Secondary | ICD-10-CM | POA: Insufficient documentation

## 2024-03-12 DIAGNOSIS — I480 Paroxysmal atrial fibrillation: Secondary | ICD-10-CM | POA: Insufficient documentation

## 2024-03-12 HISTORY — PX: TEE WITHOUT CARDIOVERSION: SHX5443

## 2024-03-12 HISTORY — PX: CARDIOVERSION: SHX1299

## 2024-03-12 LAB — GLUCOSE, CAPILLARY: Glucose-Capillary: 145 mg/dL — ABNORMAL HIGH (ref 70–99)

## 2024-03-12 LAB — ECHO TEE

## 2024-03-12 SURGERY — CARDIOVERSION
Anesthesia: General

## 2024-03-12 SURGERY — ECHOCARDIOGRAM, TRANSESOPHAGEAL
Anesthesia: General

## 2024-03-12 MED ORDER — PROPOFOL 10 MG/ML IV BOLUS
INTRAVENOUS | Status: AC
  Filled 2024-03-12: qty 20

## 2024-03-12 MED ORDER — DEXMEDETOMIDINE HCL IN NACL 400 MCG/100ML IV SOLN
INTRAVENOUS | Status: DC | PRN
Start: 2024-03-12 — End: 2024-03-12
  Administered 2024-03-12: 4 ug via INTRAVENOUS

## 2024-03-12 MED ORDER — LIDOCAINE HCL URETHRAL/MUCOSAL 2 % EX GEL
CUTANEOUS | Status: AC
  Filled 2024-03-12: qty 5

## 2024-03-12 MED ORDER — PROPOFOL 1000 MG/100ML IV EMUL
INTRAVENOUS | Status: AC
  Filled 2024-03-12: qty 100

## 2024-03-12 MED ORDER — BUTAMBEN-TETRACAINE-BENZOCAINE 2-2-14 % EX AERO
INHALATION_SPRAY | CUTANEOUS | Status: AC
  Administered 2024-03-12: 2
  Filled 2024-03-12: qty 5

## 2024-03-12 MED ORDER — PROPOFOL 500 MG/50ML IV EMUL
INTRAVENOUS | Status: DC | PRN
  Administered 2024-03-12: 100 ug/kg/min via INTRAVENOUS

## 2024-03-12 MED ORDER — FENTANYL CITRATE (PF) 100 MCG/2ML IJ SOLN
INTRAMUSCULAR | Status: DC | PRN
  Administered 2024-03-12: 50 ug via INTRAVENOUS

## 2024-03-12 MED ORDER — DEXMEDETOMIDINE HCL IN NACL 80 MCG/20ML IV SOLN
INTRAVENOUS | Status: AC
  Filled 2024-03-12: qty 20

## 2024-03-12 MED ORDER — FENTANYL CITRATE (PF) 100 MCG/2ML IJ SOLN
INTRAMUSCULAR | Status: AC
Start: 2024-03-12 — End: ?
  Filled 2024-03-12: qty 2

## 2024-03-12 MED ORDER — LIDOCAINE HCL (PF) 2 % IJ SOLN
INTRAMUSCULAR | Status: AC
  Filled 2024-03-12: qty 5

## 2024-03-12 MED ORDER — LIDOCAINE VISCOUS HCL 2 % MT SOLN
OROMUCOSAL | Status: AC
Start: 2024-03-12 — End: 2024-03-12
  Filled 2024-03-12: qty 15

## 2024-03-12 MED ORDER — PHENYLEPHRINE 80 MCG/ML (10ML) SYRINGE FOR IV PUSH (FOR BLOOD PRESSURE SUPPORT)
PREFILLED_SYRINGE | INTRAVENOUS | Status: AC
  Filled 2024-03-12: qty 10

## 2024-03-12 MED ORDER — SODIUM CHLORIDE 0.9 % IV SOLN: INTRAVENOUS | Status: DC

## 2024-03-12 NOTE — Anesthesia Preprocedure Evaluation (Addendum)
 Anesthesia Evaluation  Patient identified by MRN, date of birth, ID band Patient awake    Reviewed: Allergy & Precautions, H&P , NPO status , Patient's Chart, lab work & pertinent test results, reviewed documented beta blocker date and time   Airway Mallampati: III   Neck ROM: full    Dental  (+) Teeth Intact   Pulmonary shortness of breath, sleep apnea and Continuous Positive Airway Pressure Ventilation    Pulmonary exam normal        Cardiovascular Exercise Tolerance: Good hypertension, On Medications, Pt. on medications and Pt. on home beta blockers + dysrhythmias Atrial Fibrillation + Valvular Problems/Murmurs (repair of dissecting thoracic aortic aneurysm, Stanford type A 08/24/2020 )  Rhythm:regular Rate:Normal  S/p thoracic aneurysm repair   Neuro/Psych  Headaches  Anxiety     TIA negative psych ROS   GI/Hepatic Neg liver ROS,GERD  Medicated and Controlled,,  Endo/Other  diabetes, Oral Hypoglycemic Agents    Renal/GU negative Renal ROS  negative genitourinary   Musculoskeletal  (+) Arthritis , Osteoarthritis,    Abdominal   Peds  Hematology negative hematology ROS (+)   Anesthesia Other Findings 73 y.o. 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   Hx of repair of dissecting thoracic aortic aneurysm, Stanford type A 08/24/2020  Ascending aortic dissection, hemi-arch repair of Type A Dissection on 08/11/2020   Hypertension   Kidney stone   Macular degeneration   OSA (obstructive sleep apnea)  08/11/2020   Prediabetes   Psoriasis   Sleep apnea     Reproductive/Obstetrics negative OB ROS                             Anesthesia Physical Anesthesia Plan  ASA: 3  Anesthesia Plan: General   Post-op Pain Management:    Induction: Intravenous  PONV Risk Score and Plan: 3 and Propofol infusion and TIVA  Airway Management Planned: Natural Airway and Simple Face Mask  Additional Equipment:   Intra-op Plan:   Post-operative Plan:   Informed Consent: I have reviewed the patients History and Physical, chart, labs and discussed the procedure including the risks, benefits and alternatives for the proposed anesthesia with the patient or authorized representative who has indicated his/her understanding and acceptance.     Dental Advisory Given  Plan Discussed with: CRNA  Anesthesia Plan Comments:         Anesthesia Quick Evaluation

## 2024-03-12 NOTE — Progress Notes (Signed)
*  PRELIMINARY RESULTS* Echocardiogram Echocardiogram Transesophageal has been performed.  Wayne Scott 03/12/2024, 12:57 PM

## 2024-03-12 NOTE — Transfer of Care (Signed)
 Immediate Anesthesia Transfer of Care Note  Patient: Wayne Scott  Procedure(s) Performed: ECHOCARDIOGRAM, TRANSESOPHAGEAL CARDIOVERSION  Patient Location: PACU  Anesthesia Type:MAC  Level of Consciousness: sedated  Airway & Oxygen Therapy: Patient Spontanous Breathing and Patient connected to face mask oxygen  Post-op Assessment: Report given to RN and Post -op Vital signs reviewed and stable  Post vital signs: stable  Last Vitals:  Vitals Value Taken Time  BP 111/86   Temp    Pulse 75   Resp 16   SpO2 96     Last Pain:  Vitals:   03/12/24 1147  TempSrc: Oral  PainSc: 0-No pain         Complications: No notable events documented.

## 2024-03-12 NOTE — Procedures (Signed)
 Electrical Cardioversion Procedure Note  Indication: Atrial Fibrillation  Procedure Details: Consent: Indication, Risk/benefits of procedure as well as the alternatives explained to patient and informed consent obtained. Time out performed. Verified patient identification, verified procedure, verified correct patient position, special equipment/implants available, medications/allergies/relevent history reviewed, required imaging and test results reviewed.  Deep sedation was provided by anesthesia with propofol. Patient was delivered with 200 Joules of electricity X 1 with success to Sinus rhythm. Patient tolerated the procedure well. No immediate complication noted.   Successful cardioversion  Windell Norfolk, MD [facility]

## 2024-03-13 ENCOUNTER — Encounter: Payer: Self-pay | Admitting: Cardiology

## 2024-03-16 NOTE — Addendum Note (Signed)
 Addendum  created 03/16/24 1004 by Baltazar Bonier, MD   Clinical Note Signed

## 2024-03-16 NOTE — Anesthesia Postprocedure Evaluation (Addendum)
 Anesthesia Post Note  Patient: Wayne Scott  Procedure(s) Performed: ECHOCARDIOGRAM, TRANSESOPHAGEAL CARDIOVERSION  Patient location during evaluation: PACU Anesthesia Type: General Level of consciousness: awake and alert Pain management: pain level controlled Vital Signs Assessment: post-procedure vital signs reviewed and stable Respiratory status: spontaneous breathing, nonlabored ventilation and respiratory function stable Cardiovascular status: blood pressure returned to baseline and stable Postop Assessment: no apparent nausea or vomiting Anesthetic complications: no   No notable events documented.   Last Vitals:  Vitals:   03/12/24 1330 03/12/24 1345  BP: 116/82 124/86  Pulse: 66 64  Resp: (!) 21 18  Temp:    SpO2: 96% 96%    Last Pain:  Vitals:   03/12/24 1345  TempSrc:   PainSc: 0-No pain                 Baltazar Bonier

## 2024-03-23 ENCOUNTER — Encounter: Admission: RE | Payer: Self-pay | Source: Home / Self Care

## 2024-03-23 ENCOUNTER — Ambulatory Visit: Admission: RE | Admit: 2024-03-23 | Source: Home / Self Care | Admitting: Cardiology

## 2024-03-23 SURGERY — CARDIOVERSION
Anesthesia: General

## 2024-07-09 ENCOUNTER — Encounter: Payer: Self-pay | Admitting: Urology

## 2024-07-28 ENCOUNTER — Other Ambulatory Visit: Payer: Self-pay | Admitting: *Deleted

## 2024-07-28 DIAGNOSIS — N138 Other obstructive and reflux uropathy: Secondary | ICD-10-CM

## 2024-07-28 DIAGNOSIS — N411 Chronic prostatitis: Secondary | ICD-10-CM

## 2024-07-28 DIAGNOSIS — Z125 Encounter for screening for malignant neoplasm of prostate: Secondary | ICD-10-CM

## 2024-07-29 ENCOUNTER — Other Ambulatory Visit: Payer: Self-pay

## 2024-07-29 DIAGNOSIS — N411 Chronic prostatitis: Secondary | ICD-10-CM

## 2024-07-29 DIAGNOSIS — N138 Other obstructive and reflux uropathy: Secondary | ICD-10-CM

## 2024-07-30 LAB — PSA: Prostate Specific Ag, Serum: 0.4 ng/mL (ref 0.0–4.0)

## 2024-07-30 NOTE — Progress Notes (Signed)
   08/06/2024 2:27 PM   Wayne Scott [birth year] [id number]  Reason for visit: Follow up BPH LUTS   HPI: Initial visit with me today, previously followed in this clinic by Dr. Penne Patient doing very well, no interval changes to GU history Content with urinary habits, continues on Rapaflo , good effect no side effects Denies hospitalizations, had 2 eye surgeries uncomplicated, no UTIs, no gross hematuria  Prior HPI: Previously followed by Dr. Penne, last seen in September 2024 History of prostatitis, BPH LUTS -s/p UroLift in April 2022 History of ED-s/p IPP History of nephrolithiasis-ESWL in 2018  Physical Exam: BP (!) 159/77 (BP Location: Left Arm, Patient Position: Sitting, Cuff Size: Large)   Pulse 75   Ht 6' (1.829 m)   Wt 264 lb (119.7 kg)   BMI 35.80 kg/m    Constitutional:  Alert and oriented, No acute distress.   Laboratory Data: Component Ref Range & Units (hover) 8 d ago (07/29/24) 1 yr ago (07/30/23) 2 yr ago (07/24/22) 3 yr ago (01/26/21) 4 yr ago (01/20/20) 4 yr ago (11/12/19) 5 yr ago (01/21/19)  Prostate Specific Ag, Serum 0.4 0.2 CM 0.2 CM 0.2 CM 0.2 CM 0.2 CM 0.2 CM    Pertinent Imaging: N/A   Assessment & Plan:    Benign prostatic hyperplasia, unspecified whether lower urinary tract symptoms present Assessment & Plan: S/p Urolift in 2022 On Rapaflo  PVR 88 cc, IPPS 8/2 today   Overall patient doing very well, content with urinary habits Adjustments needed at this time Return to clinic in 1 year for checkup  Orders: -     Bladder Scan (Post Void Residual) in office       Penne JONELLE Skye, MD  [facility] [address], [address] [place], [place] [address] [phone]

## 2024-07-30 NOTE — Assessment & Plan Note (Addendum)
 S/p Urolift in 2022 On Rapaflo  PVR 88 cc, IPPS 8/2 today   Overall patient doing very well, content with urinary habits Adjustments needed at this time Return to clinic in 1 year for checkup

## 2024-08-05 ENCOUNTER — Ambulatory Visit: Payer: Self-pay | Admitting: Urology

## 2024-08-06 ENCOUNTER — Ambulatory Visit: Admitting: Urology

## 2024-08-06 VITALS — BP 159/77 | HR 75 | Ht 72.0 in | Wt 264.0 lb

## 2024-08-06 DIAGNOSIS — N400 Enlarged prostate without lower urinary tract symptoms: Secondary | ICD-10-CM

## 2024-08-06 LAB — BLADDER SCAN AMB NON-IMAGING

## 2024-10-21 ENCOUNTER — Other Ambulatory Visit: Payer: Self-pay

## 2024-10-21 DIAGNOSIS — N411 Chronic prostatitis: Secondary | ICD-10-CM

## 2024-10-21 MED ORDER — SILODOSIN 8 MG PO CAPS: 8.0000 mg | ORAL_CAPSULE | Freq: Every day | ORAL | 3 refills | Status: AC

## 2025-08-06 ENCOUNTER — Ambulatory Visit: Admitting: Urology
